# Patient Record
Sex: Male | Born: 1960
Health system: Southern US, Community
[De-identification: ages and names within clinical notes are randomized; demographics above are authoritative.]

## PROBLEM LIST (undated history)

## (undated) DIAGNOSIS — R430 Anosmia: Secondary | ICD-10-CM

## (undated) DIAGNOSIS — E669 Obesity, unspecified: Secondary | ICD-10-CM

## (undated) DIAGNOSIS — H4310 Vitreous hemorrhage, unspecified eye: Secondary | ICD-10-CM

## (undated) DIAGNOSIS — I10 Essential (primary) hypertension: Secondary | ICD-10-CM

## (undated) HISTORY — PX: NECK SURGERY: SHX720

## (undated) HISTORY — DX: Anosmia: R43.0

## (undated) HISTORY — PX: VASECTOMY: SHX75

## (undated) HISTORY — DX: Obesity, unspecified: E66.9

---
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
## 1999-05-24 ENCOUNTER — Other Ambulatory Visit: Admission: RE | Admit: 1999-05-24 | Discharge: 1999-05-24 | Payer: Self-pay | Admitting: Otolaryngology

## 1999-05-30 ENCOUNTER — Other Ambulatory Visit: Admission: RE | Admit: 1999-05-30 | Discharge: 1999-05-30 | Payer: Self-pay | Admitting: Otolaryngology

## 1999-06-27 ENCOUNTER — Ambulatory Visit (HOSPITAL_BASED_OUTPATIENT_CLINIC_OR_DEPARTMENT_OTHER): Admission: RE | Admit: 1999-06-27 | Discharge: 1999-06-27 | Payer: Self-pay | Admitting: Otolaryngology

## 2011-12-28 ENCOUNTER — Emergency Department
Admit: 2011-12-28 | Discharge: 2011-12-28 | Disposition: A | Discharge status: Home / Self Care | Payer: BC Managed Care – PPO

## 2011-12-28 ENCOUNTER — Encounter: Payer: Self-pay | Admitting: *Deleted

## 2011-12-28 ENCOUNTER — Emergency Department (INDEPENDENT_AMBULATORY_CARE_PROVIDER_SITE_OTHER)
Admission: EM | Admit: 2011-12-28 | Discharge: 2011-12-28 | Disposition: A | Discharge status: Home Health | Payer: BC Managed Care – PPO | Source: Home / Self Care | Attending: Family Medicine | Admitting: Family Medicine

## 2011-12-28 DIAGNOSIS — R05 Cough: Secondary | ICD-10-CM

## 2011-12-28 DIAGNOSIS — R059 Cough, unspecified: Secondary | ICD-10-CM

## 2011-12-28 DIAGNOSIS — R053 Chronic cough: Secondary | ICD-10-CM

## 2011-12-28 HISTORY — DX: Essential (primary) hypertension: I10

## 2011-12-28 MED ORDER — HYDROCOD POLST-CHLORPHEN POLST 10-8 MG/5ML PO LQCR: ORAL | Status: DC

## 2011-12-28 MED ORDER — PREDNISONE 10 MG PO TABS: ORAL_TABLET | ORAL | Status: DC

## 2011-12-28 MED ORDER — CLARITHROMYCIN 500 MG PO TABS: 500.0000 mg | ORAL_TABLET | Freq: Two times a day (BID) | ORAL | Status: AC

## 2011-12-28 NOTE — ED Provider Notes (Signed)
History     CSN: [id number]  Arrival date & time 12/28/11  1512   First MD Initiated Contact with Patient 12/28/11 1628      Chief Complaint  Patient presents with  . Cough     HPI Comments: Patient recalls developing nausea/vomiting and diarrhea that lasted 3 days, about 6 weeks ago.  This was followed by a cough without sore throat or nasal congestion.  He visited his PCP who started him on a Z-pack and codeine cough suppressant at bedtime.  He also added Tessalon during the day.  The patient's cough has persisted, is non-productive, and worse at night.  He has had no pleuritic pain but has tightness in the anterior chest.  He occasionally coughs until he gags.  No pleuritic pain or shortness of breath.  No lower leg edema.  He has not had a tetanus shot since 2006.  He notes that he has been taking Lisinopril for hypertension for about 9 years  Patient is a 51 y.o. male presenting with cough. The history is provided by the patient.  Cough This is a new problem. Episode onset: 6 weeks ago. The problem occurs constantly. The problem has been gradually worsening. The cough is non-productive. There has been no fever. Pertinent negatives include no chest pain, no chills, no sweats, no weight loss, no ear congestion, no ear pain, no headaches, no rhinorrhea, no sore throat, no myalgias, no shortness of breath, no wheezing and no eye redness. He has tried cough syrup for the symptoms. The treatment provided no relief. He is not a smoker.    Past Medical History  Diagnosis Date  . Hypertension     History reviewed. No pertinent past surgical history.  Family History  Problem Relation Age of Onset  . Cancer Mother     breast  . Cancer Father     prostate    History  Substance Use Topics  . Smoking status: Never Smoker   . Smokeless tobacco: Not on file  . Alcohol Use: No      Review of Systems  Constitutional: Negative for chills and weight loss.  HENT: Negative for ear pain, sore throat and rhinorrhea.   Eyes: Negative for  redness.  Respiratory: Positive for cough. Negative for shortness of breath and wheezing.   Cardiovascular: Negative for chest pain.  Musculoskeletal: Negative for myalgias.  Neurological: Negative for headaches.  All other systems reviewed and are negative.    Allergies  Review of patient's allergies indicates no known allergies.  Home Medications   Current Outpatient Rx  Name Route Sig Dispense Refill  . LISINOPRIL-HYDROCHLOROTHIAZIDE 10-12.5 MG PO TABS Oral Take 1 tablet by mouth daily.    Marland Kitchen HYDROCOD POLST-CPM POLST ER 10-8 MG/5ML PO LQCR  Take 5mL by mouth at bedtime as needed for cough 115 mL 0  . CLARITHROMYCIN 500 MG PO TABS Oral Take 1 tablet (500 mg total) by mouth 2 (two) times daily. Take for one week 14 tablet 0  . PREDNISONE 10 MG PO TABS  Take 2 tabs by mouth today, then 2 twice daily for three days, then one tab twice daily for 2 days, then 1 daily for two days.  Take PC 20 tablet 0    BP 117/83  Pulse 103  Temp(Src) 98.5 F (36.9 C) (Oral)  Resp 18  Ht 5' 8.5" (1.74 m)  Wt 249 lb 12 oz (113.286 kg)  BMI 37.42 kg/m2  SpO2 98%  Physical Exam Nursing notes and Vital Signs reviewed. Appearance:  Patient appears healthy, stated age, and in no acute distress Eyes:  Pupils are equal, round, and reactive to light and accomodation.  Extraocular movement is intact.  Conjunctivae are not inflamed  Ears:  Canals normal.  Tympanic membranes normal.  Nose:  Mildly congested turbinates.  No sinus tenderness.  Pharynx:  Normal Neck:  Supple.  No adenopathy Lungs:  Clear to auscultation.  Breath sounds are equal.  Heart:  Regular rate and rhythm without murmurs, rubs, or gallops.  Abdomen:  Nontender without masses or hepatosplenomegaly.  Bowel sounds are present.  No CVA or flank tenderness.  Extremities:  No edema.  No calf tenderness Skin:  No rash present.   ED Course  Procedures  none   Dg Chest 2 View  12/28/2011  *RADIOLOGY REPORT*  Clinical Data: 6 weeks of  cough.  CHEST - 2 VIEW  Comparison: None.  Findings: Film is made with shallow lung inflation.  Heart size is normal.  There are no focal consolidations or pleural effusions. No edema. Visualized osseous structures have a normal appearance.  IMPRESSION: No evidence for acute cardiopulmonary abnormality.  Original Report Authenticated By: Patterson Hammersmith, M.D.     1. Persistent cough; ? Pertussis; ? ACEI induced      MDM   Begin Biaxin for one week, Tussionex at bedtime, tapering course of prednisone Begin Mucinex (guaifenesin) with plenty of fluids. Followup with PCP in one week.       Donna Christen, MD 12/28/11 [phone]

## 2011-12-28 NOTE — ED Notes (Signed)
Pt c/o dry cough x 6wks.  He saw his PCP 11/13/11 and was given Zpak with no relief. He has also taken delsym, codeine cough syrup and tessalon perles with no relief. He states that he also cough up some blood x 2wks ago. He c/o cough is worse with talking.

## 2011-12-28 NOTE — Discharge Instructions (Signed)
Begin Mucinex (guaifenesin) with plenty of fluids.

## 2011-12-30 ENCOUNTER — Telehealth: Payer: Self-pay

## 2011-12-31 ENCOUNTER — Emergency Department: Admission: EM | Admit: 2011-12-31 | Discharge: 2011-12-31 | Discharge status: Absent without leave | Payer: Self-pay

## 2013-03-10 LAB — HM COLONOSCOPY: HM COLON: NORMAL

## 2015-12-28 ENCOUNTER — Telehealth: Payer: Self-pay

## 2015-12-28 NOTE — Telephone Encounter (Signed)
Reason for call: Pt says that he has a friend that is a current pt of yours. He says that he has heard really good things about you and would like to know if he can be a new pt?    Please advise.   CB: [phone]

## 2015-12-28 NOTE — Telephone Encounter (Signed)
FYI. Please inform Pt to have medical records sent before appt or to bring them w/ him to appt. Thank you.

## 2015-12-28 NOTE — Telephone Encounter (Signed)
Please arrange a office visit at his earliest convenience

## 2015-12-29 NOTE — Telephone Encounter (Signed)
Pt has been scheduled. Pt says that he can bring in medical records.

## 2016-01-04 ENCOUNTER — Other Ambulatory Visit: Payer: Self-pay

## 2016-03-07 DIAGNOSIS — B344 Papovavirus infection, unspecified: Secondary | ICD-10-CM | POA: Diagnosis not present

## 2016-04-16 ENCOUNTER — Telehealth: Payer: Self-pay

## 2016-04-16 NOTE — Telephone Encounter (Signed)
Pre visit call made requested return call.

## 2016-04-17 ENCOUNTER — Ambulatory Visit (INDEPENDENT_AMBULATORY_CARE_PROVIDER_SITE_OTHER): Payer: BLUE CROSS/BLUE SHIELD | Admitting: Internal Medicine

## 2016-04-17 ENCOUNTER — Encounter: Payer: Self-pay | Admitting: Internal Medicine

## 2016-04-17 VITALS — BP 126/80 | HR 71 | Temp 98.1°F | Ht 69.0 in | Wt 267.0 lb

## 2016-04-17 DIAGNOSIS — I10 Essential (primary) hypertension: Secondary | ICD-10-CM | POA: Diagnosis not present

## 2016-04-17 DIAGNOSIS — E669 Obesity, unspecified: Secondary | ICD-10-CM | POA: Diagnosis not present

## 2016-04-17 DIAGNOSIS — R739 Hyperglycemia, unspecified: Secondary | ICD-10-CM

## 2016-04-17 NOTE — Patient Instructions (Signed)
GO TO THE LAB : Get the blood work     GO TO THE FRONT DESK Schedule your next appointment for a  Physical exam in 2 months fasting  MYFITNESSPAL -- Great calorie counting app    Check the  blood pressure 2 or 3 times a month   Be sure your blood pressure is between 110/65 and  145/85. If it is consistently higher or lower, let me know

## 2016-04-17 NOTE — Progress Notes (Signed)
Pre visit review using our clinic review tool, if applicable. No additional management support is needed unless otherwise documented below in the visit note. 

## 2016-04-17 NOTE — Progress Notes (Signed)
Subjective:    Patient ID: Christian Ramos, male    DOB: [birth year], 55 y.o.   MRN: [id number]  DOS:  04/17/2016 Type of visit - description : New patient, to get established, previously seen at [facility] Interval history: Here to get established, several concerns Obesity: Has been obese most of his adult life, many years ago he weighed as much as 240 pounds, was able to decreased to 195 pounds with diet and exercise. Then for many years his weight went up and down. For the last 10 years steadily gaining weight. The last serious attempt to lose weight was 3 years ago with diet and exercise and he was not very  successful.  HTN: Good compliance of medications, ambulatory BPs excellent, 110, 120. Very seldom it goes ~ 150.  + Stress, work related, family life is great.  Also concerned about sleep apnea, has noted to snore, he used to be very energetic for for the last 2 years his energy has decreased.  Labs 01/15/2015: BMP normal except for a blood sugar of 118. A1c 5.3 Total cholesterol 235, TG 373, LDL 34, LDL 126 Hep C screening-2015 negative  Review of Systems   Past Medical History  Diagnosis Date  . Hypertension     Past Surgical History  Procedure Laterality Date  . Vasectomy  ~2000  . Neck surgery  ~ 2000    mass removed anteriorly, Dr Cloria Spring    Social History   Social History  . Marital Status: Married    Spouse Name: N/A  . Number of Children: 2  . Years of Education: N/A   Occupational History  . 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 Smoking status: Never Smoker   . Smokeless tobacco: Not on file  . Alcohol Use: 0.0 oz/week    0 Standard drinks or equivalent per week     Comment: Social  . Drug Use: No  . Sexual Activity: Not on file   Other Topics Concern  . Not on file   Social History Narrative   2 children, both in college     Family History  Problem Relation Age of Onset  . Breast cancer Mother    lumpectomy, XRT, chemo  . Prostate cancer Father     ~ 78 y/o  . Heart disease Father     MI   . Diabetes Mother   . Hypertension Mother       Medication List       This list is accurate as of: 04/17/16 11:59 PM.  Always use your most recent med list.               loratadine 10 MG tablet  Commonly known as:  CLARITIN  Take 10 mg by mouth daily.     losartan-hydrochlorothiazide 100-12.5 MG tablet  Commonly known as:  HYZAAR  Take 1 tablet by mouth daily.     multivitamin tablet  Take 1 tablet by mouth daily.           Objective:   Physical Exam BP 126/80 mmHg  Pulse 71  Temp(Src) 98.1 F (36.7 C) (Oral)  Ht  (1.753 m)  Wt 267 lb (121.11 kg)  BMI 39.41 kg/m2  SpO2 97%  General:   Well developed, well nourished . NAD.  Neck: No  thyromegaly . Well-healed surgical scar and tumor neck. HEENT:  Normocephalic . Face symmetric, atraumatic Lungs:  CTA B Normal respiratory effort, no intercostal  retractions, no accessory muscle use. Heart: RRR,  no murmur.  No pretibial edema bilaterally  Abdomen:  Not distended, soft, non-tender. No rebound or rigidity.   Skin: Exposed areas without rash. Not pale. Not jaundice Neurologic:  alert & oriented X3.  Speech normal, gait appropriate for age and unassisted Strength symmetric and appropriate for age.  Psych: Cognition and judgment appear intact.  Cooperative with normal attention span and concentration.  Behavior appropriate. No anxious or depressed appearing.    Assessment & Plan:   Assessment HTN  Obesity OSA?  Plan: HTN: Continue Hyzaar, seems well-controlled, check a CMP, CBC Obesity: Has been obese most of his adult life, current BMI 39.he also has evidence of sleep apnea on clinical grounds, epworht scale was 14 which is high. We'll get a TSH, A1c. Diet-- strongly encourage calorie counting. Gradual exercise program. Refer for a sleep study in the near future if unable to loose wt. Mild  hyperglycemia, see labs above, recheck a A1c + Stress, work related. Reassess on RTC. Get records from previous PCP RTC 2 months CPX  Today, I spent more than  32  min with the patient: >50% of the time counseling regards diet, exercise, risks of untreated sleep apnea.

## 2016-04-18 ENCOUNTER — Encounter: Payer: Self-pay | Admitting: Internal Medicine

## 2016-04-18 DIAGNOSIS — I10 Essential (primary) hypertension: Secondary | ICD-10-CM | POA: Insufficient documentation

## 2016-04-18 DIAGNOSIS — Z09 Encounter for follow-up examination after completed treatment for conditions other than malignant neoplasm: Secondary | ICD-10-CM | POA: Insufficient documentation

## 2016-04-18 DIAGNOSIS — E669 Obesity, unspecified: Secondary | ICD-10-CM | POA: Insufficient documentation

## 2016-04-18 LAB — COMPREHENSIVE METABOLIC PANEL
ALK PHOS: 52 U/L (ref 39–117)
ALT: 27 U/L (ref 0–53)
AST: 20 U/L (ref 0–37)
Albumin: 4.1 g/dL (ref 3.5–5.2)
BILIRUBIN TOTAL: 0.9 mg/dL (ref 0.2–1.2)
BUN: 13 mg/dL (ref 6–23)
CALCIUM: 9.2 mg/dL (ref 8.4–10.5)
CO2: 27 meq/L (ref 19–32)
CREATININE: 0.85 mg/dL (ref 0.40–1.50)
Chloride: 101 mEq/L (ref 96–112)
GFR: 99.67 mL/min (ref >60.00)
Glucose, Bld: 72 mg/dL (ref 70–99)
Potassium: 3.6 mEq/L (ref 3.5–5.1)
Sodium: 138 mEq/L (ref 135–145)
TOTAL PROTEIN: 7 g/dL (ref 6.0–8.3)

## 2016-04-18 LAB — CBC WITH DIFFERENTIAL/PLATELET
BASOS PCT: 1.1 % (ref 0.0–3.0)
Basophils Absolute: 0.1 10*3/uL (ref 0.0–0.1)
EOS ABS: 0.2 10*3/uL (ref 0.0–0.7)
Eosinophils Relative: 2.3 % (ref 0.0–5.0)
HEMATOCRIT: 44 % (ref 39.0–52.0)
Hemoglobin: 15.1 g/dL (ref 13.0–17.0)
LYMPHS PCT: 30.2 % (ref 12.0–46.0)
Lymphs Abs: 2.3 10*3/uL (ref 0.7–4.0)
MCHC: 34.3 g/dL (ref 30.0–36.0)
MCV: 90.6 fl (ref 78.0–100.0)
MONOS PCT: 8.5 % (ref 3.0–12.0)
Monocytes Absolute: 0.6 10*3/uL (ref 0.1–1.0)
NEUTROS ABS: 4.4 10*3/uL (ref 1.4–7.7)
Neutrophils Relative %: 57.9 % (ref 43.0–77.0)
PLATELETS: 279 10*3/uL (ref 150.0–400.0)
RBC: 4.86 Mil/uL (ref 4.22–5.81)
RDW: 12.8 % (ref 11.5–15.5)
WBC: 7.5 10*3/uL (ref 4.0–10.5)

## 2016-04-18 LAB — TSH: TSH: 0.91 u[IU]/mL (ref 0.35–4.50)

## 2016-04-18 LAB — HEMOGLOBIN A1C: Hgb A1c MFr Bld: 5.4 % (ref 4.6–6.5)

## 2016-04-18 NOTE — Assessment & Plan Note (Signed)
HTN: Continue Hyzaar, seems well-controlled, check a CMP, CBC Obesity: Has been obese most of his adult life, current BMI 39.he also has evidence of sleep apnea on clinical grounds, epworht scale was 14 which is high. We'll get a TSH, A1c. Diet-- strongly encourage calorie counting. Gradual exercise program. Refer for a sleep study in the near future if unable to loose wt. Mild hyperglycemia, see labs above, recheck a A1c + Stress, work related. Reassess on RTC. Get records from previous PCP RTC 2 months CPX

## 2016-04-26 ENCOUNTER — Encounter: Payer: Self-pay | Admitting: Internal Medicine

## 2016-04-26 MED ORDER — LOSARTAN POTASSIUM-HCTZ 100-12.5 MG PO TABS: 1.0000 | ORAL_TABLET | Freq: Every day | ORAL | Status: DC

## 2016-04-26 NOTE — Telephone Encounter (Signed)
Hyzaar sent to [facility].

## 2016-05-30 DIAGNOSIS — B354 Tinea corporis: Secondary | ICD-10-CM | POA: Diagnosis not present

## 2016-07-09 ENCOUNTER — Ambulatory Visit (INDEPENDENT_AMBULATORY_CARE_PROVIDER_SITE_OTHER): Payer: BLUE CROSS/BLUE SHIELD | Admitting: Internal Medicine

## 2016-07-09 ENCOUNTER — Encounter: Payer: Self-pay | Admitting: Internal Medicine

## 2016-07-09 VITALS — BP 124/80 | HR 79 | Temp 98.1°F | Resp 14 | Ht 69.0 in | Wt 265.2 lb

## 2016-07-09 DIAGNOSIS — R51 Headache: Secondary | ICD-10-CM

## 2016-07-09 DIAGNOSIS — Z0000 Encounter for general adult medical examination without abnormal findings: Secondary | ICD-10-CM | POA: Insufficient documentation

## 2016-07-09 DIAGNOSIS — R430 Anosmia: Secondary | ICD-10-CM

## 2016-07-09 DIAGNOSIS — R519 Headache, unspecified: Secondary | ICD-10-CM

## 2016-07-09 LAB — LIPID PANEL
CHOL/HDL RATIO: 5
Cholesterol: 216 mg/dL — ABNORMAL HIGH (ref 0–200)
HDL: 42 mg/dL (ref >39.00)
NONHDL: 174.39
TRIGLYCERIDES: 270 mg/dL — AB (ref 0.0–149.0)
VLDL: 54 mg/dL — AB (ref 0.0–40.0)

## 2016-07-09 LAB — PSA: PSA: 0.4 ng/mL (ref 0.10–4.00)

## 2016-07-09 LAB — LDL CHOLESTEROL, DIRECT: Direct LDL: 120 mg/dL

## 2016-07-09 NOTE — Assessment & Plan Note (Addendum)
Td 2014 Flu shot- benefits discussed, declined CCS: per [facility]  cscope 03-10-2013  (normal per pt) Prostate cancer screening: DRE normal today, + FH of prostate cancer, checking a PSA Labs reviewed, due for FLP and PSA EKG today for baseline: NSR, old anterior infarct?[facility]. We'll try again to get old records but EKG is not acute. Diet discussed

## 2016-07-09 NOTE — Progress Notes (Signed)
Subjective:    Patient ID: Christian Ramos, male    DOB: 1961/04/24, 55 y.o.   MRN: [id number]  DOS:  07/09/2016 Type of visit - description : CPX Interval history: Still concerned about his weight. Also 3-5 weeks history of a "HA": Pain starts at the right side of the neck and radiate upwards ,on and off, no associated with nausea, vomiting, light/noise intolerance. It decrease shortly after he takes a OTC. Denies any injury, pain is not triggered by head motion. Also concerned because for the last month, he has lost his sense of  smell. No recent URI.   Wt Readings from Last 3 Encounters:  07/09/16 265 lb 4 oz (120.3 kg)  04/17/16 267 lb (121.1 kg)  12/28/11 249 lb 12 oz (113.3 kg)      Review of Systems Constitutional: No fever. No chills. No unexplained wt changes. No unusual sweats  HEENT: No dental problems, no ear discharge, no facial swelling, no voice changes. No eye discharge, no eye  redness , no  intolerance to light   Respiratory: No wheezing , no  difficulty breathing. No cough , no mucus production  Cardiovascular: No CP, no leg swelling , no  Palpitations  GI: no nausea, no vomiting, no diarrhea , no  abdominal pain.  No blood in the stools. No dysphagia, no odynophagia    Endocrine: No polyphagia, no polyuria , no polydipsia  GU: No dysuria, gross hematuria, difficulty urinating. No urinary urgency, no frequency.  Musculoskeletal: No joint swellings or unusual aches or pains  Skin: No change in the color of the skin, palor , no  Rash  Allergic, immunologic: No environmental allergies , no  food allergies  Neurological: No dizziness no  syncope.   No diplopia, no slurred, no slurred speech, no motor deficits, no facial  Numbness See history of present illness Hematological: No enlarged lymph nodes, no easy bruising , no unusual bleedings  Psychiatry: No suicidal ideas, no hallucinations, no beavior problems, no confusion.  No unusual/severe anxiety,  no depression   Past Medical History:  Diagnosis Date  . Hypertension   . Obesity (BMI 30-39.9)     Past Surgical History:  Procedure Laterality Date  . NECK SURGERY  ~ 2000   mass removed anteriorly, Dr Cloria Spring  . VASECTOMY  ~2000    Social History   Social History  . Marital status: Married    Spouse name: N/A  . Number of children: 2  . Years of education: N/A   Occupational History  . 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 Smoking status: Never Smoker  . Smokeless tobacco: Never Used  . Alcohol use 0.0 oz/week     Comment: Social  . Drug use: No  . Sexual activity: Not on file   Other Topics Concern  . Not on file   Social History Narrative   2 children, both in college        Medication List       Accurate as of 07/09/16 11:59 PM. Always use your most recent med list.          loratadine 10 MG tablet Commonly known as:  CLARITIN Take 10 mg by mouth daily.   losartan-hydrochlorothiazide 100-12.5 MG tablet Commonly known as:  HYZAAR Take 1 tablet by mouth daily.   multivitamin tablet Take 1 tablet by mouth daily.          Objective:   Physical Exam BP  124/80 (BP Location: Right Arm, Patient Position: Sitting, Cuff Size: Normal)   Pulse 79   Temp 98.1 F (36.7 C) (Oral)   Resp 14   Ht 5\' 9"  (1.753 m)   Wt 265 lb 4 oz (120.3 kg)   SpO2 97%   BMI 39.17 kg/m   General:   Well developed, well nourished . NAD.  Neck: No  thyromegaly , normal carotid pulses. Full range of motion, no TTP at the cervical spine HEENT:  Normocephalic . Face symmetric, atraumatic. Nostrils normal Lungs:  CTA B Normal respiratory effort, no intercostal retractions, no accessory muscle use. Heart: RRR,  no murmur.  No pretibial edema bilaterally  Abdomen:  Not distended, soft, non-tender. No rebound or rigidity.   Skin: Exposed areas without rash. Not pale. Not jaundice Rectal:  External abnormalities: none. Normal sphincter  tone. No rectal masses or tenderness.  Stools found  Prostate: Prostate gland firm and smooth, no enlargement, nodularity, tenderness, mass, asymmetry or induration.  Neurologic:  alert & oriented X3.  Speech normal, gait appropriate for age and unassisted Strength symmetric and appropriate for age.  Psych: Cognition and judgment appear intact.  Cooperative with normal attention span and concentration.  Behavior appropriate. No anxious or depressed appearing.    Assessment & Plan:   Assessment  (transfer from [facility] 04/17/2016) HTN  Obesity OSA?  Plan: Obesity: Continue to be an issue, diet continue to be poor but he is doing very well with physical activity, ~4 times a week. I offered him a nutritionist referral, he will think about it but he is actually requesting a medication. See AVS. Headache: Neuro exam nonfocal, migraine? Occipital neuralgia? Patient is quite concerned about the concomitant headache and loss of smell. Refer to neurology RTC 6 months

## 2016-07-09 NOTE — Progress Notes (Signed)
Pre visit review using our clinic review tool, if applicable. No additional management support is needed unless otherwise documented below in the visit note. 

## 2016-07-09 NOTE — Patient Instructions (Signed)
GO TO THE LAB : Get the blood work     GO TO THE FRONT DESK Schedule your next appointment for a  routine check up in 6 months   We are referring you to a neurologist  Please do your own  research about weight loss medications: Saxenda, Belviq, Qsymia . Also discussed coverage  with your insurance . Consider a nutritionist referral  Get records from your previous doctors.

## 2016-07-10 NOTE — Assessment & Plan Note (Signed)
Obesity: Continue to be an issue, diet continue to be poor but he is doing very well with physical activity, ~4 times a week. I offered him a nutritionist referral, he will think about it but he is actually requesting a medication. See AVS. Headache: Neuro exam nonfocal, migraine? Occipital neuralgia? Patient is quite concerned about the concomitant headache and loss of smell. Refer to neurology RTC 6 months

## 2016-09-07 ENCOUNTER — Encounter: Payer: Self-pay | Admitting: Neurology

## 2016-09-07 ENCOUNTER — Ambulatory Visit (INDEPENDENT_AMBULATORY_CARE_PROVIDER_SITE_OTHER): Payer: BLUE CROSS/BLUE SHIELD | Admitting: Neurology

## 2016-09-07 VITALS — BP 146/84 | HR 74 | Ht 69.0 in | Wt 262.0 lb

## 2016-09-07 DIAGNOSIS — R51 Headache: Secondary | ICD-10-CM | POA: Diagnosis not present

## 2016-09-07 DIAGNOSIS — R519 Headache, unspecified: Secondary | ICD-10-CM

## 2016-09-07 DIAGNOSIS — R430 Anosmia: Secondary | ICD-10-CM | POA: Diagnosis not present

## 2016-09-07 DIAGNOSIS — I10 Essential (primary) hypertension: Secondary | ICD-10-CM | POA: Diagnosis not present

## 2016-09-07 DIAGNOSIS — G4486 Cervicogenic headache: Secondary | ICD-10-CM

## 2016-09-07 MED ORDER — TIZANIDINE HCL 2 MG PO TABS: 2.0000 mg | ORAL_TABLET | Freq: Four times a day (QID) | ORAL | 2 refills | Status: DC | PRN

## 2016-09-07 MED ORDER — GABAPENTIN 100 MG PO CAPS: 300.0000 mg | ORAL_CAPSULE | Freq: Two times a day (BID) | ORAL | 0 refills | Status: DC

## 2016-09-07 NOTE — Patient Instructions (Signed)
I think the lack of smell and headache are not related, but just to be sure, we ill check MRI of brain with and without contrast.  I think the headaches are likely coming from the back of the neck.  To try and reduce frequency of these headaches, I will prescribe you gabapentin 100mg .  Take 3 capsules twice daily.  Contact me in 4 weeks with update and we can adjust dose if needed.  If the medication is too strong (excessive dizziness or drowsiness), contact me and we can start at a lower dose.  When you get a headache, take ibuprofen or naproxen.  However, I will also prescribe you tizanidine 2mg  (a muscle relaxant).  May take up to every 6 hours as needed.  It may cause drowsiness, so do not drive while on it.  Follow up in 3 months.

## 2016-09-07 NOTE — Progress Notes (Signed)
NEUROLOGY CONSULTATION NOTE  Christian Ramos MRN: [id number] DOB: Jul 21, 1961  Referring provider: Dr. Drue NovelPaz Primary care provider: Dr. Drue NovelPaz  Reason for consult:  Headache, difficulty with smell  HISTORY OF PRESENT ILLNESS: Christian Ramos is a 55 year old right-handed man hypertension who presents for headache and anosmia.  History obtained by patient and PCP note.  Onset:  One year ago Location:  Starts at base of right side of head and radiates to right temporal-parietal region.  He denies pain radiating down the neck or into the arm.  When he moves his neck, he feels "crunching". Quality:  Pressure, non-throbbing. Intensity:  6-7/10 Aura:  no Prodrome:  no Associated symptoms:  Bilateral blurred vision, a little lightheaded.  No nausea, photophobia, phonophobia. Duration:  12 hours Frequency:  3 to 4 days per week Triggers/exacerbating factors:  no Relieving factors:  no Activity:  Able to still function.  Past NSAIDS:  no Past analgesics:  no Past abortive triptans:  no Past muscle relaxants:  no Past anti-emetic:  no Past antihypertensive medications:  Prinzide Past antidepressant medications:  no Past anticonvulsant medications:  no Past vitamins/Herbal/Supplements:  no Other past therapies:  no  Current NSAIDS:  Advil Current analgesics:  Tylenol Current triptans:  no Current anti-emetic:  no Current muscle relaxants:  no Current anti-anxiolytic:  no Current sleep aide:  no Current Antihypertensive medications:  Hyzaar Current Antidepressant medications:  no Current Anticonvulsant medications:  no Current Vitamins/Herbal/Supplements:  MVI Current Antihistamines/Decongestants:  Claritin Other therapy:  No  At the same time, he reports lack of sense of smell.  It is not completely gone.  Food doesn't taste well.  If he smells something strong up close (like spices), he can smell it.  He denies congestion.  Caffeine:  no Alcohol:  3 to 5 drinks a  week Smoker:  no Diet:  60 oz water Exercise:  yes Depression/stress:  no No personal history of headache. Denies head injury prior to onset of symptoms.  Labs from June include normal CBC and CMP.  PAST MEDICAL HISTORY: Past Medical History:  Diagnosis Date  . Hypertension   . Obesity (BMI 30-39.9)     PAST SURGICAL HISTORY: Past Surgical History:  Procedure Laterality Date  . NECK SURGERY  ~ 2000   mass removed anteriorly, Dr Cloria SpringWoliki  . VASECTOMY  ~2000    MEDICATIONS: Current Outpatient Prescriptions on File Prior to Visit  Medication Sig Dispense Refill  . loratadine (CLARITIN) 10 MG tablet Take 10 mg by mouth daily.    Marland Kitchen. losartan-hydrochlorothiazide (HYZAAR) 100-12.5 MG tablet Take 1 tablet by mouth daily. 90 tablet 2  . Multiple Vitamin (MULTIVITAMIN) tablet Take 1 tablet by mouth daily.     No current facility-administered medications on file prior to visit.     ALLERGIES: No Known Allergies  FAMILY HISTORY: Family History  Problem Relation Age of Onset  . Breast cancer Mother     lumpectomy, XRT, chemo  . Diabetes Mother   . Hypertension Mother   . Prostate cancer Father     ~ 54 y/o  . Heart disease Father     MI     SOCIAL HISTORY: Social History   Social History  . Marital status: Married    Spouse name: N/A  . Number of children: 2  . Years of education: N/A   Occupational History  . 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 Smoking status: Never Smoker  .  Smokeless tobacco: Never Used  . Alcohol use 0.0 oz/week     Comment: Social  . Drug use: No  . Sexual activity: Not on file   Other Topics Concern  . Not on file   Social History Narrative   2 children, both in college    REVIEW OF SYSTEMS: Constitutional: No fevers, chills, or sweats, no generalized fatigue, change in appetite Eyes: No visual changes, double vision, eye pain Ear, nose and throat: Lack of smell.  No hearing loss, ear pain, nasal  congestion, sore throat Cardiovascular: No chest pain, palpitations Respiratory:  No shortness of breath at rest or with exertion, wheezes GastrointestinaI: No nausea, vomiting, diarrhea, abdominal pain, fecal incontinence Genitourinary:  No dysuria, urinary retention or frequency Musculoskeletal:  No neck pain, back pain Integumentary: No rash, pruritus, skin lesions Neurological: as above Psychiatric: No depression, insomnia, anxiety Endocrine: No palpitations, fatigue, diaphoresis, mood swings, change in appetite, change in weight, increased thirst Hematologic/Lymphatic:  No purpura, petechiae. Allergic/Immunologic: no itchy/runny eyes, nasal congestion, recent allergic reactions, rashes  PHYSICAL EXAM: Vitals:   09/07/16 0757  BP: (!) 146/84  Pulse: 74   General: No acute distress.  Patient appears well-groomed.  Head:  Normocephalic/atraumatic Eyes:  fundi examined but not visualized Neck: supple, no paraspinal tenderness, full range of motion Back: No paraspinal tenderness Heart: regular rate and rhythm Lungs: Clear to auscultation bilaterally. Vascular: No carotid bruits. Neurological Exam: Mental status: alert and oriented to person, place, and time, recent and remote memory intact, fund of knowledge intact, attention and concentration intact, speech fluent and not dysarthric, language intact. Cranial nerves: CN I: not tested CN II: pupils equal, round and reactive to light, visual fields intact CN III, IV, VI:  full range of motion, no nystagmus, no ptosis CN V: facial sensation intact CN VII: upper and lower face symmetric CN VIII: hearing intact CN IX, X: gag intact, uvula midline CN XI: sternocleidomastoid and trapezius muscles intact CN XII: tongue midline Bulk & Tone: normal, no fasciculations. Motor:  5/5 throughout  Sensation: temperature and vibration sensation intact. Deep Tendon Reflexes:  2+ throughout, toes downgoing.  Finger to nose testing:  Without  dysmetria.  Heel to shin:  Without dysmetria.  Gait:  Normal station and stride.  Able to turn and tandem walk. Romberg negative.  IMPRESSION: 1.  It sounds like a cervicogenic headache (or possibly occipital neuralgia). 2.  Anosmia.  3.  HTN  PLAN: 1.  I think the anosmia is likely unrelated.  However, since this is a new onset headache in patient over 50, and at the same time notes new difficulty with smell, we will get MRI of brain with and without contrast. 2.  To help reduce frequency of headaches, will start gabapentin 300mg  twice daily. 3.  For acute attacks, use NSAIDs but will also prescribe tizanidine 2mg .  Advised not to drive when taking since it may cause drowsiness. 4.  BP borderline elevated.  Should be readdressed with Dr. Drue NovelPaz. 5.  Follow up in 3 months.  Thank you for allowing me to take part in the care of this patient.  Shon MilletAdam Rockie Schnoor, DO  CC:  Willow OraJose Paz, MD

## 2016-09-19 ENCOUNTER — Encounter: Payer: Self-pay | Admitting: Internal Medicine

## 2016-09-19 MED ORDER — LOSARTAN POTASSIUM-HCTZ 100-12.5 MG PO TABS: 1.0000 | ORAL_TABLET | Freq: Every day | ORAL | 0 refills | Status: DC

## 2016-09-26 ENCOUNTER — Ambulatory Visit
Admission: RE | Admit: 2016-09-26 | Discharge: 2016-09-26 | Disposition: A | Discharge status: Home / Self Care | Payer: BLUE CROSS/BLUE SHIELD | Source: Ambulatory Visit | Attending: Neurology | Admitting: Neurology

## 2016-09-26 DIAGNOSIS — R51 Headache: Principal | ICD-10-CM

## 2016-09-26 DIAGNOSIS — R430 Anosmia: Secondary | ICD-10-CM | POA: Diagnosis not present

## 2016-09-26 DIAGNOSIS — R519 Headache, unspecified: Secondary | ICD-10-CM

## 2016-09-26 MED ORDER — GADOBENATE DIMEGLUMINE 529 MG/ML IV SOLN
20.0000 mL | Freq: Once | INTRAVENOUS | Status: AC | PRN
  Administered 2016-09-26: 20 mL via INTRAVENOUS

## 2016-09-27 ENCOUNTER — Telehealth: Payer: Self-pay

## 2016-09-27 NOTE — Telephone Encounter (Signed)
-----   Message from Drema DallasAdam R Jaffe, DO sent at 09/27/2016  7:06 AM EST ----- Mri of brain is normal.  Continue current management

## 2016-09-27 NOTE — Telephone Encounter (Signed)
Released via mychart. LEft vm to make pt aware.

## 2016-10-07 ENCOUNTER — Other Ambulatory Visit: Payer: Self-pay | Admitting: Internal Medicine

## 2016-10-08 MED ORDER — LOSARTAN POTASSIUM-HCTZ 100-12.5 MG PO TABS: 1.0000 | ORAL_TABLET | Freq: Every day | ORAL | 2 refills | Status: DC

## 2016-10-23 ENCOUNTER — Ambulatory Visit (INDEPENDENT_AMBULATORY_CARE_PROVIDER_SITE_OTHER): Payer: BLUE CROSS/BLUE SHIELD | Admitting: Internal Medicine

## 2016-10-23 ENCOUNTER — Encounter: Payer: Self-pay | Admitting: Internal Medicine

## 2016-10-23 VITALS — BP 132/82 | HR 101 | Temp 98.1°F | Resp 14 | Ht 69.0 in | Wt 269.0 lb

## 2016-10-23 DIAGNOSIS — J209 Acute bronchitis, unspecified: Secondary | ICD-10-CM | POA: Diagnosis not present

## 2016-10-23 MED ORDER — ALBUTEROL SULFATE HFA 108 (90 BASE) MCG/ACT IN AERS: 2.0000 | INHALATION_SPRAY | Freq: Four times a day (QID) | RESPIRATORY_TRACT | 1 refills | Status: DC | PRN

## 2016-10-23 MED ORDER — AZITHROMYCIN 250 MG PO TABS: ORAL_TABLET | ORAL | 0 refills | Status: DC

## 2016-10-23 MED ORDER — PREDNISONE 10 MG PO TABS: ORAL_TABLET | ORAL | 0 refills | Status: DC

## 2016-10-23 NOTE — Progress Notes (Signed)
Pre visit review using our clinic review tool, if applicable. No additional management support is needed unless otherwise documented below in the visit note. 

## 2016-10-23 NOTE — Patient Instructions (Signed)
Rest, fluids , tylenol  For cough:  Take Mucinex DM twice a day as needed until better  For chest congestion, wheezing, persistent cough: Use albuterol as needed  For nasal congestion: Use OTC Nasocort or Flonase : 2 nasal sprays on each side of the nose in the morning until you feel better  Take prednisone as prescribed   Avoid decongestants such as  Pseudoephedrine or phenylephrine    Take the antibiotic as prescribed  (Zithromax)  Call if not gradually better over the next  Few  days  Call anytime if the symptoms are severe

## 2016-10-23 NOTE — Assessment & Plan Note (Signed)
Bronchitis, bronchospasm: Has no history of asthma but remembers few years ago was Rx albuterol for similar symptoms. Likely has reactive airway disease. Plan: Zithromax, prednisone, albuterol. See instructions. Call if no better.

## 2016-10-23 NOTE — Progress Notes (Signed)
Subjective:    Patient ID: Christian Ramos, male    DOB: 1961/07/25, 55 y.o.   MRN: [id number]  DOS:  10/23/2016 Type of visit - description : acute Interval history: Sx started ~ 2 weeks ago, on-off cough , Symptoms gradually got better but 3 days ago they definitely came back worse: Cough, chest congestion, wheezing. Taking Mucinex DM, Sudafed and Claritin without much help.  Review of Systems No fever chills + Sinus congestion and clear nasal discharge No nausea, vomiting. No headaches or myalgias No chest pain except anteriorly when he cough.  Past Medical History:  Diagnosis Date  . Hypertension   . Obesity (BMI 30-39.9)     Past Surgical History:  Procedure Laterality Date  . NECK SURGERY  ~ 2000   mass removed anteriorly, Dr Cloria SpringWoliki  . VASECTOMY  ~2000    Social History   Social History  . Marital status: Married    Spouse name: N/A  . Number of children: 2  . Years of education: N/A   Occupational History  . 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 Smoking status: Never Smoker  . Smokeless tobacco: Never Used  . Alcohol use 0.0 oz/week     Comment: Social  . Drug use: No  . Sexual activity: Not on file   Other Topics Concern  . Not on file   Social History Narrative   2 children, both in college      Allergies as of 10/23/2016   No Known Allergies     Medication List       Accurate as of 10/23/16  3:13 PM. Always use your most recent med list.          gabapentin 100 MG capsule Commonly known as:  NEURONTIN Take 3 capsules (300 mg total) by mouth 2 (two) times daily.   loratadine 10 MG tablet Commonly known as:  CLARITIN Take 10 mg by mouth daily.   losartan-hydrochlorothiazide 100-12.5 MG tablet Commonly known as:  HYZAAR Take 1 tablet by mouth daily.   multivitamin tablet Take 1 tablet by mouth daily.   tiZANidine 2 MG tablet Commonly known as:  ZANAFLEX Take 1 tablet (2 mg total) by mouth  every 6 (six) hours as needed for muscle spasms.          Objective:   Physical Exam BP 132/82 (BP Location: Left Arm, Patient Position: Sitting, Cuff Size: Normal)   Pulse (!) 101   Temp 98.1 F (36.7 C) (Oral)   Resp 14   Ht 5\' 9"  (1.753 m)   Wt 269 lb (122 kg)   SpO2 96%   BMI 39.72 kg/m  General:   Well developed, well nourished . NAD.  HEENT:  Normocephalic . Face symmetric, atraumatic. TMs slightly bulge but no red. Nose congested, sinuses no TTP. Throat symmetric Lungs:  Abundant  rhonchi and wheezes bilaterally, rhonchi decrease with cough. No distress. Normal respiratory effort, no intercostal retractions, no accessory muscle use. Heart: RRR,  no murmur.  No pretibial edema bilaterally  Skin: Not pale. Not jaundice Neurologic:  alert & oriented X3.  Speech normal, gait appropriate for age and unassisted Psych--  Cognition and judgment appear intact.  Cooperative with normal attention span and concentration.  Behavior appropriate. No anxious or depressed appearing.      Assessment & Plan:   Assessment  (transfer from [facility] 04/17/2016) HTN  Obesity OSA?  Plan: Bronchitis, bronchospasm: Has no history of asthma  but remembers few years ago was Rx albuterol for similar symptoms. Likely has reactive airway disease. Plan: Zithromax, prednisone, albuterol. See instructions. Call if no better.

## 2016-12-14 ENCOUNTER — Ambulatory Visit (INDEPENDENT_AMBULATORY_CARE_PROVIDER_SITE_OTHER): Payer: BLUE CROSS/BLUE SHIELD | Admitting: Neurology

## 2016-12-14 ENCOUNTER — Encounter: Payer: Self-pay | Admitting: Neurology

## 2016-12-14 VITALS — BP 130/88 | HR 77 | Ht 69.0 in | Wt 265.6 lb

## 2016-12-14 DIAGNOSIS — G4486 Cervicogenic headache: Secondary | ICD-10-CM

## 2016-12-14 DIAGNOSIS — R430 Anosmia: Secondary | ICD-10-CM | POA: Diagnosis not present

## 2016-12-14 DIAGNOSIS — R51 Headache: Secondary | ICD-10-CM | POA: Diagnosis not present

## 2016-12-14 NOTE — Progress Notes (Signed)
NEUROLOGY FOLLOW UP OFFICE NOTE  ELGIE MAZIARZ [id number]  HISTORY OF PRESENT ILLNESS: Christian Ramos is a 56 year old right-handed man hypertension who follows up for cervicogenic headache and anosmia.  UPDATE: To assess new onset headache with anosmia, MRI of brain with and without contrast was performed on 09/26/16 to rule out mass lesion or other abnormality affecting the olfactory bulbs.  It was personally reviewed and was normal.  He is not using the gabapentin because he is concerned that it is an antiseizure medication.  However, he takes the tizanidine at night, which helps.  He doesn't wake up with the headache as much.  They are still 6-7/10 intensity, up to several hours and occurs 3 days a week.  He also treats then acutely with ibuprofen or Excedrin.  Caffeine:  no Alcohol:  3 to 5 drinks a week Smoker:  no Diet:  60 oz water Exercise:  yes Depression/stress:  no No personal history of headache.  HISTORY: Onset:  One year ago Location:  Starts at base of right side of head and radiates to right temporal-parietal region.  He denies pain radiating down the neck or into the arm.  When he moves his neck, he feels "crunching". Quality:  Pressure, non-throbbing. Initial Intensity:  6-7/10 Aura:  no Prodrome:  no Associated symptoms:  Bilateral blurred vision, a little lightheaded.  No nausea, photophobia, phonophobia. Initial Duration:  12 hours Initial Frequency:  3 to 4 days per week Triggers/exacerbating factors:  no Relieving factors:  no Activity:  Able to still function.   Past NSAIDS:  no Past analgesics:  no Past abortive triptans:  no Past muscle relaxants:  no Past anti-emetic:  no Past antihypertensive medications:  Prinzide Past antidepressant medications:  no Past anticonvulsant medications:  no Past vitamins/Herbal/Supplements:  no Other past therapies:  no     At the same time, he reports lack of sense of smell.  It is not completely gone.   Food doesn't taste well.  If he smells something strong up close (like spices), he can smell it.  He denies congestion.   Denies head injury prior to onset of symptoms.  PAST MEDICAL HISTORY: Past Medical History:  Diagnosis Date  . Hypertension   . Obesity (BMI 30-39.9)     MEDICATIONS: Current Outpatient Prescriptions on File Prior to Visit  Medication Sig Dispense Refill  . loratadine (CLARITIN) 10 MG tablet Take 10 mg by mouth daily.    Marland Kitchen losartan-hydrochlorothiazide (HYZAAR) 100-12.5 MG tablet Take 1 tablet by mouth daily. 90 tablet 2   No current facility-administered medications on file prior to visit.     ALLERGIES: No Known Allergies  FAMILY HISTORY: Family History  Problem Relation Age of Onset  . Breast cancer Mother     lumpectomy, XRT, chemo  . Diabetes Mother   . Hypertension Mother   . Prostate cancer Father     ~ 96 y/o  . Heart disease Father     MI     SOCIAL HISTORY: Social History   Social History  . Marital status: Married    Spouse name: N/A  . Number of children: 2  . Years of education: N/A   Occupational History  . 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 Smoking status: Never Smoker  . Smokeless tobacco: Never Used  . Alcohol use 0.0 oz/week     Comment: Social  . Drug use: No  . Sexual activity: Not  on file   Other Topics Concern  . Not on file   Social History Narrative   2 children, both in college    REVIEW OF SYSTEMS: Constitutional: No fevers, chills, or sweats, no generalized fatigue, change in appetite Eyes: No visual changes, double vision, eye pain Ear, nose and throat: No hearing loss, ear pain, nasal congestion, sore throat Cardiovascular: No chest pain, palpitations Respiratory:  No shortness of breath at rest or with exertion, wheezes GastrointestinaI: No nausea, vomiting, diarrhea, abdominal pain, fecal incontinence Genitourinary:  No dysuria, urinary retention or  frequency Musculoskeletal:  No neck pain, back pain Integumentary: No rash, pruritus, skin lesions Neurological: as above Psychiatric: No depression, insomnia, anxiety Endocrine: No palpitations, fatigue, diaphoresis, mood swings, change in appetite, change in weight, increased thirst Hematologic/Lymphatic:  No purpura, petechiae. Allergic/Immunologic: no itchy/runny eyes, nasal congestion, recent allergic reactions, rashes  PHYSICAL EXAM: Vitals:   12/14/16 0732  BP: 130/88  Pulse: 77   General: No acute distress.  Patient appears well-groomed.  normal body habitus. Head:  Normocephalic/atraumatic Eyes:  Fundi examined but not visualized Neck: supple, right upper cervical paraspinal tenderness, full range of motion Heart:  Regular rate and rhythm Lungs:  Clear to auscultation bilaterally Back: No paraspinal tenderness Neurological Exam: alert and oriented to person, place, and time. Attention span and concentration intact, recent and remote memory intact, fund of knowledge intact.  Speech fluent and not dysarthric, language intact.  CN II-XII intact. Bulk and tone normal, muscle strength 5/5 throughout.  Sensation to light touch  intact.  Deep tendon reflexes 2+ throughout.  Finger to nose testing intact.  Gait normal  IMPRESSION: Cervicogenic headache  PLAN: 1.  Since he would rather avoid daily preventative medications, I think treatment of his neck with osteopathic manipulative therapy would be most beneficial.  I will refer him to Dr. Antoine PrimasZachary Smith. 2.  He will continue tizanidine as needed at bedtime and may use ibuprofen. 3.  He may follow up with me as needed.  Shon MilletAdam Eilene Voigt, DO  CC: Willow OraJose Paz, MD

## 2016-12-14 NOTE — Patient Instructions (Signed)
1.  Continue taking the tizanidine at night as needed 2.  We will refer you to Dr. Antoine PrimasZachary Smith, a Sports Medicine doctor who treats neck and spine problems.

## 2017-01-01 ENCOUNTER — Encounter: Payer: Self-pay | Admitting: Internal Medicine

## 2017-01-02 MED ORDER — LOSARTAN POTASSIUM-HCTZ 100-12.5 MG PO TABS: 1.0000 | ORAL_TABLET | Freq: Every day | ORAL | 0 refills | Status: DC

## 2017-01-02 MED ORDER — LOSARTAN POTASSIUM-HCTZ 100-12.5 MG PO TABS: 1.0000 | ORAL_TABLET | Freq: Every day | ORAL | 2 refills | Status: DC

## 2017-01-02 NOTE — Addendum Note (Signed)
Addended byConrad Astatula: Alahia Whicker D on: 01/02/2017 08:30 AM   Modules accepted: Orders

## 2017-01-08 ENCOUNTER — Ambulatory Visit: Payer: BLUE CROSS/BLUE SHIELD | Admitting: Internal Medicine

## 2017-01-18 ENCOUNTER — Ambulatory Visit (INDEPENDENT_AMBULATORY_CARE_PROVIDER_SITE_OTHER): Payer: BLUE CROSS/BLUE SHIELD | Admitting: Internal Medicine

## 2017-01-18 ENCOUNTER — Encounter: Payer: Self-pay | Admitting: Internal Medicine

## 2017-01-18 VITALS — BP 136/80 | HR 84 | Temp 98.2°F | Resp 14 | Ht 69.0 in | Wt 264.2 lb

## 2017-01-18 DIAGNOSIS — R51 Headache: Secondary | ICD-10-CM

## 2017-01-18 DIAGNOSIS — J45990 Exercise induced bronchospasm: Secondary | ICD-10-CM | POA: Diagnosis not present

## 2017-01-18 DIAGNOSIS — I10 Essential (primary) hypertension: Secondary | ICD-10-CM | POA: Diagnosis not present

## 2017-01-18 DIAGNOSIS — M542 Cervicalgia: Secondary | ICD-10-CM

## 2017-01-18 DIAGNOSIS — G4486 Cervicogenic headache: Secondary | ICD-10-CM | POA: Insufficient documentation

## 2017-01-18 DIAGNOSIS — R0683 Snoring: Secondary | ICD-10-CM | POA: Diagnosis not present

## 2017-01-18 LAB — BASIC METABOLIC PANEL
BUN: 14 mg/dL (ref 6–23)
CO2: 26 mEq/L (ref 19–32)
CREATININE: 1.04 mg/dL (ref 0.40–1.50)
Calcium: 8.8 mg/dL (ref 8.4–10.5)
Chloride: 107 mEq/L (ref 96–112)
GFR: 78.75 mL/min (ref >60.00)
Glucose, Bld: 117 mg/dL — ABNORMAL HIGH (ref 70–99)
Potassium: 4.1 mEq/L (ref 3.5–5.1)
Sodium: 141 mEq/L (ref 135–145)

## 2017-01-18 MED ORDER — ALBUTEROL SULFATE HFA 108 (90 BASE) MCG/ACT IN AERS: 2.0000 | INHALATION_SPRAY | Freq: Four times a day (QID) | RESPIRATORY_TRACT | 1 refills | Status: DC | PRN

## 2017-01-18 MED ORDER — TIZANIDINE HCL 2 MG PO TABS: 2.0000 mg | ORAL_TABLET | Freq: Four times a day (QID) | ORAL | 2 refills | Status: DC | PRN

## 2017-01-18 NOTE — Progress Notes (Signed)
Pre visit review using our clinic review tool, if applicable. No additional management support is needed unless otherwise documented below in the visit note. 

## 2017-01-18 NOTE — Assessment & Plan Note (Signed)
HTN: Well-controlled, continue Hyzaar, check a BMP Headache, anosmia: Since the last visit saw neurology, MRI was negative, headache felt to be cervicogenic. Pt declined gabapentin, currently satisfied with zanaflex-naproxen prn. Was recommended   PT  Plan: Refill muscle relaxants, PT referral (eventually declined d/t cost) OSA?: Feeling better after he started to eat healthier and exercise. Energy is okay, not feeling sleeping, still snoring (less?) Exercise  induced asthma: Noted cough wheezing at the end of running, likely exercise-induced asthma, refill albuterol, to use preemptively if needed RTC 9-18, CPX

## 2017-01-18 NOTE — Patient Instructions (Signed)
GO TO THE LAB : Get the blood work     GO TO THE FRONT DESK Schedule your next appointment for a  physical exam by 06-2017

## 2017-01-18 NOTE — Progress Notes (Signed)
Subjective:    Patient ID: Christian Ramos, male    DOB: [birth year], 56 y.o.   MRN: [id number]  DOS:  01/18/2017 Type of visit - description : Routine checkup Interval history: HTN: Good med compliance, no apparent side effects Headache, anosmia: Saw neurology, chart reviewed, currently taking prn msk relaxants and Naproxen and is satisfied with the treatment. Obesity: Exercising 4 times a week, trying to do better with diet, has lost few pounds. Feels better. Also, not these that whenever he goes taking a long walk or jogging, he finish with cough and wheezing. He wonders about exercise-induced asthma. I agree. Wt Readings from Last 3 Encounters:  01/18/17 264 lb 4 oz (119.9 kg)  12/14/16 265 lb 9 oz (120.5 kg)  10/23/16 269 lb (122 kg)     Review of Systems Since he changed his lifestyle is more energetic, denies feeling sleepy. He is still snoring but apparently less than before.   Past Medical History:  Diagnosis Date  . Hypertension   . Obesity (BMI 30-39.9)     Past Surgical History:  Procedure Laterality Date  . NECK SURGERY  ~ 2000   mass removed anteriorly, Dr Cloria Spring  . VASECTOMY  ~2000    Social History   Social History  . Marital status: Married    Spouse name: N/A  . Number of children: 2  . Years of education: N/A   Occupational History  . 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 Smoking status: Never Smoker  . Smokeless tobacco: Never Used  . Alcohol use 0.0 oz/week     Comment: Social  . Drug use: No  . Sexual activity: Not on file   Other Topics Concern  . Not on file   Social History Narrative   2 children, both in college      Allergies as of 01/18/2017   No Known Allergies     Medication List       Accurate as of 01/18/17  5:13 PM. Always use your most recent med list.          albuterol 108 (90 Base) MCG/ACT inhaler Commonly known as:  VENTOLIN HFA Inhale 2 puffs into the lungs every 6 (six)  hours as needed for wheezing or shortness of breath.   loratadine 10 MG tablet Commonly known as:  CLARITIN Take 10 mg by mouth daily.   losartan-hydrochlorothiazide 100-12.5 MG tablet Commonly known as:  HYZAAR Take 1 tablet by mouth daily.   tiZANidine 2 MG tablet Commonly known as:  ZANAFLEX Take 1 tablet (2 mg total) by mouth every 6 (six) hours as needed for muscle spasms.          Objective:   Physical Exam BP 136/80 (BP Location: Left Arm, Patient Position: Sitting, Cuff Size: Normal)   Pulse 84   Temp 98.2 F (36.8 C) (Oral)   Resp 14   Ht 5\' 9"  (1.753 m)   Wt 264 lb 4 oz (119.9 kg)   SpO2 97%   BMI 39.02 kg/m  General:   Well developed, well nourished . NAD.  HEENT:  Normocephalic . Face symmetric, atraumatic Lungs:  CTA B Normal respiratory effort, no intercostal retractions, no accessory muscle use. Heart: RRR,  no murmur.  No pretibial edema bilaterally  Skin: Not pale. Not jaundice Neurologic:  alert & oriented X3.  Speech normal, gait appropriate for age and unassisted Psych--  Cognition and judgment appear intact.  Cooperative with normal  attention span and concentration.  Behavior appropriate. No anxious or depressed appearing.      Assessment & Plan:   Assessment  (transfer from [facility] 04/17/2016) HTN  Obesity Exercise  induced asthma OSA? HA-anosmia: saw neuro, last OV 11-2016; MRI (-), likely cervicogenic HA, pt declined Gabapentin, RX muscle relaxants, PT  Plan: HTN: Well-controlled, continue Hyzaar, check a BMP Headache, anosmia: Since the last visit saw neurology, MRI was negative, headache felt to be cervicogenic. Pt declined gabapentin, currently satisfied with zanaflex-naproxen prn. Was recommended   PT  Plan: Refill muscle relaxants, PT referral (eventually declined d/t cost) OSA?: Feeling better after he started to eat healthier and exercise. Energy is okay, not feeling sleeping, still snoring (less?) Exercise  induced  asthma: Noted cough wheezing at the end of running, likely exercise-induced asthma, refill albuterol, to use preemptively if needed RTC 9-18, CPX

## 2017-03-06 DIAGNOSIS — B354 Tinea corporis: Secondary | ICD-10-CM | POA: Diagnosis not present

## 2017-05-08 DIAGNOSIS — B354 Tinea corporis: Secondary | ICD-10-CM | POA: Diagnosis not present

## 2017-05-08 DIAGNOSIS — Z85828 Personal history of other malignant neoplasm of skin: Secondary | ICD-10-CM | POA: Diagnosis not present

## 2017-05-08 DIAGNOSIS — L570 Actinic keratosis: Secondary | ICD-10-CM | POA: Diagnosis not present

## 2017-05-08 DIAGNOSIS — Z08 Encounter for follow-up examination after completed treatment for malignant neoplasm: Secondary | ICD-10-CM | POA: Diagnosis not present

## 2017-07-10 ENCOUNTER — Ambulatory Visit (INDEPENDENT_AMBULATORY_CARE_PROVIDER_SITE_OTHER): Payer: BLUE CROSS/BLUE SHIELD | Admitting: Internal Medicine

## 2017-07-10 ENCOUNTER — Encounter: Payer: Self-pay | Admitting: Internal Medicine

## 2017-07-10 VITALS — BP 122/84 | HR 58 | Temp 98.8°F | Resp 14 | Ht 69.0 in | Wt 267.4 lb

## 2017-07-10 DIAGNOSIS — Z0000 Encounter for general adult medical examination without abnormal findings: Secondary | ICD-10-CM | POA: Diagnosis not present

## 2017-07-10 LAB — COMPREHENSIVE METABOLIC PANEL
ALT: 28 U/L (ref 0–53)
AST: 19 U/L (ref 0–37)
Albumin: 4 g/dL (ref 3.5–5.2)
Alkaline Phosphatase: 53 U/L (ref 39–117)
BILIRUBIN TOTAL: 0.7 mg/dL (ref 0.2–1.2)
BUN: 18 mg/dL (ref 6–23)
CALCIUM: 9.4 mg/dL (ref 8.4–10.5)
CO2: 28 meq/L (ref 19–32)
Chloride: 102 mEq/L (ref 96–112)
Creatinine, Ser: 0.95 mg/dL (ref 0.40–1.50)
GFR: 87.27 mL/min (ref >60.00)
Glucose, Bld: 101 mg/dL — ABNORMAL HIGH (ref 70–99)
Potassium: 4.1 mEq/L (ref 3.5–5.1)
Sodium: 138 mEq/L (ref 135–145)
Total Protein: 6.8 g/dL (ref 6.0–8.3)

## 2017-07-10 LAB — LIPID PANEL
Cholesterol: 186 mg/dL (ref 0–200)
HDL: 51.6 mg/dL (ref >39.00)
NONHDL: 134.57
TRIGLYCERIDES: 201 mg/dL — AB (ref 0.0–149.0)
Total CHOL/HDL Ratio: 4
VLDL: 40.2 mg/dL — ABNORMAL HIGH (ref 0.0–40.0)

## 2017-07-10 LAB — HEMOGLOBIN A1C: Hgb A1c MFr Bld: 5.7 % (ref 4.6–6.5)

## 2017-07-10 LAB — LDL CHOLESTEROL, DIRECT: Direct LDL: 90 mg/dL

## 2017-07-10 MED ORDER — LOSARTAN POTASSIUM-HCTZ 100-12.5 MG PO TABS: 1.0000 | ORAL_TABLET | Freq: Every day | ORAL | 3 refills | Status: DC

## 2017-07-10 MED ORDER — ALBUTEROL SULFATE HFA 108 (90 BASE) MCG/ACT IN AERS: 2.0000 | INHALATION_SPRAY | Freq: Four times a day (QID) | RESPIRATORY_TRACT | 12 refills | Status: DC | PRN

## 2017-07-10 NOTE — Progress Notes (Signed)
Pre visit review using our clinic review tool, if applicable. No additional management support is needed unless otherwise documented below in the visit note. 

## 2017-07-10 NOTE — Assessment & Plan Note (Addendum)
-  Td 2014; Flu shot recommended -CCS: per [facility]  cscope 03-10-2013  (normal per pt) -Prostate cancer screening:   + FH of prostate cancer,normal DRE PSA 2017 -Labs: CMP, FLP, A1c, HIV, hep C. -Diet discussed. Calorie counting? Extensive discussion. He remains active

## 2017-07-10 NOTE — Progress Notes (Signed)
Subjective:    Patient ID: Christian Ramos, male    DOB: August 30, 1961, 56 y.o.   MRN: [id number]  DOS:  07/10/2017 Type of visit - description : cpx Interval history: In general feeling well. Somewhat frustrated about not able to lose weight.   Review of Systems Developed diarrhea today at 1 AM in the morning, several episodes, watery stools with no blood. No nausea, vomiting, fever. A person at his office had similar symptoms. He feels better now. No travels except went to [place] last week. Continue with headaches as before, continue with anosmia.   Other than above, a 14 point review of systems is negative     Past Medical History:  Diagnosis Date  . Hypertension   . Obesity (BMI 30-39.9)     Past Surgical History:  Procedure Laterality Date  . NECK SURGERY  ~ 2000   mass removed anteriorly, Dr Cloria SpringWoliki  . VASECTOMY  ~2000    Social History   Social History  . Marital status: Married    Spouse name: N/A  . Number of children: 2  . Years of education: N/A   Occupational History  . 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 Smoking status: Never Smoker  . Smokeless tobacco: Never Used  . Alcohol use 0.0 oz/week     Comment: Social  . Drug use: No  . Sexual activity: Not on file   Other Topics Concern  . Not on file   Social History Narrative   2 daughters, one married lives in [place], the other is a Holiday representativesenior in college     Family History  Problem Relation Age of Onset  . Breast cancer Mother        lumpectomy, XRT, chemo  . Diabetes Mother   . Hypertension Mother   . Prostate cancer Father        ~ 55 y/o  . Heart disease Father        MI   . Colon cancer Neg Hx     Allergies as of 07/10/2017   No Known Allergies     Medication List       Accurate as of 07/10/17 11:59 PM. Always use your most recent med list.          albuterol 108 (90 Base) MCG/ACT inhaler Commonly known as:  VENTOLIN HFA Inhale 2 puffs into the  lungs every 6 (six) hours as needed for wheezing or shortness of breath.   loratadine 10 MG tablet Commonly known as:  CLARITIN Take 10 mg by mouth daily.   losartan-hydrochlorothiazide 100-12.5 MG tablet Commonly known as:  HYZAAR Take 1 tablet by mouth daily.   tiZANidine 2 MG tablet Commonly known as:  ZANAFLEX Take 1 tablet (2 mg total) by mouth every 6 (six) hours as needed for muscle spasms.            Discharge Care Instructions        Start     Ordered   07/10/17 1037  LDL cholesterol, direct     07/10/17 1037   07/10/17 0000  Comprehensive metabolic panel     07/10/17 1032   07/10/17 0000  Lipid panel     07/10/17 1032   07/10/17 0000  Hemoglobin A1c     07/10/17 1032   07/10/17 0000  HIV antibody     07/10/17 1032   07/10/17 0000  Hepatitis C antibody     07/10/17 1032  07/10/17 0000  albuterol (VENTOLIN HFA) 108 (90 Base) MCG/ACT inhaler  Every 6 hours PRN     07/10/17 1037   07/10/17 0000  losartan-hydrochlorothiazide (HYZAAR) 100-12.5 MG tablet  Daily     07/10/17 1037         Objective:   Physical Exam BP 122/84 (BP Location: Left Arm, Patient Position: Sitting, Cuff Size: Normal)   Pulse (!) 58   Temp 98.8 F (37.1 C) (Oral)   Resp 14   Ht  (1.753 m)   Wt 267 lb 6 oz (121.3 kg)   SpO2 98%   BMI 39.48 kg/m   General:   Well developed,  overweight appearing . NAD.  Neck: No  thyromegaly  HEENT:  Normocephalic . Face symmetric, atraumatic Lungs:  CTA B Normal respiratory effort, no intercostal retractions, no accessory muscle use. Heart: RRR,  no murmur.  No pretibial edema bilaterally  Abdomen:  Not distended, soft, non-tender. No rebound or rigidity.   Skin: Exposed areas without rash. Not pale. Not jaundice Neurologic:  alert & oriented X3.  Speech normal, gait appropriate for age and unassisted Strength symmetric and appropriate for age.  Psych: Cognition and judgment appear intact.  Cooperative with normal attention  span and concentration.  Behavior appropriate. No anxious or depressed appearing.    Assessment & Plan:   Assessment  (transfer from [facility] 04/17/2016) HTN  Obesity Exercise  induced asthma OSA? HA-anosmia: saw neuro, last OV 11-2016; MRI (-), likely cervicogenic HA, pt declined Gabapentin, RX muscle relaxants, PT  Plan: HTN: Will refill Hyzaar, BP is control. Headache, anosmia: Sxs ~ the same but HA respond to Excedrin and Zanaflex. Rec  to call me or neurology if the sxs get worse or different. Exercise-induced asthma: Very good response to albuterol preexercise. Refill sent. Diarrhea: Started at 1 AM today, feeling better now. Likely a self-limited illness. Recommend to call if not improving RTC one year

## 2017-07-10 NOTE — Patient Instructions (Signed)
GO TO THE LAB : Get the blood work     GO TO THE FRONT DESK Schedule your next appointment for a  physical exam in one yearc   Check the  blood pressure 2 or 3 times a month  Be sure your blood pressure is between 110/65 and  145/85. If it is consistently higher or lower, let me know  Consider calorie counting with MYFITNESSPAL

## 2017-07-11 LAB — HEPATITIS C ANTIBODY
HEP C AB: NONREACTIVE
SIGNAL TO CUT-OFF: 0.01 (ref <1.00)

## 2017-07-11 LAB — HIV ANTIBODY (ROUTINE TESTING W REFLEX): HIV 1&2 Ab, 4th Generation: NONREACTIVE

## 2017-07-11 NOTE — Assessment & Plan Note (Signed)
HTN: Will refill Hyzaar, BP is control. Headache, anosmia: Sxs ~ the same but HA respond to Excedrin and Zanaflex. Rec  to call me or neurology if the sxs get worse or different. Exercise-induced asthma: Very good response to albuterol preexercise. Refill sent. Diarrhea: Started at 1 AM today, feeling better now. Likely a self-limited illness. Recommend to call if not improving RTC one year

## 2017-08-07 ENCOUNTER — Ambulatory Visit (INDEPENDENT_AMBULATORY_CARE_PROVIDER_SITE_OTHER): Payer: BLUE CROSS/BLUE SHIELD | Admitting: Internal Medicine

## 2017-08-07 ENCOUNTER — Encounter: Payer: Self-pay | Admitting: Internal Medicine

## 2017-08-07 VITALS — BP 126/78 | HR 76 | Temp 98.2°F | Resp 14 | Ht 69.0 in | Wt 266.2 lb

## 2017-08-07 DIAGNOSIS — J40 Bronchitis, not specified as acute or chronic: Secondary | ICD-10-CM | POA: Diagnosis not present

## 2017-08-07 DIAGNOSIS — J45990 Exercise induced bronchospasm: Secondary | ICD-10-CM

## 2017-08-07 DIAGNOSIS — R05 Cough: Secondary | ICD-10-CM | POA: Diagnosis not present

## 2017-08-07 DIAGNOSIS — R059 Cough, unspecified: Secondary | ICD-10-CM

## 2017-08-07 MED ORDER — AMOXICILLIN 875 MG PO TABS: 875.0000 mg | ORAL_TABLET | Freq: Two times a day (BID) | ORAL | 0 refills | Status: DC

## 2017-08-07 MED ORDER — BENZONATATE 100 MG PO CAPS: 100.0000 mg | ORAL_CAPSULE | Freq: Three times a day (TID) | ORAL | 0 refills | Status: DC | PRN

## 2017-08-07 MED ORDER — METHYLPREDNISOLONE SODIUM SUCC 125 MG IJ SOLR
125.0000 mg | Freq: Once | INTRAMUSCULAR | Status: AC
  Administered 2017-08-07: 125 mg via INTRAMUSCULAR

## 2017-08-07 NOTE — Progress Notes (Signed)
Pre visit review using our clinic review tool, if applicable. No additional management support is needed unless otherwise documented below in the visit note. 

## 2017-08-07 NOTE — Patient Instructions (Addendum)
Rest, fluids , tylenol  For cough:   Take Mucinex DM twice a day as needed until better You can also take 1 or 2 Tessalon Perles up to 3 times a day as needed  If nasal congestion: Use OTC Nasocort or Flonase : 2 nasal sprays on each side of the nose in the morning until you feel better  Avoid decongestants such as  Pseudoephedrine or phenylephrine   Take the antibiotic as prescribed  (Amoxicillin)  Call if not gradually better over the next  10 days  Call anytime if the symptoms are severe  You just got  a shot of steroids, may make your sleep difficult, okay to take Tylenol PM if needed

## 2017-08-07 NOTE — Progress Notes (Signed)
Subjective:    Patient ID: Christian Ramos, male    DOB: 04/19/1961, 56 y.o.   MRN: [id number]  DOS:  08/07/2017 Type of visit - description : acute Interval history: Symptoms started 3 days ago with sore throat, cough, malaise. Cough is getting worse, unable to sleep well due to symptoms. Has  hear some wheezing, has an albuterol inhaler which helps.  Review of Systems  denies fever chills No chest pain or difficulty breathing No nausea vomiting or generalized aches and pains. Very mild sputum production mostly in the mornings.  Past Medical History:  Diagnosis Date  . Hypertension   . Obesity (BMI 30-39.9)     Past Surgical History:  Procedure Laterality Date  . NECK SURGERY  ~ 2000   mass removed anteriorly, Dr Cloria Spring  . VASECTOMY  ~2000    Social History   Social History  . Marital status: Married    Spouse name: N/A  . Number of children: 2  . Years of education: N/A   Occupational History  . 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 Smoking status: Never Smoker  . Smokeless tobacco: Never Used  . Alcohol use 0.0 oz/week     Comment: Social  . Drug use: No  . Sexual activity: Not on file   Other Topics Concern  . Not on file   Social History Narrative   2 daughters, one married lives in [place], the other is a senior in college      Allergies as of 08/07/2017   No Known Allergies     Medication List       Accurate as of 08/07/17 11:59 PM. Always use your most recent med list.          albuterol 108 (90 Base) MCG/ACT inhaler Commonly known as:  VENTOLIN HFA Inhale 2 puffs into the lungs every 6 (six) hours as needed for wheezing or shortness of breath.   amoxicillin 875 MG tablet Commonly known as:  AMOXIL Take 1 tablet (875 mg total) by mouth 2 (two) times daily.   benzonatate 100 MG capsule Commonly known as:  TESSALON Take 1-2 capsules (100-200 mg total) by mouth 3 (three) times daily as needed for  cough.   loratadine 10 MG tablet Commonly known as:  CLARITIN Take 10 mg by mouth daily.   losartan-hydrochlorothiazide 100-12.5 MG tablet Commonly known as:  HYZAAR Take 1 tablet by mouth daily.   tiZANidine 2 MG tablet Commonly known as:  ZANAFLEX Take 1 tablet (2 mg total) by mouth every 6 (six) hours as needed for muscle spasms.          Objective:   Physical Exam BP 126/78 (BP Location: Left Arm, Patient Position: Sitting, Cuff Size: Normal)   Pulse 76   Temp 98.2 F (36.8 C) (Oral)   Resp 14   Ht  (1.753 m)   Wt 266 lb 4 oz (120.8 kg)   SpO2 96%   BMI 39.32 kg/m  General:   Well developed, well nourished . NAD.  HEENT:  Normocephalic . Face symmetric, atraumatic. TMs slightly bulge, throat is slightly red. Nose not congested. Lungs:  Mild increase expiratory time with cough. Few rhonchi. Normal respiratory effort, no intercostal retractions, no accessory muscle use. Heart: RRR,  no murmur.  No pretibial edema bilaterally  Skin: Not pale. Not jaundice Neurologic:  alert & oriented X3.  Speech normal, gait appropriate for age and unassisted Psych--  Cognition  and judgment appear intact.  Cooperative with normal attention span and concentration.  Behavior appropriate. No anxious or depressed appearing.      Assessment & Plan:    Assessment  (transfer from [facility] 04/17/2016) HTN  Obesity Exercise  induced asthma OSA? HA-anosmia: saw neuro, last OV 11-2016; MRI (-), likely cervicogenic HA, pt declined Gabapentin, RX muscle relaxants, PT  Plan: Bronchitis: Has bronchitis with a background of exercise-induced asthma, with some bronchospasm by history. Strongly request something IM as he is taking a vacation starting tomorrow. Plan: Solu-Medrol 125 mg IM, amoxicillin, albuterol as needed, Mucinex DM, Tessalon Perles.To see a provider if not improving

## 2017-08-08 NOTE — Assessment & Plan Note (Signed)
Bronchitis: Has bronchitis with a background of exercise-induced asthma, with some bronchospasm by history. Strongly request something IM as he is taking a vacation starting tomorrow. Plan: Solu-Medrol 125 mg IM, amoxicillin, albuterol as needed, Mucinex DM, Tessalon Perles.To see a provider if not improving

## 2017-09-26 ENCOUNTER — Ambulatory Visit (INDEPENDENT_AMBULATORY_CARE_PROVIDER_SITE_OTHER): Payer: BLUE CROSS/BLUE SHIELD | Admitting: Internal Medicine

## 2017-09-26 ENCOUNTER — Encounter: Payer: Self-pay | Admitting: Internal Medicine

## 2017-09-26 VITALS — BP 132/78 | HR 81 | Temp 98.1°F | Resp 14 | Ht 69.0 in | Wt 267.1 lb

## 2017-09-26 DIAGNOSIS — J45990 Exercise induced bronchospasm: Secondary | ICD-10-CM | POA: Diagnosis not present

## 2017-09-26 MED ORDER — ALBUTEROL SULFATE HFA 108 (90 BASE) MCG/ACT IN AERS: 2.0000 | INHALATION_SPRAY | Freq: Four times a day (QID) | RESPIRATORY_TRACT | 6 refills | Status: DC | PRN

## 2017-09-26 MED ORDER — BUDESONIDE-FORMOTEROL FUMARATE 160-4.5 MCG/ACT IN AERO: 2.0000 | INHALATION_SPRAY | Freq: Two times a day (BID) | RESPIRATORY_TRACT | 3 refills | Status: DC

## 2017-09-26 NOTE — Progress Notes (Signed)
Pre visit review using our clinic review tool, if applicable. No additional management support is needed unless otherwise documented below in the visit note. 

## 2017-09-26 NOTE — Patient Instructions (Signed)
Start Symbicort 2 puffs daily I's a day  Mucinex DM as needed for cough and mucus production  Continue using albuterol, preexercise and also as a "rescue inhaler".  Flonase 2 sprays on each side of the nose daily  Call if not better  Call in 2 or  3 months, if you are better, will switch Symbicort to Qvar (Qvar has only one medication on it and is a stepdown).

## 2017-09-26 NOTE — Progress Notes (Signed)
Subjective:    Patient ID: Christian Ramos, male    DOB: [birth year], 56 y.o.   MRN: [id number]  DOS:  09/26/2017 Type of visit - description : acute Interval history: Was seen with asthma exacerbation approximately 7 weeks ago, had a steroids, antibiotics, got better but has persisting cough. Cough is associated with wheezing, chest tightness and some DOE.  Review of Systems  Denies chest pain In the mornings he has mucus accumulated in the throat that he coughs up.  Green in color. No chest pain No GERD symptoms Mild sinus congestion.  Past Medical History:  Diagnosis Date  . Hypertension   . Obesity (BMI 30-39.9)     Past Surgical History:  Procedure Laterality Date  . NECK SURGERY  ~ 2000   mass removed anteriorly, Dr Cloria SpringWoliki  . VASECTOMY  ~2000    Social History   Socioeconomic History  . Marital status: Married    Spouse name: Not on file  . Number of children: 2  . Years of education: Not on file  . Highest education level: Not on file  Social Needs  . Financial resource strain: Not on file  . Food insecurity - worry: Not on file  . Food insecurity - inability: Not on file  . Transportation needs - medical: Not on file  . Transportation needs - non-medical: Not on file  Occupational History  . 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 Smoking status: Never Smoker  . Smokeless tobacco: Never Used  Substance and Sexual Activity  . Alcohol use: Yes    Alcohol/week: 0.0 oz    Comment: Social  . Drug use: No  . Sexual activity: Not on file  Other Topics Concern  . Not on file  Social History Narrative   2 daughters, one married lives in [place], the other is a senior in college      Allergies as of 09/26/2017   No Known Allergies     Medication List        Accurate as of 09/26/17 11:59 PM. Always use your most recent med list.          albuterol 108 (90 Base) MCG/ACT inhaler Commonly known as:  VENTOLIN HFA Inhale 2  puffs into the lungs every 6 (six) hours as needed for wheezing or shortness of breath.   budesonide-formoterol 160-4.5 MCG/ACT inhaler Commonly known as:  SYMBICORT Inhale 2 puffs into the lungs 2 (two) times daily.   loratadine 10 MG tablet Commonly known as:  CLARITIN Take 10 mg by mouth daily.   losartan-hydrochlorothiazide 100-12.5 MG tablet Commonly known as:  HYZAAR Take 1 tablet by mouth daily.   tiZANidine 2 MG tablet Commonly known as:  ZANAFLEX Take 1 tablet (2 mg total) by mouth every 6 (six) hours as needed for muscle spasms.          Objective:   Physical Exam BP 132/78 (BP Location: Left Arm, Patient Position: Sitting, Cuff Size: Normal)   Pulse 81   Temp 98.1 F (36.7 C) (Oral)   Resp 14   Ht 5\' 9"  (1.753 m)   Wt 267 lb 2 oz (121.2 kg)   SpO2 96%   BMI 39.45 kg/m   General:   Well developed, well nourished . NAD.  HEENT:  Normocephalic . Face symmetric, atraumatic Lungs:  CTA B Normal respiratory effort, no intercostal retractions, no accessory muscle use. Heart: RRR,  no murmur.  No pretibial edema bilaterally  Skin: Not  pale. Not jaundice Neurologic:  alert & oriented X3.  Speech normal, gait appropriate for age and unassisted Psych--  Cognition and judgment appear intact.  Cooperative with normal attention span and concentration.  Behavior appropriate. No anxious or depressed appearing.      Assessment & Plan:    Assessment  (transfer from [facility] 04/17/2016) HTN  Obesity Exercise  induced asthma OSA? HA-anosmia: saw neuro, last OV 11-2016; MRI (-), likely cervicogenic HA, pt declined Gabapentin, RX muscle relaxants, PT  Plan: Asthma: Previously well controlled with as needed albuterol, now worse after bronchitis 7 weeks ago. Plan: Symbicort times 2-3 months (cupon provided) , if better then stepdown to Qvar.  Continue rescue albuterol. Has some DOE, if that is not improving he will let me know.  See AVS.

## 2017-09-27 NOTE — Assessment & Plan Note (Signed)
Asthma: Previously well controlled with as needed albuterol, now worse after bronchitis 7 weeks ago. Plan: Symbicort times 2-3 months (cupon provided) , if better then stepdown to Qvar.  Continue rescue albuterol. Has some DOE, if that is not improving he will let me know.  See AVS.

## 2017-10-30 DIAGNOSIS — L720 Epidermal cyst: Secondary | ICD-10-CM | POA: Diagnosis not present

## 2017-10-30 DIAGNOSIS — B354 Tinea corporis: Secondary | ICD-10-CM | POA: Diagnosis not present

## 2017-11-11 DIAGNOSIS — L570 Actinic keratosis: Secondary | ICD-10-CM | POA: Diagnosis not present

## 2017-11-11 DIAGNOSIS — L918 Other hypertrophic disorders of the skin: Secondary | ICD-10-CM | POA: Diagnosis not present

## 2017-11-11 DIAGNOSIS — L720 Epidermal cyst: Secondary | ICD-10-CM | POA: Diagnosis not present

## 2017-11-11 DIAGNOSIS — L821 Other seborrheic keratosis: Secondary | ICD-10-CM | POA: Diagnosis not present

## 2017-12-01 ENCOUNTER — Other Ambulatory Visit: Payer: Self-pay | Admitting: Internal Medicine

## 2018-02-19 ENCOUNTER — Encounter: Payer: Self-pay | Admitting: Internal Medicine

## 2018-02-19 DIAGNOSIS — M25552 Pain in left hip: Secondary | ICD-10-CM

## 2018-02-19 MED ORDER — BUDESONIDE-FORMOTEROL FUMARATE 160-4.5 MCG/ACT IN AERO: 2.0000 | INHALATION_SPRAY | Freq: Two times a day (BID) | RESPIRATORY_TRACT | 5 refills | Status: DC

## 2018-02-19 MED ORDER — ALBUTEROL SULFATE HFA 108 (90 BASE) MCG/ACT IN AERS: 2.0000 | INHALATION_SPRAY | Freq: Four times a day (QID) | RESPIRATORY_TRACT | 6 refills | Status: DC | PRN

## 2018-02-21 DIAGNOSIS — M5417 Radiculopathy, lumbosacral region: Secondary | ICD-10-CM | POA: Diagnosis not present

## 2018-02-21 DIAGNOSIS — M25552 Pain in left hip: Secondary | ICD-10-CM | POA: Diagnosis not present

## 2018-02-21 DIAGNOSIS — M62830 Muscle spasm of back: Secondary | ICD-10-CM | POA: Diagnosis not present

## 2018-02-21 DIAGNOSIS — M9903 Segmental and somatic dysfunction of lumbar region: Secondary | ICD-10-CM | POA: Diagnosis not present

## 2018-02-25 DIAGNOSIS — M25552 Pain in left hip: Secondary | ICD-10-CM | POA: Diagnosis not present

## 2018-02-25 DIAGNOSIS — M62830 Muscle spasm of back: Secondary | ICD-10-CM | POA: Diagnosis not present

## 2018-02-25 DIAGNOSIS — M9903 Segmental and somatic dysfunction of lumbar region: Secondary | ICD-10-CM | POA: Diagnosis not present

## 2018-02-25 DIAGNOSIS — M5417 Radiculopathy, lumbosacral region: Secondary | ICD-10-CM | POA: Diagnosis not present

## 2018-02-26 ENCOUNTER — Ambulatory Visit: Payer: BLUE CROSS/BLUE SHIELD | Admitting: Family Medicine

## 2018-02-26 DIAGNOSIS — M62830 Muscle spasm of back: Secondary | ICD-10-CM | POA: Diagnosis not present

## 2018-02-26 DIAGNOSIS — M5417 Radiculopathy, lumbosacral region: Secondary | ICD-10-CM | POA: Diagnosis not present

## 2018-02-26 DIAGNOSIS — M9903 Segmental and somatic dysfunction of lumbar region: Secondary | ICD-10-CM | POA: Diagnosis not present

## 2018-02-26 DIAGNOSIS — M25552 Pain in left hip: Secondary | ICD-10-CM | POA: Diagnosis not present

## 2018-03-06 DIAGNOSIS — M62830 Muscle spasm of back: Secondary | ICD-10-CM | POA: Diagnosis not present

## 2018-03-06 DIAGNOSIS — M5417 Radiculopathy, lumbosacral region: Secondary | ICD-10-CM | POA: Diagnosis not present

## 2018-03-06 DIAGNOSIS — M9903 Segmental and somatic dysfunction of lumbar region: Secondary | ICD-10-CM | POA: Diagnosis not present

## 2018-03-06 DIAGNOSIS — M25552 Pain in left hip: Secondary | ICD-10-CM | POA: Diagnosis not present

## 2018-05-28 DIAGNOSIS — Z08 Encounter for follow-up examination after completed treatment for malignant neoplasm: Secondary | ICD-10-CM | POA: Diagnosis not present

## 2018-05-28 DIAGNOSIS — Z85828 Personal history of other malignant neoplasm of skin: Secondary | ICD-10-CM | POA: Diagnosis not present

## 2018-05-28 DIAGNOSIS — L281 Prurigo nodularis: Secondary | ICD-10-CM | POA: Diagnosis not present

## 2018-05-28 DIAGNOSIS — L570 Actinic keratosis: Secondary | ICD-10-CM | POA: Diagnosis not present

## 2018-05-28 DIAGNOSIS — B354 Tinea corporis: Secondary | ICD-10-CM | POA: Diagnosis not present

## 2018-06-23 ENCOUNTER — Other Ambulatory Visit: Payer: Self-pay

## 2018-06-23 MED ORDER — LOSARTAN POTASSIUM-HCTZ 100-12.5 MG PO TABS: 1.0000 | ORAL_TABLET | Freq: Every day | ORAL | 2 refills | Status: DC

## 2018-07-14 ENCOUNTER — Encounter: Payer: Self-pay | Admitting: Internal Medicine

## 2018-07-14 ENCOUNTER — Ambulatory Visit (INDEPENDENT_AMBULATORY_CARE_PROVIDER_SITE_OTHER): Payer: BLUE CROSS/BLUE SHIELD | Admitting: Internal Medicine

## 2018-07-14 VITALS — BP 136/78 | HR 64 | Temp 97.8°F | Resp 16 | Ht 69.0 in | Wt 267.0 lb

## 2018-07-14 DIAGNOSIS — R430 Anosmia: Secondary | ICD-10-CM | POA: Diagnosis not present

## 2018-07-14 DIAGNOSIS — Z0000 Encounter for general adult medical examination without abnormal findings: Secondary | ICD-10-CM

## 2018-07-14 LAB — CBC WITH DIFFERENTIAL/PLATELET
BASOS PCT: 0.7 % (ref 0.0–3.0)
Basophils Absolute: 0 10*3/uL (ref 0.0–0.1)
EOS ABS: 0.1 10*3/uL (ref 0.0–0.7)
EOS PCT: 2.2 % (ref 0.0–5.0)
HCT: 43.7 % (ref 39.0–52.0)
HEMOGLOBIN: 15.5 g/dL (ref 13.0–17.0)
LYMPHS ABS: 2 10*3/uL (ref 0.7–4.0)
LYMPHS PCT: 32 % (ref 12.0–46.0)
MCHC: 35.4 g/dL (ref 30.0–36.0)
MCV: 89.6 fl (ref 78.0–100.0)
MONO ABS: 0.6 10*3/uL (ref 0.1–1.0)
Monocytes Relative: 9.6 % (ref 3.0–12.0)
Neutro Abs: 3.5 10*3/uL (ref 1.4–7.7)
Neutrophils Relative %: 55.5 % (ref 43.0–77.0)
PLATELETS: 310 10*3/uL (ref 150.0–400.0)
RBC: 4.88 Mil/uL (ref 4.22–5.81)
RDW: 12.6 % (ref 11.5–15.5)
WBC: 6.3 10*3/uL (ref 4.0–10.5)

## 2018-07-14 LAB — COMPREHENSIVE METABOLIC PANEL
ALBUMIN: 4.1 g/dL (ref 3.5–5.2)
ALK PHOS: 56 U/L (ref 39–117)
ALT: 20 U/L (ref 0–53)
AST: 15 U/L (ref 0–37)
BUN: 10 mg/dL (ref 6–23)
CO2: 32 mEq/L (ref 19–32)
CREATININE: 0.9 mg/dL (ref 0.40–1.50)
Calcium: 9.2 mg/dL (ref 8.4–10.5)
Chloride: 101 mEq/L (ref 96–112)
GFR: 92.55 mL/min (ref >60.00)
GLUCOSE: 104 mg/dL — AB (ref 70–99)
Potassium: 4 mEq/L (ref 3.5–5.1)
SODIUM: 138 meq/L (ref 135–145)
Total Bilirubin: 0.8 mg/dL (ref 0.2–1.2)
Total Protein: 6.6 g/dL (ref 6.0–8.3)

## 2018-07-14 LAB — LIPID PANEL
CHOLESTEROL: 182 mg/dL (ref 0–200)
HDL: 41.4 mg/dL (ref >39.00)
LDL Cholesterol: 106 mg/dL — ABNORMAL HIGH (ref 0–99)
NONHDL: 140.25
Total CHOL/HDL Ratio: 4
Triglycerides: 169 mg/dL — ABNORMAL HIGH (ref 0.0–149.0)
VLDL: 33.8 mg/dL (ref 0.0–40.0)

## 2018-07-14 LAB — PSA: PSA: 0.33 ng/mL (ref 0.10–4.00)

## 2018-07-14 LAB — HEMOGLOBIN A1C: HEMOGLOBIN A1C: 5.8 % (ref 4.6–6.5)

## 2018-07-14 NOTE — Assessment & Plan Note (Addendum)
-  Td 2014;  rec flu shot this year -CCS: per [facility]  cscope 03-10-2013  (normal per pt) -Prostate cancer screening:   + FH of prostate cancer,normal DRE today, check a PSA   -Labs:  Cmp, flp, cbc, a1c, tsh, psa -Diet discussed. Extensively discussed, info ref wt [facility] provided

## 2018-07-14 NOTE — Progress Notes (Signed)
Pre visit review using our clinic review tool, if applicable. No additional management support is needed unless otherwise documented below in the visit note. 

## 2018-07-14 NOTE — Progress Notes (Signed)
Subjective:    Patient ID: Christian Ramos, male    DOB: 10/20/1961, 57 y.o.   MRN: [id number]  DOS:  07/14/2018 Type of visit - description : cpx Interval history: His main concern is his BMI. Continue with anosmia, further eval?. Denies any LUTS.  Review of Systems  Other than above, a 14 point review of systems is negative      Past Medical History:  Diagnosis Date  . Hypertension   . Obesity (BMI 30-39.9)     Past Surgical History:  Procedure Laterality Date  . NECK SURGERY  ~ 2000   mass removed anteriorly, Dr Cloria SpringWoliki  . VASECTOMY  ~2000    Social History   Socioeconomic History  . Marital status: Married    Spouse name: Not on file  . Number of children: 2  . Years of education: Not on file  . Highest education level: Not on file  Occupational History  . 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 Financial resource strain: Not on file  . Food insecurity:    Worry: Not on file    Inability: Not on file  . Transportation needs:    Medical: Not on file    Non-medical: Not on file  Tobacco Use  . Smoking status: Never Smoker  . Smokeless tobacco: Never Used  Substance and Sexual Activity  . Alcohol use: Yes    Alcohol/week: 0.0 standard drinks    Comment: Social  . Drug use: No  . Sexual activity: Not on file  Lifestyle  . Physical activity:    Days per week: Not on file    Minutes per session: Not on file  . Stress: Not on file  Relationships  . Social connections:    Talks on phone: Not on file    Gets together: Not on file    Attends religious service: Not on file    Active member of club or organization: Not on file    Attends meetings of clubs or organizations: Not on file    Relationship status: Not on file  . Intimate partner violence:    Fear of current or ex partner: Not on file    Emotionally abused: Not on file    Physically abused: Not on file    Forced sexual activity: Not on file  Other Topics Concern  .  Not on file  Social History Narrative   2 daughters, one married lives in [place], the other is a Holiday representativesenior in college     Family History  Problem Relation Age of Onset  . Breast cancer Mother        lumpectomy, XRT, chemo  . Diabetes Mother   . Hypertension Mother   . Prostate cancer Father        ~ 56 y/o  . Heart disease Father        MI   . Colon cancer Neg Hx      Allergies as of 07/14/2018   No Known Allergies     Medication List        Accurate as of 07/14/18 11:59 PM. Always use your most recent med list.          albuterol 108 (90 Base) MCG/ACT inhaler Commonly known as:  PROVENTIL HFA;VENTOLIN HFA Inhale 2 puffs into the lungs every 6 (six) hours as needed for wheezing or shortness of breath.   budesonide-formoterol 160-4.5 MCG/ACT inhaler Commonly known as:  SYMBICORT Inhale 2 puffs into the  lungs 2 (two) times daily.   loratadine 10 MG tablet Commonly known as:  CLARITIN Take 10 mg by mouth daily.   losartan-hydrochlorothiazide 100-12.5 MG tablet Commonly known as:  HYZAAR Take 1 tablet by mouth daily.          Objective:   Physical Exam BP 136/78 (BP Location: Left Arm, Patient Position: Sitting, Cuff Size: Normal)   Pulse 64   Temp 97.8 F (36.6 C) (Oral)   Resp 16   Ht 5\' 9"  (1.753 m)   Wt 267 lb (121.1 kg)   SpO2 97%   BMI 39.43 kg/m  General: Well developed, NAD, see BMI.  Neck: No  thyromegaly  HEENT:  Normocephalic . Face symmetric, atraumatic Lungs:  CTA B Normal respiratory effort, no intercostal retractions, no accessory muscle use. Heart: RRR,  no murmur.  No pretibial edema bilaterally  Abdomen:  Not distended, soft, non-tender. No rebound or rigidity.   Skin: Exposed areas without rash. Not pale. Not jaundice Rectal: External abnormalities: none. Normal sphincter tone. No rectal masses or tenderness.  No  stools Prostate: Prostate gland firm and smooth, no enlargement, nodularity, tenderness, mass, asymmetry or  induration Neurologic:  alert & oriented X3.  Speech normal, gait appropriate for age and unassisted Strength symmetric and appropriate for age.  Psych: Cognition and judgment appear intact.  Cooperative with normal attention span and concentration.  Behavior appropriate. No anxious or depressed appearing.      Assessment & Plan:   Assessment  (transfer from [facility] 04/17/2016) HTN  Obesity Exercise  induced asthma OSA? HA-anosmia: saw neuro, last OV 11-2016; MRI (-), likely cervicogenic HA, pt declined Gabapentin, RX muscle relaxants, PT  Plan: HTN: On Hyzaar, BPs at home very good.  No change, labs. Obesity: Extensive conversation about diet, exercise.  Recommend to see  one of the weight management clinics in town Anosmia: Ongoing issue, patient really likes further eval.  Refer to ENT at [facility]. Also reviewed his meds, none are listed to cause anosmia  RTC 1 year

## 2018-07-14 NOTE — Patient Instructions (Signed)
GO TO THE LAB : Get the blood work     GO TO THE FRONT DESK Schedule your next appointment for a  Physical in 1 year  

## 2018-07-15 NOTE — Assessment & Plan Note (Signed)
HTN: On Hyzaar, BPs at home very good.  No change, labs. Obesity: Extensive conversation about diet, exercise.  Recommend to see  one of the weight management clinics in town Anosmia: Ongoing issue, patient really likes further eval.  [facility]. Also reviewed his meds, none are listed to cause anosmia  RTC 1 year

## 2018-07-22 ENCOUNTER — Encounter: Payer: Self-pay | Admitting: Internal Medicine

## 2018-08-21 DIAGNOSIS — R430 Anosmia: Secondary | ICD-10-CM | POA: Insufficient documentation

## 2018-08-21 DIAGNOSIS — R0683 Snoring: Secondary | ICD-10-CM | POA: Diagnosis not present

## 2018-08-21 DIAGNOSIS — R0981 Nasal congestion: Secondary | ICD-10-CM | POA: Diagnosis not present

## 2018-09-12 ENCOUNTER — Telehealth: Payer: Self-pay | Admitting: *Deleted

## 2018-09-12 NOTE — Telephone Encounter (Signed)
Received Physician Results Form/Biometric Screening from [facility]; forwarded to provider/SLS 11/15

## 2018-09-15 NOTE — Telephone Encounter (Signed)
Form completed and faxed to [facility] at [phone]. Form sent for scanning.

## 2018-09-28 DIAGNOSIS — H4310 Vitreous hemorrhage, unspecified eye: Secondary | ICD-10-CM

## 2018-09-28 HISTORY — DX: Vitreous hemorrhage, unspecified eye: H43.10

## 2018-10-13 DIAGNOSIS — H33311 Horseshoe tear of retina without detachment, right eye: Secondary | ICD-10-CM | POA: Diagnosis not present

## 2018-10-13 DIAGNOSIS — H33331 Multiple defects of retina without detachment, right eye: Secondary | ICD-10-CM | POA: Diagnosis not present

## 2018-10-13 DIAGNOSIS — H43811 Vitreous degeneration, right eye: Secondary | ICD-10-CM | POA: Diagnosis not present

## 2018-10-13 DIAGNOSIS — H35413 Lattice degeneration of retina, bilateral: Secondary | ICD-10-CM | POA: Diagnosis not present

## 2018-10-13 DIAGNOSIS — H43812 Vitreous degeneration, left eye: Secondary | ICD-10-CM | POA: Diagnosis not present

## 2018-10-13 DIAGNOSIS — H33322 Round hole, left eye: Secondary | ICD-10-CM | POA: Diagnosis not present

## 2018-10-13 DIAGNOSIS — H35412 Lattice degeneration of retina, left eye: Secondary | ICD-10-CM | POA: Diagnosis not present

## 2018-10-13 DIAGNOSIS — H35411 Lattice degeneration of retina, right eye: Secondary | ICD-10-CM | POA: Diagnosis not present

## 2018-10-13 DIAGNOSIS — H4311 Vitreous hemorrhage, right eye: Secondary | ICD-10-CM | POA: Diagnosis not present

## 2018-10-24 DIAGNOSIS — H4311 Vitreous hemorrhage, right eye: Secondary | ICD-10-CM | POA: Diagnosis not present

## 2018-10-25 DIAGNOSIS — H43811 Vitreous degeneration, right eye: Secondary | ICD-10-CM | POA: Diagnosis not present

## 2018-10-25 DIAGNOSIS — H33311 Horseshoe tear of retina without detachment, right eye: Secondary | ICD-10-CM | POA: Diagnosis not present

## 2018-10-25 DIAGNOSIS — H4311 Vitreous hemorrhage, right eye: Secondary | ICD-10-CM | POA: Diagnosis not present

## 2018-10-27 DIAGNOSIS — H33311 Horseshoe tear of retina without detachment, right eye: Secondary | ICD-10-CM | POA: Diagnosis not present

## 2018-10-27 DIAGNOSIS — H43811 Vitreous degeneration, right eye: Secondary | ICD-10-CM | POA: Diagnosis not present

## 2018-10-27 DIAGNOSIS — H4311 Vitreous hemorrhage, right eye: Secondary | ICD-10-CM | POA: Diagnosis not present

## 2018-11-03 ENCOUNTER — Other Ambulatory Visit: Payer: Self-pay | Admitting: Internal Medicine

## 2018-11-26 DIAGNOSIS — Z85828 Personal history of other malignant neoplasm of skin: Secondary | ICD-10-CM | POA: Diagnosis not present

## 2018-11-26 DIAGNOSIS — L570 Actinic keratosis: Secondary | ICD-10-CM | POA: Diagnosis not present

## 2018-11-26 DIAGNOSIS — Z08 Encounter for follow-up examination after completed treatment for malignant neoplasm: Secondary | ICD-10-CM | POA: Diagnosis not present

## 2018-11-26 DIAGNOSIS — L821 Other seborrheic keratosis: Secondary | ICD-10-CM | POA: Diagnosis not present

## 2018-12-30 ENCOUNTER — Encounter: Payer: Self-pay | Admitting: Internal Medicine

## 2018-12-30 MED ORDER — LOSARTAN POTASSIUM-HCTZ 100-12.5 MG PO TABS: 1.0000 | ORAL_TABLET | Freq: Every day | ORAL | 2 refills | Status: DC

## 2019-07-16 ENCOUNTER — Ambulatory Visit (INDEPENDENT_AMBULATORY_CARE_PROVIDER_SITE_OTHER): Payer: BC Managed Care – PPO | Admitting: Internal Medicine

## 2019-07-16 ENCOUNTER — Other Ambulatory Visit: Payer: Self-pay | Admitting: Internal Medicine

## 2019-07-16 ENCOUNTER — Encounter: Payer: Self-pay | Admitting: Internal Medicine

## 2019-07-16 ENCOUNTER — Other Ambulatory Visit: Payer: Self-pay

## 2019-07-16 VITALS — BP 155/110 | HR 70 | Temp 97.8°F | Resp 16 | Ht 69.0 in | Wt 247.2 lb

## 2019-07-16 DIAGNOSIS — Z0000 Encounter for general adult medical examination without abnormal findings: Secondary | ICD-10-CM | POA: Diagnosis not present

## 2019-07-16 DIAGNOSIS — R739 Hyperglycemia, unspecified: Secondary | ICD-10-CM | POA: Diagnosis not present

## 2019-07-16 DIAGNOSIS — J45990 Exercise induced bronchospasm: Secondary | ICD-10-CM | POA: Diagnosis not present

## 2019-07-16 DIAGNOSIS — E669 Obesity, unspecified: Secondary | ICD-10-CM

## 2019-07-16 LAB — CBC WITH DIFFERENTIAL/PLATELET
Basophils Absolute: 0.1 10*3/uL (ref 0.0–0.1)
Basophils Relative: 1 % (ref 0.0–3.0)
Eosinophils Absolute: 0.2 10*3/uL (ref 0.0–0.7)
Eosinophils Relative: 2.9 % (ref 0.0–5.0)
HCT: 46.4 % (ref 39.0–52.0)
Hemoglobin: 16.1 g/dL (ref 13.0–17.0)
Lymphocytes Relative: 31.9 % (ref 12.0–46.0)
Lymphs Abs: 1.9 10*3/uL (ref 0.7–4.0)
MCHC: 34.6 g/dL (ref 30.0–36.0)
MCV: 91.9 fl (ref 78.0–100.0)
Monocytes Absolute: 0.5 10*3/uL (ref 0.1–1.0)
Monocytes Relative: 9.1 % (ref 3.0–12.0)
Neutro Abs: 3.3 10*3/uL (ref 1.4–7.7)
Neutrophils Relative %: 55.1 % (ref 43.0–77.0)
Platelets: 307 10*3/uL (ref 150.0–400.0)
RBC: 5.05 Mil/uL (ref 4.22–5.81)
RDW: 12.9 % (ref 11.5–15.5)
WBC: 6 10*3/uL (ref 4.0–10.5)

## 2019-07-16 LAB — COMPREHENSIVE METABOLIC PANEL
ALT: 18 U/L (ref 0–53)
AST: 16 U/L (ref 0–37)
Albumin: 4.2 g/dL (ref 3.5–5.2)
Alkaline Phosphatase: 59 U/L (ref 39–117)
BUN: 19 mg/dL (ref 6–23)
CO2: 28 mEq/L (ref 19–32)
Calcium: 9.5 mg/dL (ref 8.4–10.5)
Chloride: 100 mEq/L (ref 96–112)
Creatinine, Ser: 0.96 mg/dL (ref 0.40–1.50)
GFR: 80.54 mL/min (ref >60.00)
Glucose, Bld: 107 mg/dL — ABNORMAL HIGH (ref 70–99)
Potassium: 4 mEq/L (ref 3.5–5.1)
Sodium: 137 mEq/L (ref 135–145)
Total Bilirubin: 0.9 mg/dL (ref 0.2–1.2)
Total Protein: 6.9 g/dL (ref 6.0–8.3)

## 2019-07-16 LAB — LIPID PANEL
Cholesterol: 213 mg/dL — ABNORMAL HIGH (ref 0–200)
HDL: 46 mg/dL (ref >39.00)
LDL Cholesterol: 130 mg/dL — ABNORMAL HIGH (ref 0–99)
NonHDL: 167.47
Total CHOL/HDL Ratio: 5
Triglycerides: 188 mg/dL — ABNORMAL HIGH (ref 0.0–149.0)
VLDL: 37.6 mg/dL (ref 0.0–40.0)

## 2019-07-16 LAB — PSA: PSA: 0.46 ng/mL (ref 0.10–4.00)

## 2019-07-16 LAB — TSH: TSH: 1 u[IU]/mL (ref 0.35–4.50)

## 2019-07-16 LAB — HEMOGLOBIN A1C: Hgb A1c MFr Bld: 5.4 % (ref 4.6–6.5)

## 2019-07-16 MED ORDER — ALBUTEROL SULFATE HFA 108 (90 BASE) MCG/ACT IN AERS: 2.0000 | INHALATION_SPRAY | Freq: Four times a day (QID) | RESPIRATORY_TRACT | 6 refills | Status: DC | PRN

## 2019-07-16 MED ORDER — LOSARTAN POTASSIUM-HCTZ 100-12.5 MG PO TABS: 1.0000 | ORAL_TABLET | Freq: Every day | ORAL | 3 refills | Status: DC

## 2019-07-16 NOTE — Assessment & Plan Note (Signed)
-  Td 2014;  flu shot recommended  -[facility]: per [facility]  cscope 03-10-2013: normal per pt, @ [facility]? Will try to get records  -Prostate cancer screening:   + FH of prostate cancer,normal DRE -PSA  wnl 2019.  Check PSA -Labs: CMP, FLP, CBC, A1c, TSH, PSA    -Diet discussed.  Doing great.  Losing weight.  Exercise daily.

## 2019-07-16 NOTE — Progress Notes (Signed)
Pre visit review using our clinic review tool, if applicable. No additional management support is needed unless otherwise documented below in the visit note. 

## 2019-07-16 NOTE — Patient Instructions (Signed)
GO TO THE LAB : Get the blood work     GO TO THE FRONT DESK Schedule your next appointment for a check up in 6 months       Check the  blood pressure 2 or 3 times a   week   BP GOAL is between 110/65 and  135/85. If it is consistently higher or lower, let me know

## 2019-07-16 NOTE — Progress Notes (Signed)
Subjective:    Patient ID: Christian Ramos, male    DOB: Nov 06, 1960, 58 y.o.   MRN: [id number]  DOS:  07/16/2019 Type of visit - description: CPX Feeling great Has lost weight with healthier diet and exercise over the  last year. Asthma: Has sporadic symptoms, sometimes cough, shortness of breath and wheezing after running, symptoms quickly respond to albuterol.  Wt Readings from Last 3 Encounters:  07/16/19 247 lb 4 oz (112.2 kg)  07/14/18 267 lb (121.1 kg)  09/26/17 267 lb 2 oz (121.2 kg)     Review of Systems  Other than above, a 14 point review of systems is negative    Past Medical History:  Diagnosis Date  . Anosmia   . Hypertension   . Obesity (BMI 30-39.9)   . Vitreous hemorrhage (HCC) 09/2018   R    Past Surgical History:  Procedure Laterality Date  . NECK SURGERY  ~ 2000   mass removed anteriorly, Dr Cloria SpringWoliki  . VASECTOMY  ~2000    Social History   Socioeconomic History  . Marital status: Married    Spouse name: Not on file  . Number of children: 2  . Years of education: Not on file  . Highest education level: Not on file  Occupational History  . 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 Financial resource strain: Not on file  . Food insecurity    Worry: Not on file    Inability: Not on file  . Transportation needs    Medical: Not on file    Non-medical: Not on file  Tobacco Use  . Smoking status: Never Smoker  . Smokeless tobacco: Never Used  Substance and Sexual Activity  . Alcohol use: Yes    Alcohol/week: 0.0 standard drinks    Comment: Social  . Drug use: No  . Sexual activity: Not on file  Lifestyle  . Physical activity    Days per week: Not on file    Minutes per session: Not on file  . Stress: Not on file  Relationships  . Social Musicianconnections    Talks on phone: Not on file    Gets together: Not on file    Attends religious service: Not on file    Active member of club or organization: Not on file   Attends meetings of clubs or organizations: Not on file    Relationship status: Not on file  . Intimate partner violence    Fear of current or ex partner: Not on file    Emotionally abused: Not on file    Physically abused: Not on file    Forced sexual activity: Not on file  Other Topics Concern  . Not on file  Social History Narrative   2 daughters, one married lives in [place], the other is a Holiday representativesenior in college     Family History  Problem Relation Age of Onset  . Breast cancer Mother        lumpectomy, XRT, chemo  . Diabetes Mother   . Hypertension Mother   . Prostate cancer Father [id number]       ~ 57 y/o  . Heart disease Father        MI   . Colon cancer Neg Hx      Allergies as of 07/16/2019   No Known Allergies     Medication List       Accurate as of July 16, 2019 11:59 PM. If you have any questions,  ask your nurse or doctor.        STOP taking these medications   budesonide-formoterol 160-4.5 MCG/ACT inhaler Commonly known as: SYMBICORT Stopped by: Kathlene November, MD     TAKE these medications   albuterol 108 (90 Base) MCG/ACT inhaler Commonly known as: Ventolin HFA Inhale 2 puffs into the lungs every 6 (six) hours as needed for wheezing or shortness of breath.   loratadine 10 MG tablet Commonly known as: CLARITIN Take 10 mg by mouth daily.   losartan-hydrochlorothiazide 100-12.5 MG tablet Commonly known as: HYZAAR Take 1 tablet by mouth daily.           Objective:   Physical Exam BP (!) 155/110 (BP Location: Left Arm)   Pulse 70   Temp 97.8 F (36.6 C) (Temporal)   Resp 16   Ht 5\' 9"  (1.753 m)   Wt 247 lb 4 oz (112.2 kg)   SpO2 98%   BMI 36.51 kg/m  General: Well developed, NAD, BMI noted Neck: No  thyromegaly  HEENT:  Normocephalic . Face symmetric, atraumatic Lungs:  CTA B Normal respiratory effort, no intercostal retractions, no accessory muscle use. Heart: RRR,  no murmur.  Trace pretibial edema bilaterally  Abdomen:  Not  distended, soft, non-tender. No rebound or rigidity.   Skin: Exposed areas without rash. Not pale. Not jaundice Neurologic:  alert & oriented X3.  Speech normal, gait appropriate for age and unassisted Strength symmetric and appropriate for age.  Psych: Cognition and judgment appear intact.  Cooperative with normal attention span and concentration.  Behavior appropriate. No anxious or depressed appearing.     Assessment     Assessment  (transfer from [facility] 04/17/2016) HTN  Obesity Exercise  induced asthma OSA? HA-anosmia: saw neuro, last OV 11-2016; MRI (-), likely cervicogenic HA, pt declined Gabapentin, RX muscle relaxants, PT R Vitreous hemorrhage 09/2018  PLAN HTN: On Hyzaar 100-12 0.5 daily.  Ambulatory BPs well within normal at rest.  BP today is elevated, he finished running 20 minutes before arriving to the office.  I rechecked his BP and it was 155/110.  We agreed to continue same meds, monitor BPs at home and come back in 6 months Obesity: Doing great, lost 20 pounds since the last visit Right vitreous hemorrhage 09/2018: Recuperating well, vision is essentially back to baseline. RTC 6 months

## 2019-07-17 NOTE — Assessment & Plan Note (Signed)
HTN: On Hyzaar 100-12 0.5 daily.  Ambulatory BPs well within normal at rest.  BP today is elevated, he finished running 20 minutes before arriving to the office.  I rechecked his BP and it was 155/110.  We agreed to continue same meds, monitor BPs at home and come back in 6 months Obesity: Doing great, lost 20 pounds since the last visit Right vitreous hemorrhage 09/2018: Recuperating well, vision is essentially back to baseline. RTC 6 months

## 2019-08-03 ENCOUNTER — Telehealth: Payer: Self-pay | Admitting: Internal Medicine

## 2019-08-03 DIAGNOSIS — Z1211 Encounter for screening for malignant neoplasm of colon: Secondary | ICD-10-CM

## 2019-08-03 NOTE — Telephone Encounter (Signed)
Mychart message sent to Pt. GI referral placed.  

## 2019-08-03 NOTE — Telephone Encounter (Signed)
Please tell the patient we got the colonoscopy report from 03/10/2013. He had 4 tubular adenomas  He is due for a colonoscopy, refer back to the same GI practice.

## 2019-09-07 DIAGNOSIS — L570 Actinic keratosis: Secondary | ICD-10-CM | POA: Diagnosis not present

## 2019-09-07 DIAGNOSIS — Z85828 Personal history of other malignant neoplasm of skin: Secondary | ICD-10-CM | POA: Diagnosis not present

## 2019-09-07 DIAGNOSIS — L821 Other seborrheic keratosis: Secondary | ICD-10-CM | POA: Diagnosis not present

## 2019-09-07 DIAGNOSIS — B354 Tinea corporis: Secondary | ICD-10-CM | POA: Diagnosis not present

## 2019-09-28 ENCOUNTER — Telehealth: Payer: Self-pay | Admitting: Internal Medicine

## 2019-09-28 MED ORDER — LOSARTAN POTASSIUM-HCTZ 100-12.5 MG PO TABS: 1.0000 | ORAL_TABLET | Freq: Every day | ORAL | 3 refills | Status: DC

## 2019-09-28 NOTE — Telephone Encounter (Signed)
Patient requesting losartan-hydrochlorothiazide (HYZAAR) 100-12.5 MG tablet, informed patient please allow 48 to 72 hour turn around time   [place]

## 2019-09-28 NOTE — Telephone Encounter (Signed)
Refills were sent on 07/16/2019 however will send again.

## 2019-10-06 ENCOUNTER — Other Ambulatory Visit: Payer: Self-pay | Admitting: Internal Medicine

## 2019-11-01 ENCOUNTER — Encounter: Payer: Self-pay | Admitting: Internal Medicine

## 2019-11-03 ENCOUNTER — Other Ambulatory Visit: Payer: Self-pay | Admitting: Internal Medicine

## 2019-11-03 MED ORDER — AMLODIPINE BESYLATE 5 MG PO TABS: 5.0000 mg | ORAL_TABLET | Freq: Every day | ORAL | 1 refills | Status: DC

## 2019-11-04 NOTE — Telephone Encounter (Signed)
Left message on voice mail for pt to call office back to schedule a follow up appointment with Dr. Drue Novel for 11/17/19.

## 2019-11-04 NOTE — Telephone Encounter (Signed)
-----   Message from Wanda Plump, MD sent at 11/03/2019 10:08 AM EST ----- Regarding: Needs a in person visit in approximately 2 weeks

## 2019-11-11 ENCOUNTER — Inpatient Hospital Stay
Admission: RE | Admit: 2019-11-11 | Discharge: 2019-11-11 | Disposition: A | Discharge status: Home / Self Care | Payer: BC Managed Care – PPO | Source: Ambulatory Visit

## 2019-11-11 ENCOUNTER — Ambulatory Visit (INDEPENDENT_AMBULATORY_CARE_PROVIDER_SITE_OTHER): Payer: BC Managed Care – PPO | Admitting: Internal Medicine

## 2019-11-11 ENCOUNTER — Encounter: Payer: Self-pay | Admitting: Internal Medicine

## 2019-11-11 ENCOUNTER — Other Ambulatory Visit: Payer: Self-pay

## 2019-11-11 DIAGNOSIS — R0982 Postnasal drip: Secondary | ICD-10-CM | POA: Diagnosis not present

## 2019-11-11 DIAGNOSIS — R0981 Nasal congestion: Secondary | ICD-10-CM | POA: Diagnosis not present

## 2019-11-11 DIAGNOSIS — Z20828 Contact with and (suspected) exposure to other viral communicable diseases: Secondary | ICD-10-CM | POA: Diagnosis not present

## 2019-11-11 DIAGNOSIS — J069 Acute upper respiratory infection, unspecified: Secondary | ICD-10-CM

## 2019-11-11 DIAGNOSIS — I10 Essential (primary) hypertension: Secondary | ICD-10-CM

## 2019-11-11 MED ORDER — ALBUTEROL SULFATE HFA 108 (90 BASE) MCG/ACT IN AERS: 2.0000 | INHALATION_SPRAY | Freq: Four times a day (QID) | RESPIRATORY_TRACT | 3 refills | Status: DC | PRN

## 2019-11-11 MED ORDER — AZELASTINE HCL 0.1 % NA SOLN: 2.0000 | Freq: Two times a day (BID) | NASAL | 1 refills | Status: DC

## 2019-11-11 NOTE — Telephone Encounter (Signed)
See additional mychart message from today. Virtual visit has been scheduled for today at 2:20pm.

## 2019-11-11 NOTE — Progress Notes (Signed)
Subjective:    Patient ID: Christian Ramos, male    DOB: [birth year], 59 y.o.   MRN: [id number]  DOS:  11/11/2019 Type of visit - description: Virtual Visit via Video Note  I connected with the above patient  by a video enabled telemedicine application and verified that I am speaking with the correct person using two identifiers.   THIS ENCOUNTER IS A VIRTUAL VISIT DUE TO COVID-19 - PATIENT WAS NOT SEEN IN THE OFFICE. PATIENT HAS CONSENTED TO VIRTUAL VISIT / TELEMEDICINE VISIT   Location of patient: home  Location of provider: office  I discussed the limitations of evaluation and management by telemedicine and the availability of in person appointments. The patient expressed understanding and agreed to proceed.  Acute Symptoms started 2 days ago: Sudden onset of sneezing, runny nose, cough with clear sputum, eyes watery, postnasal dripping. He started to use Claritin and Sudafed. Today he feels about the same.  Review of Systems Denies feeling poorly or malaise. No fever chills No myalgias or lack of energy No nausea, vomiting, diarrhea  Past Medical History:  Diagnosis Date  . Anosmia   . Hypertension   . Obesity (BMI 30-39.9)   . Vitreous hemorrhage (HCC) 09/2018   R    Past Surgical History:  Procedure Laterality Date  . NECK SURGERY  ~ 2000   mass removed anteriorly, Dr Cloria Spring  . VASECTOMY  ~2000    Social History   Socioeconomic History  . Marital status: Married    Spouse name: Not on file  . Number of children: 2  . Years of education: Not on file  . Highest education level: Not on file  Occupational History  . 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 Smoking status: Never Smoker  . Smokeless tobacco: Never Used  Substance and Sexual Activity  . Alcohol use: Yes    Alcohol/week: 0.0 standard drinks    Comment: Social  . Drug use: No  . Sexual activity: Not on file  Other Topics Concern  . Not on file  Social History  Narrative   2 daughters, one married lives in [place], the other is a Holiday representative in college   Social Determinants of Health   Financial Resource Strain:   . Difficulty of Paying Living Expenses: Not on file  Food Insecurity:   . Worried About Programme researcher, broadcasting/film/video in the Last Year: Not on file  . Ran Out of Food in the Last Year: Not on file  Transportation Needs:   . Lack of Transportation (Medical): Not on file  . Lack of Transportation (Non-Medical): Not on file  Physical Activity:   . Days of Exercise per Week: Not on file  . Minutes of Exercise per Session: Not on file  Stress:   . Feeling of Stress : Not on file  Social Connections:   . Frequency of Communication with Friends and Family: Not on file  . Frequency of Social Gatherings with Friends and Family: Not on file  . Attends Religious Services: Not on file  . Active Member of Clubs or Organizations: Not on file  . Attends Banker Meetings: Not on file  . Marital Status: Not on file  Intimate Partner Violence:   . Fear of Current or Ex-Partner: Not on file  . Emotionally Abused: Not on file  . Physically Abused: Not on file  . Sexually Abused: Not on file      Allergies as of 11/11/2019  No Known Allergies     Medication List       Accurate as of November 11, 2019  2:41 PM. If you have any questions, ask your nurse or doctor.        albuterol 108 (90 Base) MCG/ACT inhaler Commonly known as: Ventolin HFA Inhale 2 puffs into the lungs every 6 (six) hours as needed for wheezing or shortness of breath.   amLODipine 5 MG tablet Commonly known as: NORVASC Take 1 tablet (5 mg total) by mouth daily.   loratadine 10 MG tablet Commonly known as: CLARITIN Take 10 mg by mouth daily.   losartan-hydrochlorothiazide 100-12.5 MG tablet Commonly known as: HYZAAR Take 1 tablet by mouth daily.           Objective:   Physical Exam There were no vitals taken for this visit. This is a virtual video visit,  alert oriented x3, in no apparent distress.  He does sound somewhat congestion at the nose.  No cough noted.    Assessment     Assessment  (transfer from [facility] 04/17/2016) HTN  Obesity Exercise  induced asthma OSA? HA-anosmia: saw neuro, last OV 11-2016; MRI (-), likely cervicogenic HA, pt declined Gabapentin, RX muscle relaxants, PT R Vitreous hemorrhage 09/2018  PLAN URI: Sudden onset of upper respiratory symptoms, DDx includes URI, Covid, allergies. Plan: Information about Covid testing at [facility] health provided.  he could also do it at a local pharmacy. Rest, fluids, Tylenol, Claritin.  Avoid decongestants due to history of HTN, prescription for Astelin sent. Call if not improving and also let me know  Covid testing results, until then I recommend quarantine. HTN: Recently call with elevated BP, amlodipine was added however his blood pressure is now back to normal taking only Hyzaar.  Systolic BPs: 803, 212.  Plan: Stay on Hyzaar only for now, remove amlodipine from his medication list, continue monitoring BPs. Exercise-induced asthma: Refill albuterol    I discussed the assessment and treatment plan with the patient. The patient was provided an opportunity to ask questions and all were answered. The patient agreed with the plan and demonstrated an understanding of the instructions.   The patient was advised to call back or seek an in-person evaluation if the symptoms worsen or if the condition fails to improve as anticipated.

## 2019-11-11 NOTE — Telephone Encounter (Signed)
Pt completed Virtual visit today.

## 2019-11-14 ENCOUNTER — Encounter: Payer: Self-pay | Admitting: Internal Medicine

## 2019-11-14 NOTE — Assessment & Plan Note (Signed)
URI: Sudden onset of upper respiratory symptoms, DDx includes URI, Covid, allergies. Plan: Information about Covid testing at [facility] health provided.  he could also do it at a local pharmacy. Rest, fluids, Tylenol, Claritin.  Avoid decongestants due to history of HTN, prescription for Astelin sent. Call if not improving and also let me know  Covid testing results, until then I recommend quarantine. HTN: Recently call with elevated BP, amlodipine was added however his blood pressure is now back to normal taking only Hyzaar.  Systolic BPs: 120, 119.  Plan: Stay on Hyzaar only for now, remove amlodipine from his medication list, continue monitoring BPs. Exercise-induced asthma: Refill albuterol

## 2019-11-16 ENCOUNTER — Other Ambulatory Visit: Payer: Self-pay

## 2019-11-17 ENCOUNTER — Encounter: Payer: Self-pay | Admitting: Internal Medicine

## 2019-11-17 ENCOUNTER — Ambulatory Visit (INDEPENDENT_AMBULATORY_CARE_PROVIDER_SITE_OTHER): Payer: BC Managed Care – PPO | Admitting: Internal Medicine

## 2019-11-17 ENCOUNTER — Other Ambulatory Visit: Payer: Self-pay

## 2019-11-17 VITALS — BP 138/99 | HR 74 | Temp 97.1°F | Resp 16 | Ht 69.0 in | Wt 253.1 lb

## 2019-11-17 DIAGNOSIS — I10 Essential (primary) hypertension: Secondary | ICD-10-CM

## 2019-11-17 DIAGNOSIS — J40 Bronchitis, not specified as acute or chronic: Secondary | ICD-10-CM | POA: Diagnosis not present

## 2019-11-17 MED ORDER — HYDROCHLOROTHIAZIDE 12.5 MG PO TABS: 12.5000 mg | ORAL_TABLET | Freq: Every day | ORAL | 2 refills | Status: DC

## 2019-11-17 MED ORDER — AZITHROMYCIN 250 MG PO TABS: ORAL_TABLET | ORAL | 0 refills | Status: DC

## 2019-11-17 NOTE — Patient Instructions (Addendum)
Continue Hyzaar 100-12.5 mg 1 tablet a day  Add hydrochlorothiazide 12.5 mg 1 tablet daily along with your Hyzaar   Continue monitoring your blood pressure: Blood pressure goal between 120/65 and 135/85  Start the antibiotic called Zithromax  Continue cough suppressants and nose spray  Call me if not completely back to normal in 2 to 3 weeks  Schedule a lab appointment 2 weeks from today  Schedule a checkup in 6 months  If your blood pressure is better and your labs are okay will then prescribe Hyzaar 100-25 mg 1 tablet daily.

## 2019-11-17 NOTE — Progress Notes (Signed)
Pre visit review using our clinic review tool, if applicable. No additional management support is needed unless otherwise documented below in the visit note. 

## 2019-11-17 NOTE — Progress Notes (Signed)
   Subjective:    Patient ID: Christian Ramos, male    DOB: 06-Dec-1960, 59 y.o.   MRN: [id number]  DOS:  11/17/2019 Type of visit - description: Follow-up See last visit, had a URI, testing for Covid was negative. Currently continue with postnasal drip, no fever chills, mild cough with sputum production.  As for his blood pressure, he decided not to start amlodipine.  BPs improving. He denies chest pain, palpitations, nausea, vomiting, diarrhea. No headaches    Review of Systems See above   Past Medical History:  Diagnosis Date  . Anosmia   . Hypertension   . Obesity (BMI 30-39.9)   . Vitreous hemorrhage (HCC) 09/2018   R    Past Surgical History:  Procedure Laterality Date  . NECK SURGERY  ~ 2000   mass removed anteriorly, Dr Cloria Spring  . VASECTOMY  ~2000        Objective:   Physical Exam BP (!) 138/99 (BP Location: Left Arm, Patient Position: Sitting, Cuff Size: Normal)   Pulse 74   Temp (!) 97.1 F (36.2 C) (Temporal)   Resp 16   Ht 5\' 9"  (1.753 m)   Wt 253 lb 2 oz (114.8 kg)   SpO2 100%   BMI 37.38 kg/m  General:   Well developed, NAD, BMI noted. HEENT:  Normocephalic . Face symmetric, atraumatic Lungs:  Few rhonchi B , slt increased expiratory time Normal respiratory effort, no intercostal retractions, no accessory muscle use. Heart: RRR,  no murmur.  No pretibial edema bilaterally  Skin: Not pale. Not jaundice Neurologic:  alert & oriented X3.  Speech normal, gait appropriate for age and unassisted Psych--  Cognition and judgment appear intact.  Cooperative with normal attention span and concentration.  Behavior appropriate. No anxious or depressed appearing.      Assessment    Assessment  (transfer from [facility] 04/17/2016) HTN  Obesity Exercise  induced asthma OSA? HA-anosmia: saw neuro, last OV 11-2016; MRI (-), likely cervicogenic HA, pt declined Gabapentin, RX muscle relaxants, PT R Vitreous hemorrhage 09/2018  PLAN URI: See last  visit, he got a test for Covid (rapid and PCR) both negative, a flu test was negative as well as reported by patient.  Still has some nasal congestion which is better with Astelin. On exam he has some rhonchi, I presume bronchitis. Plan: Continue cough suppressant, Astelin.  Start a Z-Pak, call if not better. HTN: He call on 11/01/2019 with elevated BP and headaches.  Amlodipine was recommended, he decided not to take rather work on his lifestyle, is eating better and more active.  Headaches resolved. BPs at home still elevated: 158/105, today however at home was 142/85. I recheck it myself, left arm: 125/98. Plan: Continue losartan 100 mg and increase HCTZ to 25 mg, see AVS.  BMP in 2 weeks, continue with healthy lifestyle. Continue monitoring BPs. RTC 2 weeks BMP RTC routine visit 6 months     This visit occurred during the SARS-CoV-2 public health emergency.  Safety protocols were in place, including screening questions prior to the visit, additional usage of staff PPE, and extensive cleaning of exam room while observing appropriate contact time as indicated for disinfecting solutions.

## 2019-11-18 NOTE — Assessment & Plan Note (Signed)
URI: See last visit, he got a test for Covid (rapid and PCR) both negative, a flu test was negative as well as reported by patient.  Still has some nasal congestion which is better with Astelin. On exam he has some rhonchi, I presume bronchitis. Plan: Continue cough suppressant, Astelin.  Start a Z-Pak, call if not better. HTN: He call on 11/01/2019 with elevated BP and headaches.  Amlodipine was recommended, he decided not to take rather work on his lifestyle, is eating better and more active.  Headaches resolved. BPs at home still elevated: 158/105, today however at home was 142/85. I recheck it myself, left arm: 125/98. Plan: Continue losartan 100 mg and increase HCTZ to 25 mg, see AVS.  BMP in 2 weeks, continue with healthy lifestyle. Continue monitoring BPs. RTC 2 weeks BMP RTC routine visit 6 months

## 2019-12-04 ENCOUNTER — Other Ambulatory Visit: Payer: Self-pay

## 2019-12-04 ENCOUNTER — Other Ambulatory Visit (INDEPENDENT_AMBULATORY_CARE_PROVIDER_SITE_OTHER): Payer: BC Managed Care – PPO

## 2019-12-04 DIAGNOSIS — I10 Essential (primary) hypertension: Secondary | ICD-10-CM | POA: Diagnosis not present

## 2019-12-04 LAB — BASIC METABOLIC PANEL
BUN: 21 mg/dL (ref 6–23)
CO2: 28 mEq/L (ref 19–32)
Calcium: 8.9 mg/dL (ref 8.4–10.5)
Chloride: 102 mEq/L (ref 96–112)
Creatinine, Ser: 0.98 mg/dL (ref 0.40–1.50)
GFR: 78.54 mL/min (ref >60.00)
Glucose, Bld: 110 mg/dL — ABNORMAL HIGH (ref 70–99)
Potassium: 4 mEq/L (ref 3.5–5.1)
Sodium: 138 mEq/L (ref 135–145)

## 2019-12-06 ENCOUNTER — Encounter: Payer: Self-pay | Admitting: Internal Medicine

## 2019-12-07 ENCOUNTER — Other Ambulatory Visit: Payer: Self-pay | Admitting: Internal Medicine

## 2019-12-07 MED ORDER — BENZONATATE 200 MG PO CAPS: 200.0000 mg | ORAL_CAPSULE | Freq: Three times a day (TID) | ORAL | 0 refills | Status: DC | PRN

## 2019-12-21 ENCOUNTER — Encounter: Payer: Self-pay | Admitting: Internal Medicine

## 2019-12-22 ENCOUNTER — Other Ambulatory Visit: Payer: Self-pay | Admitting: Internal Medicine

## 2019-12-22 MED ORDER — BENZONATATE 200 MG PO CAPS: 200.0000 mg | ORAL_CAPSULE | Freq: Three times a day (TID) | ORAL | 0 refills | Status: DC | PRN

## 2019-12-22 MED ORDER — GUAIFENESIN-CODEINE 100-10 MG/5ML PO SOLN: 5.0000 mL | Freq: Every evening | ORAL | 0 refills | Status: DC | PRN

## 2020-01-04 ENCOUNTER — Encounter: Payer: Self-pay | Admitting: Internal Medicine

## 2020-01-07 ENCOUNTER — Ambulatory Visit: Payer: BC Managed Care – PPO | Admitting: Internal Medicine

## 2020-01-14 ENCOUNTER — Other Ambulatory Visit: Payer: Self-pay

## 2020-01-15 ENCOUNTER — Other Ambulatory Visit: Payer: Self-pay

## 2020-01-15 ENCOUNTER — Encounter: Payer: Self-pay | Admitting: Internal Medicine

## 2020-01-15 ENCOUNTER — Ambulatory Visit (INDEPENDENT_AMBULATORY_CARE_PROVIDER_SITE_OTHER): Payer: BC Managed Care – PPO | Admitting: Internal Medicine

## 2020-01-15 VITALS — BP 152/99 | HR 77 | Temp 96.7°F | Resp 18 | Ht 69.0 in | Wt 253.5 lb

## 2020-01-15 DIAGNOSIS — I10 Essential (primary) hypertension: Secondary | ICD-10-CM | POA: Diagnosis not present

## 2020-01-15 DIAGNOSIS — J45990 Exercise induced bronchospasm: Secondary | ICD-10-CM

## 2020-01-15 DIAGNOSIS — E669 Obesity, unspecified: Secondary | ICD-10-CM | POA: Diagnosis not present

## 2020-01-15 MED ORDER — ALBUTEROL SULFATE HFA 108 (90 BASE) MCG/ACT IN AERS: 2.0000 | INHALATION_SPRAY | Freq: Four times a day (QID) | RESPIRATORY_TRACT | 5 refills | Status: DC | PRN

## 2020-01-15 MED ORDER — LOSARTAN POTASSIUM-HCTZ 100-25 MG PO TABS: 1.0000 | ORAL_TABLET | Freq: Every day | ORAL | 5 refills | Status: DC

## 2020-01-15 NOTE — Patient Instructions (Addendum)
Stop losartan HCT 100-12.5 mg Stop hydrochlorothiazide 12.5 mg Start losartan HCT 100-25 mg 1 tablet daily  Continue with albuterol as needed If you notice you are using it more frequently let me know  Continue checking your blood pressures BP GOAL is between 110/65 and  135/85. If it is consistently higher or lower, let me know  You are doing great with your diet   Come back for blood work only in 3 weeks  Come back for physical exam in 6 months

## 2020-01-15 NOTE — Progress Notes (Signed)
Pre visit review using our clinic review tool, if applicable. No additional management support is needed unless otherwise documented below in the visit note. 

## 2020-01-15 NOTE — Progress Notes (Signed)
Subjective:    Patient ID: Christian Ramos, male    DOB: May 01, 1961, 59 y.o.   MRN: [id number]  DOS:  01/15/2020 Type of visit - description: Routine checkup Today we talk about her blood pressure, bronchospasm, obesity. Blood rate BPs are very good. Wheezing has increased lately since a recent URI.  Had also cough but that is improved  Wt Readings from Last 3 Encounters:  01/15/20 253 lb 8 oz (115 kg)  11/17/19 253 lb 2 oz (114.8 kg)  07/16/19 247 lb 4 oz (112.2 kg)     Review of Systems No fever chills Minimal postnasal dripping No chest pain or difficulty breathing    Past Medical History:  Diagnosis Date  . Anosmia   . Hypertension   . Obesity (BMI 30-39.9)   . Vitreous hemorrhage ([place]) 09/2018   R    Past Surgical History:  Procedure Laterality Date  . NECK SURGERY  ~ 2000   mass removed anteriorly, Dr Warren Danes  . VASECTOMY  ~2000    Social History   Socioeconomic History  . Marital status: Married    Spouse name: Not on file  . Number of children: 2  . Years of education: Not on file  . Highest education level: Not on file  Occupational History  . 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 Smoking status: Never Smoker  . Smokeless tobacco: Never Used  Substance and Sexual Activity  . Alcohol use: Yes    Alcohol/week: 0.0 standard drinks    Comment: Social  . Drug use: No  . Sexual activity: Not on file  Other Topics Concern  . Not on file  Social History Narrative   2 daughters, one married lives in [place], the other is a Equities trader in college   Social Determinants of Health   Financial Resource Strain:   . Difficulty of Paying Living Expenses:   Food Insecurity:   . Worried About Charity fundraiser in the Last Year:   . Arboriculturist in the Last Year:   Transportation Needs:   . Film/video editor (Medical):   Marland Kitchen Lack of Transportation (Non-Medical):   Physical Activity:   . Days of Exercise per Week:   . Minutes  of Exercise per Session:   Stress:   . Feeling of Stress :   Social Connections:   . Frequency of Communication with Friends and Family:   . Frequency of Social Gatherings with Friends and Family:   . Attends Religious Services:   . Active Member of Clubs or Organizations:   . Attends Archivist Meetings:   Marland Kitchen Marital Status:   Intimate Partner Violence:   . Fear of Current or Ex-Partner:   . Emotionally Abused:   Marland Kitchen Physically Abused:   . Sexually Abused:       Allergies as of 01/15/2020   No Known Allergies     Medication List       Accurate as of January 15, 2020  8:27 AM. If you have any questions, ask your nurse or doctor.        albuterol 108 (90 Base) MCG/ACT inhaler Commonly known as: Ventolin HFA Inhale 2 puffs into the lungs every 6 (six) hours as needed for wheezing or shortness of breath.   azelastine 0.1 % nasal spray Commonly known as: ASTELIN Place 2 sprays into both nostrils 2 (two) times daily.   benzonatate 200 MG capsule Commonly known as: TESSALON Take 1  capsule (200 mg total) by mouth 3 (three) times daily as needed for cough.   guaiFENesin-codeine 100-10 MG/5ML syrup Take 5 mLs by mouth at bedtime as needed for cough.   hydrochlorothiazide 12.5 MG tablet Commonly known as: HYDRODIURIL Take 1 tablet (12.5 mg total) by mouth daily.   loratadine 10 MG tablet Commonly known as: CLARITIN Take 10 mg by mouth daily.   losartan-hydrochlorothiazide 100-12.5 MG tablet Commonly known as: HYZAAR Take 1 tablet by mouth daily.          Objective:   Physical Exam BP (!) 152/99 (BP Location: Left Arm, Patient Position: Sitting, Cuff Size: Normal)   Pulse 77   Temp (!) 96.7 F (35.9 C) (Temporal)   Resp 18   Ht 5\' 9"  (1.753 m)   Wt 253 lb 8 oz (115 kg)   SpO2 100%   BMI 37.44 kg/m  General:   Well developed, NAD, BMI noted. HEENT:  Normocephalic . Face symmetric, atraumatic Lungs:  CTA B Normal respiratory effort, no intercostal  retractions, no accessory muscle use. Heart: RRR,  no murmur.  Lower extremities: no pretibial edema bilaterally  Skin: Not pale. Not jaundice Neurologic:  alert & oriented X3.  Speech normal, gait appropriate for age and unassisted Psych--  Cognition and judgment appear intact.  Cooperative with normal attention span and concentration.  Behavior appropriate. No anxious or depressed appearing.      Assessment     Assessment  (transfer from [facility]/17/2016) HTN  Obesity Exercise  induced asthma OSA? HA-anosmia: saw neuro, last OV 11-2016; MRI (-), likely cervicogenic HA, pt declined Gabapentin, RX muscle relaxants, PT R Vitreous hemorrhage 09/2018  PLAN HTN: Currently on losartan HCT 100-12.5 mg 1  tablet daily and sporadically takes hydrochlorothiazide 12.5 mg.  Ambulatory BPs range from 115/76 to 130s.  Well controlled. Plan: Switch to losartan HCT 100-25 mg every day, check BMP in 3 weeks.  Continue monitoring BPs. Exercise-induced asthma: Since he had a URI few weeks ago, he has noticed some wheezing even when he is not exercising and is using albuterol slightly more frequently.  Discussed probably add Qvar but that they we agreed on: Continue preexercise albuterol, use albuterol as needed for wheezing, if he is not back to baseline let me know.  Obesity: Since the last visit he gained a significant amount of weight, in the last 2 to 3 weeks he is back in track and he started to lose weight, praisedt. RTC labs 3 weeks CPX 6 months    This visit occurred during the SARS-CoV-2 public health emergency.  Safety protocols were in place, including screening questions prior to the visit, additional usage of staff PPE, and extensive cleaning of exam room while observing appropriate contact time as indicated for disinfecting solutions.

## 2020-01-16 NOTE — Assessment & Plan Note (Signed)
HTN: Currently on losartan HCT 100-12.5 mg 1  tablet daily and sporadically takes hydrochlorothiazide 12.5 mg.  Ambulatory BPs range from 115/76 to 130s.  Well controlled. Plan: Switch to losartan HCT 100-25 mg every day, check BMP in 3 weeks.  Continue monitoring BPs. Exercise-induced asthma: Since he had a URI few weeks ago, he has noticed some wheezing even when he is not exercising and is using albuterol slightly more frequently.  Discussed probably add Qvar but that they we agreed on: Continue preexercise albuterol, use albuterol as needed for wheezing, if he is not back to baseline let me know.  Obesity: Since the last visit he gained a significant amount of weight, in the last 2 to 3 weeks he is back in track and he started to lose weight, praisedt. RTC labs 3 weeks CPX 6 months

## 2020-02-02 ENCOUNTER — Other Ambulatory Visit: Payer: Self-pay

## 2020-02-03 ENCOUNTER — Other Ambulatory Visit: Payer: Self-pay

## 2020-02-03 ENCOUNTER — Other Ambulatory Visit (INDEPENDENT_AMBULATORY_CARE_PROVIDER_SITE_OTHER): Payer: BC Managed Care – PPO

## 2020-02-03 DIAGNOSIS — I10 Essential (primary) hypertension: Secondary | ICD-10-CM

## 2020-02-03 LAB — BASIC METABOLIC PANEL
BUN: 15 mg/dL (ref 6–23)
CO2: 28 mEq/L (ref 19–32)
Calcium: 9 mg/dL (ref 8.4–10.5)
Chloride: 102 mEq/L (ref 96–112)
Creatinine, Ser: 1.08 mg/dL (ref 0.40–1.50)
GFR: 70.17 mL/min (ref >60.00)
Glucose, Bld: 112 mg/dL — ABNORMAL HIGH (ref 70–99)
Potassium: 3.8 mEq/L (ref 3.5–5.1)
Sodium: 139 mEq/L (ref 135–145)

## 2020-03-07 DIAGNOSIS — B354 Tinea corporis: Secondary | ICD-10-CM | POA: Diagnosis not present

## 2020-03-07 DIAGNOSIS — Z85828 Personal history of other malignant neoplasm of skin: Secondary | ICD-10-CM | POA: Diagnosis not present

## 2020-03-07 DIAGNOSIS — L821 Other seborrheic keratosis: Secondary | ICD-10-CM | POA: Diagnosis not present

## 2020-03-07 DIAGNOSIS — L570 Actinic keratosis: Secondary | ICD-10-CM | POA: Diagnosis not present

## 2020-03-07 DIAGNOSIS — L578 Other skin changes due to chronic exposure to nonionizing radiation: Secondary | ICD-10-CM | POA: Diagnosis not present

## 2020-03-14 ENCOUNTER — Other Ambulatory Visit: Payer: Self-pay

## 2020-03-14 MED ORDER — LOSARTAN POTASSIUM-HCTZ 100-25 MG PO TABS: 1.0000 | ORAL_TABLET | Freq: Every day | ORAL | 1 refills | Status: DC

## 2020-05-05 ENCOUNTER — Encounter: Payer: Self-pay | Admitting: Internal Medicine

## 2020-05-10 ENCOUNTER — Encounter: Payer: Self-pay | Admitting: Internal Medicine

## 2020-05-17 ENCOUNTER — Ambulatory Visit: Payer: BC Managed Care – PPO | Admitting: Internal Medicine

## 2020-06-20 ENCOUNTER — Encounter: Payer: Self-pay | Admitting: Internal Medicine

## 2020-06-20 ENCOUNTER — Ambulatory Visit (HOSPITAL_BASED_OUTPATIENT_CLINIC_OR_DEPARTMENT_OTHER)
Admission: RE | Admit: 2020-06-20 | Discharge: 2020-06-20 | Disposition: A | Discharge status: Home / Self Care | Payer: BC Managed Care – PPO | Source: Ambulatory Visit | Attending: Internal Medicine | Admitting: Internal Medicine

## 2020-06-20 ENCOUNTER — Ambulatory Visit (INDEPENDENT_AMBULATORY_CARE_PROVIDER_SITE_OTHER): Payer: BC Managed Care – PPO | Admitting: Internal Medicine

## 2020-06-20 ENCOUNTER — Other Ambulatory Visit: Payer: Self-pay

## 2020-06-20 VITALS — BP 133/87 | HR 67 | Temp 98.1°F | Resp 16 | Ht 69.0 in | Wt 233.2 lb

## 2020-06-20 DIAGNOSIS — I10 Essential (primary) hypertension: Secondary | ICD-10-CM | POA: Diagnosis not present

## 2020-06-20 DIAGNOSIS — G4452 New daily persistent headache (NDPH): Secondary | ICD-10-CM

## 2020-06-20 DIAGNOSIS — R5383 Other fatigue: Secondary | ICD-10-CM

## 2020-06-20 DIAGNOSIS — R079 Chest pain, unspecified: Secondary | ICD-10-CM | POA: Diagnosis not present

## 2020-06-20 DIAGNOSIS — R519 Headache, unspecified: Secondary | ICD-10-CM | POA: Diagnosis not present

## 2020-06-20 NOTE — Patient Instructions (Addendum)
Okay to take Tylenol ibuprofen in the afternoon to help the headache.    GO TO THE LAB : Get the blood work     GO TO THE FRONT DESK, PLEASE SCHEDULE YOUR APPOINTMENTS Come back for a physical in 4 weeks   STOP BY THE FIRST FLOOR: Arrange for your CT of the head

## 2020-06-20 NOTE — Progress Notes (Signed)
Subjective:    Patient ID: Christian Ramos, male    DOB: Aug 21, 1961, 59 y.o.   MRN: [id number]  DOS:  06/20/2020 Type of visit - description: Acute About a year ago he decided to start exercising, he either run or walk ~ 3 miles almost daily. He has lost on his own scale 34 pounds and was feeling great.  4 weeks ago he started to feel fatigue, very sleepy at the end of the day, going to bed early. Interestingly wife mentioned that he is actually snoring less than before.  He also developed a headache.  Every day around 5 to 6 PM he starts with pain at the posterior neck , right side and it radiates upwards. He said that 1 of those episodes last week was the "worst headache of his life". No associated nausea, vomiting, visual disturbances.  He does feel a little foggy. Headache does not change with neck motion.  Also to 3 times a week he has some tightness at the chest, bilateral, at rest only, never exertional.  Wt Readings from Last 3 Encounters:  06/20/20 233 lb 4 oz (105.8 kg)  01/15/20 253 lb 8 oz (115 kg)  11/17/19 253 lb 2 oz (114.8 kg)     Review of Systems Denies fever chills No difficulty breathing No myalgias or arthralgias No nausea, vomiting or blood in the stools. Emotionally doing well. No recent airplane trips.  Past Medical History:  Diagnosis Date  . Anosmia   . Hypertension   . Obesity (BMI 30-39.9)   . Vitreous hemorrhage (HCC) 09/2018   R    Past Surgical History:  Procedure Laterality Date  . NECK SURGERY  ~ 2000   mass removed anteriorly, Dr Cloria Spring  . VASECTOMY  ~2000    Allergies as of 06/20/2020   No Known Allergies     Medication List       Accurate as of June 20, 2020  8:35 PM. If you have any questions, ask your nurse or doctor.        albuterol 108 (90 Base) MCG/ACT inhaler Commonly known as: Ventolin HFA Inhale 2 puffs into the lungs every 6 (six) hours as needed for wheezing or shortness of breath.   azelastine 0.1 %  nasal spray Commonly known as: ASTELIN Place 2 sprays into both nostrils 2 (two) times daily.   loratadine 10 MG tablet Commonly known as: CLARITIN Take 10 mg by mouth daily.   losartan-hydrochlorothiazide 100-25 MG tablet Commonly known as: HYZAAR Take 1 tablet by mouth daily.          Objective:   Physical Exam BP 133/87 (BP Location: Left Arm, Patient Position: Sitting, Cuff Size: Normal)   Pulse 67   Temp 98.1 F (36.7 C) (Oral)   Resp 16   Ht 5\' 9"  (1.753 m)   Wt 233 lb 4 oz (105.8 kg)   SpO2 98%   BMI 34.45 kg/m  General:   Well developed, NAD, BMI noted.  HEENT:  Normocephalic . Face symmetric, atraumatic Neck: No TTP cervical spine, range of motion normal Lungs:  CTA B Normal respiratory effort, no intercostal retractions, no accessory muscle use. Heart: RRR,  no murmur.  Abdomen:  Not distended, soft, non-tender. No rebound or rigidity.   Skin: Not pale. Not jaundice Lower extremities: no pretibial edema bilaterally  Neurologic:  alert & oriented X3.  Speech normal, gait appropriate for age and unassisted EOMI.  Motor symmetric, face symmetric. Psych--  Cognition and judgment appear  intact.  Cooperative with normal attention span and concentration.  Behavior appropriate. No anxious or depressed appearing.     Assessment       Assessment  (transfer from [facility] 04/17/2016) HTN  Obesity Exercise  induced asthma OSA? HA-anosmia: saw neuro, last OV 11-2016; MRI (-), likely cervicogenic HA, pt declined Gabapentin, RX muscle relaxants, PT R Vitreous hemorrhage 09/2018  PLAN Fatigue: Patient started to exercise and eat healthier a year ago, has lost ~ 36 pounds, he has developed fatigue for 4 weeks.  Wife noted that he is actually snoring less.  Epworth scale today 6 (-). Etiology unclear, no DOE. Check CMP, B12, folic acid. Over controlled HTN?  Recommend to start checking BPs whenever he feels fatigue Headache: As described above, daily for 4  to 5 weeks, one of the episode was the worst of his life, pain is starts at the posterior aspect of the neck. Cervicogenic headache?  Cluster? Plan: CT head, CBC, sed rate. Chest tightness: Unlikely to be cardiac clinical grounds, EKG today NSR, unchanged from previous HTN: Seems controlled, check FLP.  RTC 4 weeks  This visit occurred during the SARS-CoV-2 public health emergency.  Safety protocols were in place, including screening questions prior to the visit, additional usage of staff PPE, and extensive cleaning of exam room while observing appropriate contact time as indicated for disinfecting solutions.

## 2020-06-20 NOTE — Progress Notes (Signed)
Pre visit review using our clinic review tool, if applicable. No additional management support is needed unless otherwise documented below in the visit note. 

## 2020-06-20 NOTE — Assessment & Plan Note (Signed)
Fatigue: Patient started to exercise and eat healthier a year ago, has lost ~ 36 pounds, he has developed fatigue for 4 weeks.  Wife noted that he is actually snoring less.  Epworth scale today 6 (-). Etiology unclear, no DOE. Check CMP, B12, folic acid. Over controlled HTN?  Recommend to start checking BPs whenever he feels fatigue Headache: As described above, daily for 4 to 5 weeks, one of the episode was the worst of his life, pain is starts at the posterior aspect of the neck. Cervicogenic headache?  Cluster? Plan: CT head, CBC, sed rate. Chest tightness: Unlikely to be cardiac clinical grounds, EKG today NSR, unchanged from previous HTN: Seems controlled, check FLP.  RTC 4 weeks

## 2020-06-21 LAB — COMPREHENSIVE METABOLIC PANEL
AG Ratio: 1.5 (calc) (ref 1.0–2.5)
ALT: 21 U/L (ref 9–46)
AST: 19 U/L (ref 10–35)
Albumin: 4.1 g/dL (ref 3.6–5.1)
Alkaline phosphatase (APISO): 54 U/L (ref 35–144)
BUN: 14 mg/dL (ref 7–25)
CO2: 28 mmol/L (ref 20–32)
Calcium: 9.5 mg/dL (ref 8.6–10.3)
Chloride: 102 mmol/L (ref 98–110)
Creat: 0.98 mg/dL (ref 0.70–1.33)
Globulin: 2.8 g/dL (calc) (ref 1.9–3.7)
Glucose, Bld: 89 mg/dL (ref 65–99)
Potassium: 4.2 mmol/L (ref 3.5–5.3)
Sodium: 141 mmol/L (ref 135–146)
Total Bilirubin: 1.1 mg/dL (ref 0.2–1.2)
Total Protein: 6.9 g/dL (ref 6.1–8.1)

## 2020-06-21 LAB — CBC WITH DIFFERENTIAL/PLATELET
Absolute Monocytes: 624 cells/uL (ref 200–950)
Basophils Absolute: 62 cells/uL (ref 0–200)
Basophils Relative: 0.8 %
Eosinophils Absolute: 70 cells/uL (ref 15–500)
Eosinophils Relative: 0.9 %
HCT: 44.8 % (ref 38.5–50.0)
Hemoglobin: 15.6 g/dL (ref 13.2–17.1)
Lymphs Abs: 2168 cells/uL (ref 850–3900)
MCH: 31.5 pg (ref 27.0–33.0)
MCHC: 34.8 g/dL (ref 32.0–36.0)
MCV: 90.3 fL (ref 80.0–100.0)
MPV: 10 fL (ref 7.5–12.5)
Monocytes Relative: 8 %
Neutro Abs: 4875 cells/uL (ref 1500–7800)
Neutrophils Relative %: 62.5 %
Platelets: 349 10*3/uL (ref 140–400)
RBC: 4.96 10*6/uL (ref 4.20–5.80)
RDW: 12 % (ref 11.0–15.0)
Total Lymphocyte: 27.8 %
WBC: 7.8 10*3/uL (ref 3.8–10.8)

## 2020-06-21 LAB — LIPID PANEL
Cholesterol: 164 mg/dL (ref <200)
HDL: 47 mg/dL (ref >=40)
LDL Cholesterol (Calc): 86 mg/dL (calc)
Non-HDL Cholesterol (Calc): 117 mg/dL (calc) (ref <130)
Total CHOL/HDL Ratio: 3.5 (calc) (ref <5.0)
Triglycerides: 221 mg/dL — ABNORMAL HIGH (ref <150)

## 2020-06-21 LAB — SEDIMENTATION RATE: Sed Rate: 6 mm/h (ref 0–20)

## 2020-06-21 LAB — VITAMIN D 25 HYDROXY (VIT D DEFICIENCY, FRACTURES): Vit D, 25-Hydroxy: 31 ng/mL (ref 30–100)

## 2020-06-21 LAB — B12 AND FOLATE PANEL
Folate: 20 ng/mL
Vitamin B-12: 796 pg/mL (ref 200–1100)

## 2020-06-28 ENCOUNTER — Encounter: Payer: Self-pay | Admitting: Internal Medicine

## 2020-06-28 MED ORDER — ALBUTEROL SULFATE HFA 108 (90 BASE) MCG/ACT IN AERS: 2.0000 | INHALATION_SPRAY | Freq: Four times a day (QID) | RESPIRATORY_TRACT | 5 refills | Status: DC | PRN

## 2020-07-19 DIAGNOSIS — Z1211 Encounter for screening for malignant neoplasm of colon: Secondary | ICD-10-CM | POA: Diagnosis not present

## 2020-07-19 DIAGNOSIS — D123 Benign neoplasm of transverse colon: Secondary | ICD-10-CM | POA: Diagnosis not present

## 2020-07-19 DIAGNOSIS — D124 Benign neoplasm of descending colon: Secondary | ICD-10-CM | POA: Diagnosis not present

## 2020-07-19 DIAGNOSIS — D122 Benign neoplasm of ascending colon: Secondary | ICD-10-CM | POA: Diagnosis not present

## 2020-07-19 DIAGNOSIS — K648 Other hemorrhoids: Secondary | ICD-10-CM | POA: Diagnosis not present

## 2020-07-19 DIAGNOSIS — I10 Essential (primary) hypertension: Secondary | ICD-10-CM | POA: Diagnosis not present

## 2020-07-19 DIAGNOSIS — Z86010 Personal history of colonic polyps: Secondary | ICD-10-CM | POA: Diagnosis not present

## 2020-07-19 DIAGNOSIS — Z8719 Personal history of other diseases of the digestive system: Secondary | ICD-10-CM | POA: Diagnosis not present

## 2020-07-19 LAB — HM COLONOSCOPY

## 2020-07-21 ENCOUNTER — Encounter: Payer: BC Managed Care – PPO | Admitting: Internal Medicine

## 2020-07-25 ENCOUNTER — Encounter: Payer: Self-pay | Admitting: Internal Medicine

## 2020-07-26 ENCOUNTER — Other Ambulatory Visit: Payer: Self-pay

## 2020-07-26 MED ORDER — LOSARTAN POTASSIUM-HCTZ 100-25 MG PO TABS: 1.0000 | ORAL_TABLET | Freq: Every day | ORAL | 0 refills | Status: DC

## 2020-07-26 MED ORDER — LOSARTAN POTASSIUM-HCTZ 100-25 MG PO TABS
1.0000 | ORAL_TABLET | Freq: Every day | ORAL | 1 refills | Status: DC
Start: 2020-07-26 — End: 2020-07-29

## 2020-07-29 MED ORDER — LOSARTAN POTASSIUM-HCTZ 100-25 MG PO TABS: 1.0000 | ORAL_TABLET | Freq: Every day | ORAL | 0 refills | Status: DC

## 2020-07-29 NOTE — Addendum Note (Signed)
Addended byConrad [place] D on: 07/29/2020 07:33 AM   Modules accepted: Orders

## 2020-07-31 ENCOUNTER — Encounter: Payer: Self-pay | Admitting: Internal Medicine

## 2020-09-27 ENCOUNTER — Ambulatory Visit (INDEPENDENT_AMBULATORY_CARE_PROVIDER_SITE_OTHER): Payer: BC Managed Care – PPO | Admitting: Internal Medicine

## 2020-09-27 ENCOUNTER — Encounter: Payer: Self-pay | Admitting: Internal Medicine

## 2020-09-27 ENCOUNTER — Other Ambulatory Visit: Payer: Self-pay

## 2020-09-27 VITALS — BP 136/95 | HR 63 | Temp 97.5°F | Resp 18 | Ht 69.0 in | Wt 237.2 lb

## 2020-09-27 DIAGNOSIS — Z0000 Encounter for general adult medical examination without abnormal findings: Secondary | ICD-10-CM | POA: Diagnosis not present

## 2020-09-27 NOTE — Progress Notes (Signed)
Pre visit review using our clinic review tool, if applicable. No additional management support is needed unless otherwise documented below in the visit note. 

## 2020-09-27 NOTE — Progress Notes (Signed)
Subjective:    Patient ID: Christian Ramos, male    DOB: Mar 23, 1961, 59 y.o.   MRN: [id number]  DOS:  09/27/2020 Type of visit - description: CPX  Here for complete physical exam.  States he is feeling well. Specifically he continue running, denies chest pain, DOE, snoring or fatigue.  Wt Readings from Last 3 Encounters:  09/27/20 237 lb 4 oz (107.6 kg)  06/20/20 233 lb 4 oz (105.8 kg)  01/15/20 253 lb 8 oz (115 kg)     Review of Systems   A 14 point review of systems is negative    Past Medical History:  Diagnosis Date  . Anosmia   . Hypertension   . Obesity (BMI 30-39.9)   . Vitreous hemorrhage (HCC) 09/2018   R    Past Surgical History:  Procedure Laterality Date  . NECK SURGERY  ~ 2000   mass removed anteriorly, Dr Cloria Spring  . VASECTOMY  ~2000    Allergies as of 09/27/2020   No Known Allergies     Medication List       Accurate as of September 27, 2020 11:59 PM. If you have any questions, ask your nurse or doctor.        albuterol 108 (90 Base) MCG/ACT inhaler Commonly known as: Ventolin HFA Inhale 2 puffs into the lungs every 6 (six) hours as needed for wheezing or shortness of breath.   azelastine 0.1 % nasal spray Commonly known as: ASTELIN Place 2 sprays into both nostrils 2 (two) times daily.   loratadine 10 MG tablet Commonly known as: CLARITIN Take 10 mg by mouth daily.   losartan-hydrochlorothiazide 100-25 MG tablet Commonly known as: HYZAAR Take 1 tablet by mouth daily.          Objective:   Physical Exam BP (!) 136/95 (BP Location: Right Arm, Patient Position: Sitting, Cuff Size: Normal)   Pulse 63   Temp (!) 97.5 F (36.4 C) (Oral)   Resp 18   Ht 5\' 9"  (1.753 m)   Wt 237 lb 4 oz (107.6 kg)   SpO2 98%   BMI 35.04 kg/m  General: Well developed, NAD, BMI noted Neck: No  thyromegaly  HEENT:  Normocephalic . Face symmetric, atraumatic Lungs:  CTA B Normal respiratory effort, no intercostal retractions, no accessory  muscle use. Heart: RRR,  no murmur.  Abdomen:  Not distended, soft, non-tender. No rebound or rigidity.   Lower extremities: no pretibial edema bilaterally  Skin: Exposed areas without rash. Not pale. Not jaundice DRE: Normal sphincter tone, no stools, prostate normal Neurologic:  alert & oriented X3.  Speech normal, gait appropriate for age and unassisted Strength symmetric and appropriate for age.  Psych: Cognition and judgment appear intact.  Cooperative with normal attention span and concentration.  Behavior appropriate. No anxious or depressed appearing.     Assessment     Assessment  (transfer from [facility] 04/17/2016) HTN  Obesity Exercise  induced asthma OSA? HA-anosmia: saw neuro, last OV 11-2016; MRI (-), likely cervicogenic HA, pt declined Gabapentin, RX muscle relaxants, PT R Vitreous hemorrhage 09/2018  PLAN Here for CPX 10/2018 compliance with Hyzaar, ambulatory BP ranged from 109/82-129/83.  No change Obesity: Continue to do well with diet and exercise See last visit, fatigue, headache, chest tightness: All resolved RTC 1 year.    This visit occurred during the SARS-CoV-2 public health emergency.  Safety protocols were in place, including screening questions prior to the visit, additional usage of staff PPE, and extensive cleaning  of exam room while observing appropriate contact time as indicated for disinfecting solutions.

## 2020-09-27 NOTE — Patient Instructions (Signed)
Recommend to proceed with a Covid booster  Continue checking your blood pressures regularly BP GOAL is between 110/65 and  135/85. If it is consistently higher or lower, let me know   GO TO THE LAB : Get the blood work     GO TO THE FRONT DESK, PLEASE SCHEDULE YOUR APPOINTMENTS Come back for a physical exam in 1 year

## 2020-09-28 ENCOUNTER — Encounter: Payer: Self-pay | Admitting: Internal Medicine

## 2020-09-28 LAB — COMPREHENSIVE METABOLIC PANEL
ALT: 23 U/L (ref 0–53)
AST: 21 U/L (ref 0–37)
Albumin: 4.6 g/dL (ref 3.5–5.2)
Alkaline Phosphatase: 58 U/L (ref 39–117)
BUN: 19 mg/dL (ref 6–23)
CO2: 31 mEq/L (ref 19–32)
Calcium: 10 mg/dL (ref 8.4–10.5)
Chloride: 94 mEq/L — ABNORMAL LOW (ref 96–112)
Creatinine, Ser: 1.08 mg/dL (ref 0.40–1.50)
GFR: 75.44 mL/min (ref >60.00)
Glucose, Bld: 88 mg/dL (ref 70–99)
Potassium: 4.2 mEq/L (ref 3.5–5.1)
Sodium: 133 mEq/L — ABNORMAL LOW (ref 135–145)
Total Bilirubin: 1.3 mg/dL — ABNORMAL HIGH (ref 0.2–1.2)
Total Protein: 7.7 g/dL (ref 6.0–8.3)

## 2020-09-28 LAB — LIPID PANEL
Cholesterol: 245 mg/dL — ABNORMAL HIGH (ref 0–200)
HDL: 55.4 mg/dL (ref >39.00)
NonHDL: 189.51
Total CHOL/HDL Ratio: 4
Triglycerides: 247 mg/dL — ABNORMAL HIGH (ref 0.0–149.0)
VLDL: 49.4 mg/dL — ABNORMAL HIGH (ref 0.0–40.0)

## 2020-09-28 LAB — PSA: PSA: 0.39 ng/mL (ref 0.10–4.00)

## 2020-09-28 LAB — LDL CHOLESTEROL, DIRECT: Direct LDL: 131 mg/dL

## 2020-09-28 LAB — HEMOGLOBIN A1C: Hgb A1c MFr Bld: 5.4 % (ref 4.6–6.5)

## 2020-09-28 NOTE — Assessment & Plan Note (Signed)
Here for CPX EAV:WUJW compliance with Hyzaar, ambulatory BP ranged from 109/82-129/83.  No change Obesity: Continue to do well with diet and exercise See last visit, fatigue, headache, chest tightness: All resolved RTC 1 year.

## 2020-09-28 NOTE — Assessment & Plan Note (Signed)
-  Td 2014 -COVID vaccines x2, rec to get a booster  -Flu shot - declines  consistently   -CCS: per [facility]  cscope 03-10-2013: normal per pt, @ [facility] colonoscopy 07/19/2020 ([facility]), per patient: +polyps, 5 years -Prostate cancer screening:   + FH of prostate cancer, DRE today normal, no symptoms, check a PSA -Labs: CMP, A1c, PSA, FLP -Diet discussed.   Exercises regularly, continue to do well with diet.

## 2020-09-30 ENCOUNTER — Encounter: Payer: Self-pay | Admitting: Internal Medicine

## 2020-09-30 ENCOUNTER — Telehealth: Payer: Self-pay | Admitting: Internal Medicine

## 2020-09-30 NOTE — Telephone Encounter (Signed)
Biometric form faxed in.

## 2020-09-30 NOTE — Telephone Encounter (Signed)
Patient states he left a form for insurance company on 09/27/20 to be completed. Patient is calling checking the status of it.

## 2020-10-20 ENCOUNTER — Encounter: Payer: Self-pay | Admitting: Internal Medicine

## 2020-11-02 ENCOUNTER — Encounter: Payer: Self-pay | Admitting: Internal Medicine

## 2021-01-01 ENCOUNTER — Encounter: Payer: Self-pay | Admitting: Internal Medicine

## 2021-01-02 MED ORDER — ALBUTEROL SULFATE HFA 108 (90 BASE) MCG/ACT IN AERS: 2.0000 | INHALATION_SPRAY | Freq: Four times a day (QID) | RESPIRATORY_TRACT | 5 refills | Status: DC | PRN

## 2021-02-14 ENCOUNTER — Other Ambulatory Visit: Payer: Self-pay | Admitting: Internal Medicine

## 2021-03-01 DIAGNOSIS — L570 Actinic keratosis: Secondary | ICD-10-CM | POA: Diagnosis not present

## 2021-03-01 DIAGNOSIS — L578 Other skin changes due to chronic exposure to nonionizing radiation: Secondary | ICD-10-CM | POA: Diagnosis not present

## 2021-03-01 DIAGNOSIS — Z85828 Personal history of other malignant neoplasm of skin: Secondary | ICD-10-CM | POA: Diagnosis not present

## 2021-03-01 DIAGNOSIS — B354 Tinea corporis: Secondary | ICD-10-CM | POA: Diagnosis not present

## 2021-05-25 ENCOUNTER — Telehealth: Payer: Self-pay | Admitting: Internal Medicine

## 2021-05-25 NOTE — Telephone Encounter (Signed)
Pt had document at front office to pick up (Screening Form 1-page) Pt did not pick up. Document mailed to pt address.

## 2021-08-08 ENCOUNTER — Telehealth: Payer: Self-pay | Admitting: Internal Medicine

## 2021-08-08 DIAGNOSIS — I10 Essential (primary) hypertension: Secondary | ICD-10-CM

## 2021-08-08 DIAGNOSIS — Z0000 Encounter for general adult medical examination without abnormal findings: Secondary | ICD-10-CM

## 2021-08-08 NOTE — Telephone Encounter (Signed)
Please advise 

## 2021-08-08 NOTE — Telephone Encounter (Signed)
Patient has a CPE on 12.2.22. Patient would like to know if it is possible to have labs done sooner prior to appointment. Documentation will be given to Dr. Drue Novel the day of appointment to be completed that day for his insurance. Please advise.

## 2021-08-09 NOTE — Telephone Encounter (Signed)
LMOM informing Pt that orders have been placed, asked that he call back to schedule lab appt several days prior to cpx.

## 2021-08-09 NOTE — Telephone Encounter (Signed)
CMP, FLP, CBC, A1c, PSA 

## 2021-08-23 NOTE — Telephone Encounter (Signed)
Error

## 2021-09-06 ENCOUNTER — Encounter: Payer: BC Managed Care – PPO | Admitting: Internal Medicine

## 2021-09-06 DIAGNOSIS — Z85828 Personal history of other malignant neoplasm of skin: Secondary | ICD-10-CM | POA: Diagnosis not present

## 2021-09-06 DIAGNOSIS — L578 Other skin changes due to chronic exposure to nonionizing radiation: Secondary | ICD-10-CM | POA: Diagnosis not present

## 2021-09-06 DIAGNOSIS — D225 Melanocytic nevi of trunk: Secondary | ICD-10-CM | POA: Diagnosis not present

## 2021-09-06 DIAGNOSIS — B354 Tinea corporis: Secondary | ICD-10-CM | POA: Diagnosis not present

## 2021-09-06 DIAGNOSIS — L570 Actinic keratosis: Secondary | ICD-10-CM | POA: Diagnosis not present

## 2021-09-11 ENCOUNTER — Telehealth: Payer: BC Managed Care – PPO | Admitting: Physician Assistant

## 2021-09-11 DIAGNOSIS — J0140 Acute pansinusitis, unspecified: Secondary | ICD-10-CM

## 2021-09-11 DIAGNOSIS — H66002 Acute suppurative otitis media without spontaneous rupture of ear drum, left ear: Secondary | ICD-10-CM | POA: Diagnosis not present

## 2021-09-11 MED ORDER — BENZONATATE 100 MG PO CAPS: 100.0000 mg | ORAL_CAPSULE | Freq: Three times a day (TID) | ORAL | 0 refills | Status: DC | PRN

## 2021-09-11 MED ORDER — AMOXICILLIN-POT CLAVULANATE 875-125 MG PO TABS: 1.0000 | ORAL_TABLET | Freq: Two times a day (BID) | ORAL | 0 refills | Status: DC

## 2021-09-11 NOTE — Progress Notes (Signed)
Virtual Visit Consent   Christian Ramos, you are scheduled for a virtual visit with a [facility] provider today.     Just as with appointments in the office, your consent must be obtained to participate.  Your consent will be active for this visit and any virtual visit you may have with one of our providers in the next 365 days.     If you have a MyChart account, a copy of this consent can be sent to you electronically.  All virtual visits are billed to your insurance company just like a traditional visit in the office.    As this is a virtual visit, video technology does not allow for your provider to perform a traditional examination.  This may limit your provider's ability to fully assess your condition.  If your provider identifies any concerns that need to be evaluated in person or the need to arrange testing (such as labs, EKG, etc.), we will make arrangements to do so.     Although advances in technology are sophisticated, we cannot ensure that it will always work on either your end or our end.  If the connection with a video visit is poor, the visit may have to be switched to a telephone visit.  With either a video or telephone visit, we are not always able to ensure that we have a secure connection.     I need to obtain your verbal consent now.   Are you willing to proceed with your visit today?    LOCHLANN MASTRANGELO has provided verbal consent on 09/11/2021 for a virtual visit (video or telephone).   [name], PA-C   Date: 09/11/2021 7:47 AM   Virtual Visit via Video Note   I, [name], connected with  Christian Ramos  ([id number], [birth year]) on 09/11/21 at  7:45 AM EST by a video-enabled telemedicine application and verified that I am speaking with the correct person using two identifiers.  Location: Patient: Virtual Visit Location Patient: Home Provider: Virtual Visit Location Provider: Home Office   I discussed the limitations of evaluation and  management by telemedicine and the availability of in person appointments. The patient expressed understanding and agreed to proceed.    History of Present Illness: Christian Ramos is a 60 y.o. who identifies as a male who was assigned male at birth, and is being seen today for URI symptoms.  HPI: URI  This is a new problem. The current episode started in the past 7 days (Friday, 09/08/21). The problem has been gradually worsening. There has been no fever. Associated symptoms include congestion, coughing, ear pain, a plugged ear sensation, rhinorrhea, sinus pain and a sore throat. Associated symptoms comments: Post nasal drainage. Treatments tried: sudafed, mucinex, tylenol, aleve, advil. The treatment provided no relief.     Problems:  Patient Active Problem List   Diagnosis Date Noted   Anosmia 08/21/2018   Exercise-induced asthma 01/18/2017   Cervicogenic headache 01/18/2017   Snoring, OSA? 01/18/2017   Annual physical exam 07/09/2016   PCP NOTES >>>>>>>>>>>>>>>>> 04/18/2016   Hypertension    Obesity (BMI 30-39.9)     Allergies: No Known Allergies Medications:  Current Outpatient Medications:    amoxicillin-clavulanate (AUGMENTIN) 875-125 MG tablet, Take 1 tablet by mouth 2 (two) times daily., Disp: 14 tablet, Rfl: 0   benzonatate (TESSALON) 100 MG capsule, Take 1 capsule (100 mg total) by mouth 3 (three) times daily as needed., Disp: 30 capsule, Rfl: 0   albuterol (  VENTOLIN HFA) 108 (90 Base) MCG/ACT inhaler, Inhale 2 puffs into the lungs every 6 (six) hours as needed for wheezing or shortness of breath., Disp: 18 g, Rfl: 5   azelastine (ASTELIN) 0.1 % nasal spray, Place 2 sprays into both nostrils 2 (two) times daily., Disp: 30 mL, Rfl: 1   loratadine (CLARITIN) 10 MG tablet, Take 10 mg by mouth daily., Disp: , Rfl:    losartan-hydrochlorothiazide (HYZAAR) 100-25 MG tablet, Take 1 tablet by mouth daily., Disp: 90 tablet, Rfl: 2  Observations/Objective: Patient is well-developed,  well-nourished in no acute distress.  Resting comfortably at home.  Head is normocephalic, atraumatic.  No labored breathing.  Speech is clear and coherent with logical content.  Patient is alert and oriented at baseline.    Assessment and Plan: 1. Non-recurrent acute suppurative otitis media of left ear without spontaneous rupture of tympanic membrane - amoxicillin-clavulanate (AUGMENTIN) 875-125 MG tablet; Take 1 tablet by mouth 2 (two) times daily.  Dispense: 14 tablet; Refill: 0  2. Acute non-recurrent pansinusitis - amoxicillin-clavulanate (AUGMENTIN) 875-125 MG tablet; Take 1 tablet by mouth 2 (two) times daily.  Dispense: 14 tablet; Refill: 0 - benzonatate (TESSALON) 100 MG capsule; Take 1 capsule (100 mg total) by mouth 3 (three) times daily as needed.  Dispense: 30 capsule; Refill: 0  - Worsening symptoms that have not responded to OTC medications.  - Will give augmentin as below.  - Tessalon perles for cough - Continue allergy medications.  - Stay well hydrated and get plenty of rest.  - Call or seek in person evaluation if no symptom improvement or if symptoms worsen.   Follow Up Instructions: I discussed the assessment and treatment plan with the patient. The patient was provided an opportunity to ask questions and all were answered. The patient agreed with the plan and demonstrated an understanding of the instructions.  A copy of instructions were sent to the patient via MyChart unless otherwise noted below.    The patient was advised to call back or seek an in-person evaluation if the symptoms worsen or if the condition fails to improve as anticipated.  Time:  I spent 10 minutes with the patient via telehealth technology discussing the above problems/concerns.    [name], PA-C

## 2021-09-11 NOTE — Patient Instructions (Signed)
Christian Ramos, thank you for joining [name], PA-C for today's virtual visit.  While this provider is not your primary care provider (PCP), if your PCP is located in our provider database this encounter information will be shared with them immediately following your visit.  Consent: (Patient) Christian Ramos provided verbal consent for this virtual visit at the beginning of the encounter.  Current Medications:  Current Outpatient Medications:    amoxicillin-clavulanate (AUGMENTIN) 875-125 MG tablet, Take 1 tablet by mouth 2 (two) times daily., Disp: 14 tablet, Rfl: 0   benzonatate (TESSALON) 100 MG capsule, Take 1 capsule (100 mg total) by mouth 3 (three) times daily as needed., Disp: 30 capsule, Rfl: 0   albuterol (VENTOLIN HFA) 108 (90 Base) MCG/ACT inhaler, Inhale 2 puffs into the lungs every 6 (six) hours as needed for wheezing or shortness of breath., Disp: 18 g, Rfl: 5   azelastine (ASTELIN) 0.1 % nasal spray, Place 2 sprays into both nostrils 2 (two) times daily., Disp: 30 mL, Rfl: 1   loratadine (CLARITIN) 10 MG tablet, Take 10 mg by mouth daily., Disp: , Rfl:    losartan-hydrochlorothiazide (HYZAAR) 100-25 MG tablet, Take 1 tablet by mouth daily., Disp: 90 tablet, Rfl: 2   Medications ordered in this encounter:  Meds ordered this encounter  Medications   amoxicillin-clavulanate (AUGMENTIN) 875-125 MG tablet    Sig: Take 1 tablet by mouth 2 (two) times daily.    Dispense:  14 tablet    Refill:  0    Order Specific Question:   Supervising Provider    Answer:   MILLER, BRIAN [[id number]]   benzonatate (TESSALON) 100 MG capsule    Sig: Take 1 capsule (100 mg total) by mouth 3 (three) times daily as needed.    Dispense:  30 capsule    Refill:  0    Order Specific Question:   Supervising Provider    Answer:   Hyacinth Meeker, BRIAN [[id number]]     *If you need refills on other medications prior to your next appointment, please contact your pharmacy*  Follow-Up: Call back or seek  an in-person evaluation if the symptoms worsen or if the condition fails to improve as anticipated.  Other Instructions Sinusitis, Adult Sinusitis is inflammation of your sinuses. Sinuses are hollow spaces in the bones around your face. Your sinuses are located: Around your eyes. In the middle of your forehead. Behind your nose. In your cheekbones. Mucus normally drains out of your sinuses. When your nasal tissues become inflamed or swollen, mucus can become trapped or blocked. This allows bacteria, viruses, and fungi to grow, which leads to infection. Most infections of the sinuses are caused by a virus. Sinusitis can develop quickly. It can last for up to 4 weeks (acute) or for more than 12 weeks (chronic). Sinusitis often develops after a cold. What are the causes? This condition is caused by anything that creates swelling in the sinuses or stops mucus from draining. This includes: Allergies. Asthma. Infection from bacteria or viruses. Deformities or blockages in your nose or sinuses. Abnormal growths in the nose (nasal polyps). Pollutants, such as chemicals or irritants in the air. Infection from fungi (rare). What increases the risk? You are more likely to develop this condition if you: Have a weak body defense system (immune system). Do a lot of swimming or diving. Overuse nasal sprays. Smoke. What are the signs or symptoms? The main symptoms of this condition are pain and a feeling of pressure around the affected  sinuses. Other symptoms include: Stuffy nose or congestion. Thick drainage from your nose. Swelling and warmth over the affected sinuses. Headache. Upper toothache. A cough that may get worse at night. Extra mucus that collects in the throat or the back of the nose (postnasal drip). Decreased sense of smell and taste. Fatigue. A fever. Sore throat. Bad breath. How is this diagnosed? This condition is diagnosed based on: Your symptoms. Your medical  history. A physical exam. Tests to find out if your condition is acute or chronic. This may include: Checking your nose for nasal polyps. Viewing your sinuses using a device that has a light (endoscope). Testing for allergies or bacteria. Imaging tests, such as an MRI or CT scan. In rare cases, a bone biopsy may be done to rule out more serious types of fungal sinus disease. How is this treated? Treatment for sinusitis depends on the cause and whether your condition is chronic or acute. If caused by a virus, your symptoms should go away on their own within 10 days. You may be given medicines to relieve symptoms. They include: Medicines that shrink swollen nasal passages (topical intranasal decongestants). Medicines that treat allergies (antihistamines). A spray that eases inflammation of the nostrils (topical intranasal corticosteroids). Rinses that help get rid of thick mucus in your nose (nasal saline washes). If caused by bacteria, your health care provider may recommend waiting to see if your symptoms improve. Most bacterial infections will get better without antibiotic medicine. You may be given antibiotics if you have: A severe infection. A weak immune system. If caused by narrow nasal passages or nasal polyps, you may need to have surgery. Follow these instructions at home: Medicines Take, use, or apply over-the-counter and prescription medicines only as told by your health care provider. These may include nasal sprays. If you were prescribed an antibiotic medicine, take it as told by your health care provider. Do not stop taking the antibiotic even if you start to feel better. Hydrate and humidify  Drink enough fluid to keep your urine pale yellow. Staying hydrated will help to thin your mucus. Use a cool mist humidifier to keep the humidity level in your home above 50%. Inhale steam for 10-15 minutes, 3-4 times a day, or as told by your health care provider. You can do this in the  bathroom while a hot shower is running. Limit your exposure to cool or dry air. Rest Rest as much as possible. Sleep with your head raised (elevated). Make sure you get enough sleep each night. General instructions  Apply a warm, moist washcloth to your face 3-4 times a day or as told by your health care provider. This will help with discomfort. Wash your hands often with soap and water to reduce your exposure to germs. If soap and water are not available, use hand sanitizer. Do not smoke. Avoid being around people who are smoking (secondhand smoke). Keep all follow-up visits as told by your health care provider. This is important. Contact a health care provider if: You have a fever. Your symptoms get worse. Your symptoms do not improve within 10 days. Get help right away if: You have a severe headache. You have persistent vomiting. You have severe pain or swelling around your face or eyes. You have vision problems. You develop confusion. Your neck is stiff. You have trouble breathing. Summary Sinusitis is soreness and inflammation of your sinuses. Sinuses are hollow spaces in the bones around your face. This condition is caused by nasal tissues that  become inflamed or swollen. The swelling traps or blocks the flow of mucus. This allows bacteria, viruses, and fungi to grow, which leads to infection. If you were prescribed an antibiotic medicine, take it as told by your health care provider. Do not stop taking the antibiotic even if you start to feel better. Keep all follow-up visits as told by your health care provider. This is important. This information is not intended to replace advice given to you by your health care provider. Make sure you discuss any questions you have with your health care provider. Document Revised: 03/17/2018 Document Reviewed: 03/17/2018 Elsevier Patient Education  2022 [employer].    If you have been instructed to have an in-person evaluation today at  a local Urgent Care facility, please use the link below. It will take you to a list of all of our available [facility], including address, phone number and hours of operation. Please do not delay care.  [facility]  If you or a family member do not have a primary care provider, use the link below to schedule a visit and establish care. When you choose a [facility] primary care physician or advanced practice provider, you gain a long-term partner in health. Find a Primary Care Provider  Learn more about [facility]'s in-office and virtual care options: [facility] - Get Care Now

## 2021-09-26 ENCOUNTER — Other Ambulatory Visit (INDEPENDENT_AMBULATORY_CARE_PROVIDER_SITE_OTHER): Payer: BC Managed Care – PPO

## 2021-09-26 ENCOUNTER — Other Ambulatory Visit: Payer: Self-pay

## 2021-09-26 DIAGNOSIS — Z0000 Encounter for general adult medical examination without abnormal findings: Secondary | ICD-10-CM

## 2021-09-26 DIAGNOSIS — Z125 Encounter for screening for malignant neoplasm of prostate: Secondary | ICD-10-CM

## 2021-09-26 DIAGNOSIS — I10 Essential (primary) hypertension: Secondary | ICD-10-CM | POA: Diagnosis not present

## 2021-09-26 LAB — COMPREHENSIVE METABOLIC PANEL
ALT: 26 U/L (ref 0–53)
AST: 20 U/L (ref 0–37)
Albumin: 4.1 g/dL (ref 3.5–5.2)
Alkaline Phosphatase: 50 U/L (ref 39–117)
BUN: 11 mg/dL (ref 6–23)
CO2: 30 mEq/L (ref 19–32)
Calcium: 9.4 mg/dL (ref 8.4–10.5)
Chloride: 99 mEq/L (ref 96–112)
Creatinine, Ser: 1.01 mg/dL (ref 0.40–1.50)
GFR: 81.19 mL/min (ref >60.00)
Glucose, Bld: 104 mg/dL — ABNORMAL HIGH (ref 70–99)
Potassium: 4 mEq/L (ref 3.5–5.1)
Sodium: 139 mEq/L (ref 135–145)
Total Bilirubin: 0.9 mg/dL (ref 0.2–1.2)
Total Protein: 6.8 g/dL (ref 6.0–8.3)

## 2021-09-26 LAB — CBC WITH DIFFERENTIAL/PLATELET
Basophils Absolute: 0.1 10*3/uL (ref 0.0–0.1)
Basophils Relative: 1.4 % (ref 0.0–3.0)
Eosinophils Absolute: 0.2 10*3/uL (ref 0.0–0.7)
Eosinophils Relative: 2.4 % (ref 0.0–5.0)
HCT: 43.7 % (ref 39.0–52.0)
Hemoglobin: 15 g/dL (ref 13.0–17.0)
Lymphocytes Relative: 30.6 % (ref 12.0–46.0)
Lymphs Abs: 2.1 10*3/uL (ref 0.7–4.0)
MCHC: 34.2 g/dL (ref 30.0–36.0)
MCV: 91.3 fl (ref 78.0–100.0)
Monocytes Absolute: 0.6 10*3/uL (ref 0.1–1.0)
Monocytes Relative: 9.4 % (ref 3.0–12.0)
Neutro Abs: 3.8 10*3/uL (ref 1.4–7.7)
Neutrophils Relative %: 56.2 % (ref 43.0–77.0)
Platelets: 333 10*3/uL (ref 150.0–400.0)
RBC: 4.78 Mil/uL (ref 4.22–5.81)
RDW: 12.5 % (ref 11.5–15.5)
WBC: 6.7 10*3/uL (ref 4.0–10.5)

## 2021-09-26 LAB — LIPID PANEL
Cholesterol: 216 mg/dL — ABNORMAL HIGH (ref 0–200)
HDL: 42.1 mg/dL (ref >39.00)
NonHDL: 173.65
Total CHOL/HDL Ratio: 5
Triglycerides: 264 mg/dL — ABNORMAL HIGH (ref 0.0–149.0)
VLDL: 52.8 mg/dL — ABNORMAL HIGH (ref 0.0–40.0)

## 2021-09-26 LAB — HEMOGLOBIN A1C: Hgb A1c MFr Bld: 5.7 % (ref 4.6–6.5)

## 2021-09-26 LAB — PSA: PSA: 0.53 ng/mL (ref 0.10–4.00)

## 2021-09-26 LAB — LDL CHOLESTEROL, DIRECT: Direct LDL: 95 mg/dL

## 2021-09-29 ENCOUNTER — Ambulatory Visit (INDEPENDENT_AMBULATORY_CARE_PROVIDER_SITE_OTHER): Payer: BC Managed Care – PPO | Admitting: Internal Medicine

## 2021-09-29 ENCOUNTER — Telehealth: Payer: Self-pay

## 2021-09-29 ENCOUNTER — Encounter: Payer: BC Managed Care – PPO | Admitting: Internal Medicine

## 2021-09-29 ENCOUNTER — Encounter: Payer: Self-pay | Admitting: Internal Medicine

## 2021-09-29 VITALS — BP 136/74 | HR 104 | Temp 97.6°F | Resp 16 | Ht 69.0 in | Wt 255.5 lb

## 2021-09-29 DIAGNOSIS — E785 Hyperlipidemia, unspecified: Secondary | ICD-10-CM | POA: Diagnosis not present

## 2021-09-29 DIAGNOSIS — I10 Essential (primary) hypertension: Secondary | ICD-10-CM | POA: Diagnosis not present

## 2021-09-29 DIAGNOSIS — Z0001 Encounter for general adult medical examination with abnormal findings: Secondary | ICD-10-CM | POA: Diagnosis not present

## 2021-09-29 DIAGNOSIS — R519 Headache, unspecified: Secondary | ICD-10-CM

## 2021-09-29 DIAGNOSIS — G8929 Other chronic pain: Secondary | ICD-10-CM

## 2021-09-29 DIAGNOSIS — G4452 New daily persistent headache (NDPH): Secondary | ICD-10-CM

## 2021-09-29 DIAGNOSIS — Z0000 Encounter for general adult medical examination without abnormal findings: Secondary | ICD-10-CM

## 2021-09-29 MED ORDER — ATORVASTATIN CALCIUM 20 MG PO TABS: 20.0000 mg | ORAL_TABLET | Freq: Every day | ORAL | 0 refills | Status: DC

## 2021-09-29 NOTE — Telephone Encounter (Signed)
Biometric screening completed and faxed to [facility] at [phone]. Original form given back to Pt and copy sent for scanning. Received fax confirmation.

## 2021-09-29 NOTE — Assessment & Plan Note (Signed)
-  Td 2014 - shingrix d/w pt  -COVID vaccine  booster recommended -Flu shot - declined today and consistently   -CCS: per [facility]  cscope 03-10-2013: normal per pt, @ [facility] colonoscopy 07/19/2020 ([facility]), per patient: +polyps, 5 years -Prostate cancer screening:   + FH of prostate cancer, DRE 2021 wnl, PSA 08-2021 wnl -Labs done few days ago: All reviewed with patient. -Diet discussed: wt gain, plans to exercise more  -Advance care planning package of information provided

## 2021-09-29 NOTE — Patient Instructions (Signed)
Check the  blood pressure   BP GOAL is between 110/65 and  135/85. If it is consistently higher or lower, let me know  Start atorvastatin 20 mg every night. Prescription sent to [facility]  Come back in 6 weeks to repeat your cholesterol levels.  Make an appointment   GO TO THE FRONT DESK, PLEASE SCHEDULE YOUR APPOINTMENTS Come back for blood work in 6 weeks  Come back for a checkup in 6 months

## 2021-09-29 NOTE — Progress Notes (Signed)
Subjective:    Patient ID: Christian Ramos, male    DOB: December 19, 1960, 60 y.o.   MRN: [id number]  DOS:  09/29/2021 Type of visit - description: CPX  Here for CPX. Today we had a extensive discussion about his labs, family history, cholesterol levels, headaches. He has a history of on and off headaches.  For the last 4 weeks is having headache almost daily. The pain starts at the right side of the posterior neck and radiates upwards. No associated nausea or vomiting. No photo or phonophobia. There is a slightly sensitive to touch. A family member had Relpax, he took 1 dose and felt better.  BPs has been elevated lately.  Reports a lot of stress.  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  Tobacco Use   Smoking status: Never   Smokeless tobacco: Never  Substance and Sexual Activity   Alcohol use: Yes    Alcohol/week: 0.0 standard drinks    Comment: Social   Drug use: No   Sexual activity: Not on file  Other Topics Concern   Not on file  Social History Narrative   2 daughters, one married lives in [place], other is  married, lives in [place]        Social Determinants of Health   Financial Resource Strain: Not on file  Food Insecurity: Not on file  Transportation Needs: Not on file  Physical Activity: Not on file  Stress: Not on file  Social Connections: Not on file  Intimate Partner  Violence: Not on file     Allergies as of 09/29/2021   No Known Allergies      Medication List        Accurate as of September 29, 2021  4:55 PM. If you have any questions, ask your nurse or doctor.          STOP taking these medications    amoxicillin-clavulanate 875-125 MG tablet Commonly known as: AUGMENTIN Stopped by: Willow Ora, MD   benzonatate 100 MG capsule Commonly known as: TESSALON Stopped by: Willow Ora, MD       TAKE these medications    albuterol 108 (90 Base) MCG/ACT inhaler Commonly known as: Ventolin HFA Inhale 2 puffs into the lungs every 6 (six) hours as needed for wheezing or shortness of breath.   atorvastatin 20 MG tablet Commonly known as: LIPITOR Take 1 tablet (20 mg total) by mouth daily. Started by: Willow Ora, MD   azelastine 0.1 % nasal spray Commonly known as: ASTELIN Place 2 sprays into both nostrils 2 (two) times daily.   loratadine 10 MG tablet Commonly known as: CLARITIN Take 10 mg by mouth daily.   losartan-hydrochlorothiazide 100-25 MG tablet Commonly known as: HYZAAR Take 1 tablet by mouth daily.  Objective:   Physical Exam BP 136/74 (BP Location: Left Arm, Patient Position: Sitting, Cuff Size: Normal)   Pulse (!) 104   Temp 97.6 F (36.4 C) (Oral)   Resp 16   Ht 5\' 9"  (1.753 m)   Wt 255 lb 8 oz (115.9 kg)   SpO2 97%   BMI 37.73 kg/m  General: Well developed, NAD, BMI noted Neck: No  thyromegaly  HEENT:  Normocephalic . Face symmetric, atraumatic Lungs:  CTA B Normal respiratory effort, no intercostal retractions, no accessory muscle use. Heart: RRR,  no murmur.  Abdomen:  Not distended, soft, non-tender. No rebound or rigidity.   Lower extremities: no pretibial edema bilaterally  Skin: Exposed areas without rash. Not pale. Not jaundice Neurologic:  alert & oriented X3.  Speech normal, gait appropriate for age and unassisted Strength symmetric and appropriate for age.  Psych: Cognition and  judgment appear intact.  Cooperative with normal attention span and concentration.  Behavior appropriate. No anxious or depressed appearing.     Assessment     Assessment  (transfer from [facility] 04/17/2016) HTN  Obesity Exercise  induced asthma OSA? HA-anosmia: saw neuro, last OV 11-2016; MRI (-), likely cervicogenic HA, pt declined Gabapentin, RX muscle relaxants, PT R Vitreous hemorrhage 09/2018  PLAN Here for CPX HTN: BP has been elevated at times lately, he admits to stress, has taken an extra Hyzaar now and then.  Last BMP okay.  Recommend low-salt diet, increase physical activity, stay on Hyzaar 1 tablet daily, call if not controlled. Add  BBs ? Headache: As described above, symptoms have resurfaced, refer back to neurology.  Gabapentin?  Declines.  Hyperlipidemia: Last cholesterol panel showed LDL of 95, he has a + FH, CV RF 10 years is 12.5%.  He qualify for statins, we had a long discussion about pros/cons.  He agreed to Lipitor and labs in 6 weeks. RTC labs 6 weeks RTC checkup 6 months   In addition to CPX, we discussed hypertension, headaches, hyperlipidemia and the need to take statins.  This visit occurred during the SARS-CoV-2 public health emergency.  Safety protocols were in place, including screening questions prior to the visit, additional usage of staff PPE, and extensive cleaning of exam room while observing appropriate contact time as indicated for disinfecting solutions.

## 2021-09-29 NOTE — Assessment & Plan Note (Signed)
Here for CPX HTN: BP has been elevated at times lately, he admits to stress, has taken an extra Hyzaar now and then.  Last BMP okay.  Recommend low-salt diet, increase physical activity, stay on Hyzaar 1 tablet daily, call if not controlled. Add  BBs ? Headache: As described above, symptoms have resurfaced, refer back to neurology.  Gabapentin?  Declines.  Hyperlipidemia: Last cholesterol panel showed LDL of 95, he has a + FH, CV RF 10 years is 12.5%.  He qualify for statins, we had a long discussion about pros/cons.  He agreed to Lipitor and labs in 6 weeks. RTC labs 6 weeks RTC checkup 6 months

## 2021-10-02 ENCOUNTER — Encounter: Payer: Self-pay | Admitting: Neurology

## 2021-10-04 ENCOUNTER — Telehealth: Payer: BC Managed Care – PPO | Admitting: Physician Assistant

## 2021-10-04 DIAGNOSIS — H9202 Otalgia, left ear: Secondary | ICD-10-CM

## 2021-10-04 DIAGNOSIS — J029 Acute pharyngitis, unspecified: Secondary | ICD-10-CM | POA: Diagnosis not present

## 2021-10-05 MED ORDER — PROMETHAZINE-DM 6.25-15 MG/5ML PO SYRP: 5.0000 mL | ORAL_SOLUTION | Freq: Four times a day (QID) | ORAL | 0 refills | Status: DC | PRN

## 2021-10-05 MED ORDER — AMOXICILLIN 500 MG PO TABS: 500.0000 mg | ORAL_TABLET | Freq: Two times a day (BID) | ORAL | 0 refills | Status: DC

## 2021-10-05 NOTE — Addendum Note (Signed)
Addended by: Waldon Merl on: 10/05/2021 05:44 PM   Modules accepted: Orders

## 2021-10-05 NOTE — Progress Notes (Signed)
  E-Visit for Sore Throat  We are sorry that you are not feeling well.  Here is how we plan to help!  Your symptoms indicate a likely viral infection (Pharyngitis).   Pharyngitis is inflammation in the back of the throat which can cause a sore throat, scratchiness and sometimes difficulty swallowing.   Pharyngitis is typically caused by a respiratory virus and will just run its course.  Please keep in mind that your symptoms could last up to 10 days.  For throat pain, we recommend over the counter oral pain relief medications such as acetaminophen or aspirin, or anti-inflammatory medications such as ibuprofen or naproxen sodium.  Topical treatments such as oral throat lozenges or sprays may be used as needed.  Avoid close contact with loved ones, especially the very young and elderly.  Remember to wash your hands thoroughly throughout the day as this is the number one way to prevent the spread of infection and wipe down door knobs and counters with disinfectant.  Giving the left consistent ear pain, there is also concern of you developing a true bacterial ear infection. Because of this I am sending in an antibiotic, Amoxicillin for you to take twice daily as directed with food.   Home Care: Only take medications as instructed by your medical team. Do not drink alcohol while taking these medications. A steam or ultrasonic humidifier can help congestion.  You can place a towel over your head and breathe in the steam from hot water coming from a faucet. Avoid close contacts especially the very young and the elderly. Cover your mouth when you cough or sneeze. Always remember to wash your hands.  Get Help Right Away If: You develop worsening fever or throat pain. You develop a severe head ache or visual changes. Your symptoms persist after you have completed your treatment plan.  Make sure you Understand these instructions. Will watch your condition. Will get help right away if you are not doing  well or get worse.   Thank you for choosing an e-visit.  Your e-visit answers were reviewed by a board certified advanced clinical practitioner to complete your personal care plan. Depending upon the condition, your plan could have included both over the counter or prescription medications.  Please review your pharmacy choice. Make sure the pharmacy is open so you can pick up prescription now. If there is a problem, you may contact your provider through [employer] and have the prescription routed to another pharmacy.  Your safety is important to [place]. If you have drug allergies check your prescription carefully.   For the next 24 hours you can use MyChart to ask questions about today's visit, request a non-urgent call back, or ask for a work or school excuse. You will get an email in the next two days asking about your experience. I hope that your e-visit has been valuable and will speed your recovery.

## 2021-10-05 NOTE — Progress Notes (Signed)
I have spent 5 minutes in review of e-visit questionnaire, review and updating patient chart, medical decision making and response to patient.   Sarabi Sockwell Cody Ahtziry Saathoff, PA-C    

## 2021-10-15 ENCOUNTER — Telehealth: Payer: BC Managed Care – PPO | Admitting: Physician Assistant

## 2021-10-15 DIAGNOSIS — R062 Wheezing: Secondary | ICD-10-CM

## 2021-10-15 DIAGNOSIS — R053 Chronic cough: Secondary | ICD-10-CM

## 2021-10-15 DIAGNOSIS — J019 Acute sinusitis, unspecified: Secondary | ICD-10-CM

## 2021-10-15 MED ORDER — PREDNISONE 10 MG PO TABS: ORAL_TABLET | ORAL | 0 refills | Status: DC

## 2021-10-15 MED ORDER — DOXYCYCLINE HYCLATE 100 MG PO CAPS: 100.0000 mg | ORAL_CAPSULE | Freq: Two times a day (BID) | ORAL | 0 refills | Status: DC

## 2021-10-15 MED ORDER — PROMETHAZINE-DM 6.25-15 MG/5ML PO SYRP: 5.0000 mL | ORAL_SOLUTION | Freq: Four times a day (QID) | ORAL | 0 refills | Status: DC | PRN

## 2021-10-15 NOTE — Progress Notes (Signed)
We are sorry that you are not feeling well.  Here is how we plan to help!  Based on your presentation I believe you most likely have A cough due to bacteria.  When patients have a fever and a productive cough with a change in color or increased sputum production, we are concerned about bacterial bronchitis.  If left untreated it can progress to pneumonia.  If your symptoms do not improve with your treatment plan it is important that you contact your provider.   I have prescribed Doxycycline 100 mg twice a day for 7 days  It does appear you just finished a course of antibiotics.  If this is not resolved by the end of this course, you will need to have a face-to-face visit.   In addition you may use A non-prescription cough medication called Mucinex DM: take 2 tablets every 12 hours. I will also refill the promethazine cough syrup.  Prednisone 10 mg daily for 6 days (see taper instructions below)  Directions for 6 day taper: Day 1: 2 tablets before breakfast, 1 after both lunch & dinner and 2 at bedtime Day 2: 1 tab before breakfast, 1 after both lunch & dinner and 2 at bedtime Day 3: 1 tab at each meal & 1 at bedtime Day 4: 1 tab at breakfast, 1 at lunch, 1 at bedtime Day 5: 1 tab at breakfast & 1 tab at bedtime Day 6: 1 tab at breakfast  From your responses in the eVisit questionnaire you describe inflammation in the upper respiratory tract which is causing a significant cough.  This is commonly called Bronchitis and has four common causes:   Allergies Viral Infections Acid Reflux Bacterial Infection Allergies, viruses and acid reflux are treated by controlling symptoms or eliminating the cause. An example might be a cough caused by taking certain blood pressure medications. You stop the cough by changing the medication. Another example might be a cough caused by acid reflux. Controlling the reflux helps control the cough.  USE OF BRONCHODILATOR ("RESCUE") INHALERS: There is a risk from using  your bronchodilator too frequently.  The risk is that over-reliance on a medication which only relaxes the muscles surrounding the breathing tubes can reduce the effectiveness of medications prescribed to reduce swelling and congestion of the tubes themselves.  Although you feel brief relief from the bronchodilator inhaler, your asthma may actually be worsening with the tubes becoming more swollen and filled with mucus.  This can delay other crucial treatments, such as oral steroid medications. If you need to use a bronchodilator inhaler daily, several times per day, you should discuss this with your provider.  There are probably better treatments that could be used to keep your asthma under control.     HOME CARE Only take medications as instructed by your medical team. Complete the entire course of an antibiotic. Drink plenty of fluids and get plenty of rest. Avoid close contacts especially the very young and the elderly Cover your mouth if you cough or cough into your sleeve. Always remember to wash your hands A steam or ultrasonic humidifier can help congestion.   GET HELP RIGHT AWAY IF: You develop worsening fever. You become short of breath You cough up blood. Your symptoms persist after you have completed your treatment plan MAKE SURE YOU  Understand these instructions. Will watch your condition. Will get help right away if you are not doing well or get worse.    Thank you for choosing an e-visit.  Your  e-visit answers were reviewed by a board certified advanced clinical practitioner to complete your personal care plan. Depending upon the condition, your plan could have included both over the counter or prescription medications.  Please review your pharmacy choice. Make sure the pharmacy is open so you can pick up prescription now. If there is a problem, you may contact your provider through [employer] and have the prescription routed to another pharmacy.  Your safety is  important to [place]. If you have drug allergies check your prescription carefully.   For the next 24 hours you can use MyChart to ask questions about today's visit, request a non-urgent call back, or ask for a work or school excuse. You will get an email in the next two days asking about your experience. I hope that your e-visit has been valuable and will speed your recovery.  Greater than 5 minutes, yet less than 10 minutes of time have been spent researching, coordinating, and implementing care for this patient today

## 2021-10-17 ENCOUNTER — Telehealth: Payer: BC Managed Care – PPO | Admitting: Family Medicine

## 2021-10-17 DIAGNOSIS — R053 Chronic cough: Secondary | ICD-10-CM

## 2021-10-17 NOTE — Progress Notes (Signed)
[place]  Needs in person eval does not seem to be responding to treatment

## 2021-11-03 ENCOUNTER — Encounter: Payer: Self-pay | Admitting: Internal Medicine

## 2021-11-06 ENCOUNTER — Telehealth: Payer: Self-pay | Admitting: Internal Medicine

## 2021-11-06 ENCOUNTER — Telehealth: Payer: Self-pay

## 2021-11-06 MED ORDER — TIZANIDINE HCL 4 MG PO CAPS: ORAL_CAPSULE | ORAL | 0 refills | Status: DC

## 2021-11-06 MED ORDER — ELETRIPTAN HYDROBROMIDE 20 MG PO TABS: 20.0000 mg | ORAL_TABLET | ORAL | 0 refills | Status: DC | PRN

## 2021-11-06 NOTE — Telephone Encounter (Signed)
PA initiated via Covermymeds; KEY: [id number]. PA approved.  SLHTDS:[id number];BWIOMB:[id number];Review Type:Prior Auth;Coverage Start Date:10/07/2021;Coverage End Date:11/06/2022

## 2021-11-06 NOTE — Telephone Encounter (Signed)
I spoke with the patient.  Continue with headaches, almost every day. Started at the "base of the skull" on the right side and radiates upwards to the temple. No associated nausea, vomiting, visual disturbances, fever chills or weight loss. Relpax has helped to decrease the pain consistently. On chart review he had a round of prednisone last month for an unrelated issue and that did not help with a headache. While I await for the neurology consult we will do the following: - Relpax 20 mg 1 or 2 a day, no more frequent than every 24 hours. - Tizanidine nightly and daytime prn, watch for drowsiness.

## 2021-11-09 ENCOUNTER — Other Ambulatory Visit: Payer: Self-pay

## 2021-11-09 DIAGNOSIS — G4452 New daily persistent headache (NDPH): Secondary | ICD-10-CM

## 2021-11-10 ENCOUNTER — Other Ambulatory Visit (INDEPENDENT_AMBULATORY_CARE_PROVIDER_SITE_OTHER): Payer: BC Managed Care – PPO

## 2021-11-10 DIAGNOSIS — E785 Hyperlipidemia, unspecified: Secondary | ICD-10-CM | POA: Diagnosis not present

## 2021-11-10 LAB — LIPID PANEL
Cholesterol: 204 mg/dL — ABNORMAL HIGH (ref 0–200)
HDL: 44.4 mg/dL (ref >39.00)
NonHDL: 159.54
Total CHOL/HDL Ratio: 5
Triglycerides: 211 mg/dL — ABNORMAL HIGH (ref 0.0–149.0)
VLDL: 42.2 mg/dL — ABNORMAL HIGH (ref 0.0–40.0)

## 2021-11-10 LAB — LDL CHOLESTEROL, DIRECT: Direct LDL: 117 mg/dL

## 2021-11-10 LAB — ALT: ALT: 27 U/L (ref 0–53)

## 2021-11-10 LAB — AST: AST: 20 U/L (ref 0–37)

## 2021-11-13 ENCOUNTER — Other Ambulatory Visit: Payer: Self-pay | Admitting: Internal Medicine

## 2021-11-22 NOTE — Progress Notes (Signed)
NEUROLOGY CONSULTATION NOTE  Christian Ramos MRN: [id number] DOB: [birth year]  Referring provider: Willow Ora, MD Primary care provider: Willow Ora, MD  Reason for consult:  headache  Assessment/Plan:   Cervicogenic migraine Right sided occipital neuralgia  Start gabapentin 100mg  titrating to 200mg  at bedtime as preventative.  If he starts having increased headaches, titrate up as needed.  If headaches become more persistent, consider occipital nerve block Eletriptan 20mg  for abortive therapy. Limit use of pain relievers to no more than 2 days out of week to prevent risk of rebound or medication-overuse headache. Keep headache diary Follow up 4 months.   Subjective:  Christian Ramos is a 61 year old right-handed man hypertension who follows up for cervicogenic headache   HISTORY: Last seen in 2018.    At that time, he reported onset of new headache 6-7/10 non-throbbing/pressure pain around 2016.  Starts at base of right side of head and radiates to right temporal-parietal region.  He calls it a pressure pain.  No numbness or tingling.  He denies pain radiating down the neck or into the arm.  When he moves his neck, he feels "crunching".  No aura.  Associated bilateral blurred vision, photophobia, phonophobia and a little lightheaded.  No nausea.  At that time, he reported headaches lasted 12 hours and occurred 3 to 4 days a week.  No triggers or relieving factors.  Around the same time, he reported anosmia.  MRI of brain with and without contrast was performed on 09/26/16 to rule out mass lesion or other abnormality affecting the olfactory bulbs was personally reviewed and was normal.  Would treat with ibuprofen or Excedrin.  Tizanidine at night helped.  He was prescribed gabapentin but never started it as he was concerned about taking an anti-seizure medication.  He wished to avoid medications, so I referred him to Dr. 09-27-1989 of Sports Medicine for [facility].  He didn't see Dr. 2017 but  he reportedly received some kind of physical therapy which helped briefly.  He saw a spine surgeon who ordered X-rays as well as MRI which reportedly showed no structural cause for headaches.  I do not have these notes, images and patient does not remember who he saw.    He continued having headaches.  CT head on 06/20/2020 personally reviewed was normal.  By late 2022, they became frequent.  They would occur 10-20 days a month.  Sometimes he would be without headache for 5 days and then headaches would recur for several days.  He hasn't had a headache in 2 weeks.  He tried eletriptan, which aborts the headache quickly.  Prednisone taper was ineffective.    Current NSAIDS/analgesics:  none Current triptans:  eletriptan 20mg  Current ergotamine:  none Current anti-emetic:  none Current muscle relaxants:  tizanidine 4mg  PRN Current Antihypertensive medications:  Hyzaar Current Antidepressant medications:  none Current Anticonvulsant medications:  none Current anti-CGRP:  none Current Vitamins/Herbal/Supplements:  none Current Antihistamines/Decongestants:  Claritin, Astelin NS Other therapy:  none Hormone/birth control:  none   Past NSAIDS:  no Past analgesics:  no Past abortive triptans:  no Past muscle relaxants:  no Past anti-emetic:  no Past antihypertensive medications:  Prinzide, amlodipine, HCTZ Past antidepressant medications:  no Past anticonvulsant medications:  no Past vitamins/Herbal/Supplements:  no Other past therapies:  no     Caffeine:  no Alcohol:  3 to 5 drinks a week Smoker:  no Diet:  60 oz water Exercise:  yes Depression/stress:  no No personal history  of headache. Denies head injury prior to onset of symptoms.     PAST MEDICAL HISTORY: Past Medical History:  Diagnosis Date   Anosmia    Hypertension    Obesity (BMI 30-39.9)    Vitreous hemorrhage (HCC) 09/2018   R    PAST SURGICAL HISTORY: Past Surgical History:  Procedure Laterality Date   NECK  SURGERY  ~ 2000   mass removed anteriorly, Dr Cloria SpringWoliki   VASECTOMY  ~2000    MEDICATIONS: Current Outpatient Medications on File Prior to Visit  Medication Sig Dispense Refill   losartan-hydrochlorothiazide (HYZAAR) 100-25 MG tablet TAKE 1 TABLET DAILY 90 tablet 1   albuterol (VENTOLIN HFA) 108 (90 Base) MCG/ACT inhaler Inhale 2 puffs into the lungs every 6 (six) hours as needed for wheezing or shortness of breath. 18 g 5   atorvastatin (LIPITOR) 20 MG tablet Take 1 tablet (20 mg total) by mouth daily. 90 tablet 0   azelastine (ASTELIN) 0.1 % nasal spray Place 2 sprays into both nostrils 2 (two) times daily. 30 mL 1   doxycycline (VIBRAMYCIN) 100 MG capsule Take 1 capsule (100 mg total) by mouth 2 (two) times daily. 20 capsule 0   eletriptan (RELPAX) 20 MG tablet Take 1 tablet (20 mg total) by mouth as needed for migraine or headache. May repeat in 2 hours if headache persists or recurs.  No more than 2 tablets in a 24-hour period 20 tablet 0   loratadine (CLARITIN) 10 MG tablet Take 10 mg by mouth daily.     predniSONE (DELTASONE) 10 MG tablet Day 1: 2 tablets before breakfast, 1 after both lunch & dinner and 2 at bedtime; Day 2: 1 tab before breakfast, 1 after both lunch & dinner and 2 at bedtime; Day 3: 1 tab at each meal & 1 at bedtime; Day 4: 1 tab at breakfast, 1 at lunch, 1 at bedtime; Day 5: 1 tab at breakfast & 1 tab at bedtime; Day 6: 1 tab at breakfast 21 tablet 0   promethazine-dextromethorphan (PROMETHAZINE-DM) 6.25-15 MG/5ML syrup Take 5 mLs by mouth 4 (four) times daily as needed for cough. 118 mL 0   tiZANidine (ZANAFLEX) 4 MG capsule 1 tablet during the daytime if needed.  1 tablet at bedtime every night 45 capsule 0   No current facility-administered medications on file prior to visit.    ALLERGIES: No Known Allergies  FAMILY HISTORY: Family History  Problem Relation Age of Onset   Breast cancer Mother        lumpectomy, XRT, chemo   Diabetes Mother    Hypertension  Mother    Prostate cancer Father [id number]       ~ 60 y/o   Heart disease Father [id number]       MI    Colon cancer Neg Hx     Objective:  Blood pressure (!) 147/97, pulse 83, height 5\' 8"  (1.727 m), weight 255 lb 12.8 oz (116 kg), SpO2 96 %. General: No acute distress.  Patient appears well-groomed.   Head:  Normocephalic/atraumatic Eyes:  fundi examined but not visualized Neck: supple, no paraspinal tenderness, full range of motion Back: No paraspinal tenderness Heart: regular rate and rhythm Lungs: Clear to auscultation bilaterally. Vascular: No carotid bruits. Neurological Exam: Mental status: alert and oriented to person, place, and time, recent and remote memory intact, fund of knowledge intact, attention and concentration intact, speech fluent and not dysarthric, language intact. Cranial nerves: CN I: not tested CN II: pupils equal, round and reactive  to light, visual fields intact CN III, IV, VI:  full range of motion, no nystagmus, no ptosis CN V: facial sensation intact. CN VII: upper and lower face symmetric CN VIII: hearing intact CN IX, X: gag intact, uvula midline CN XI: sternocleidomastoid and trapezius muscles intact CN XII: tongue midline Bulk & Tone: normal, no fasciculations. Motor:  muscle strength 5/5 throughout Sensation:  Pinprick, temperature and vibratory sensation intact. Deep Tendon Reflexes:  2+ throughout,  toes downgoing.   Finger to nose testing:  Without dysmetria.   Heel to shin:  Without dysmetria.   Gait:  Normal station and stride.  Romberg negative.    Thank you for allowing me to take part in the care of this patient.  Shon Millet, DO  CC: Willow Ora, MD

## 2021-11-23 ENCOUNTER — Encounter: Payer: Self-pay | Admitting: Neurology

## 2021-11-23 ENCOUNTER — Ambulatory Visit (INDEPENDENT_AMBULATORY_CARE_PROVIDER_SITE_OTHER): Payer: BC Managed Care – PPO | Admitting: Neurology

## 2021-11-23 ENCOUNTER — Other Ambulatory Visit: Payer: Self-pay

## 2021-11-23 VITALS — BP 147/97 | HR 83 | Ht 68.0 in | Wt 255.8 lb

## 2021-11-23 DIAGNOSIS — G43809 Other migraine, not intractable, without status migrainosus: Secondary | ICD-10-CM

## 2021-11-23 MED ORDER — GABAPENTIN 100 MG PO CAPS: ORAL_CAPSULE | ORAL | 5 refills | Status: DC

## 2021-11-23 NOTE — Patient Instructions (Signed)
Start gabapentin 100mg  - take 1 pill at bedtime for one week, then increase to 2 pills at bedtime.  If you start having increased frequency of headaches, contact me and we can increase dose further and order MRI of cervical spine If the pain becomes more persistent/daily, we can consider occipital nerve block Follow up 4 months.

## 2021-12-27 ENCOUNTER — Ambulatory Visit: Payer: BC Managed Care – PPO | Admitting: Neurology

## 2021-12-28 ENCOUNTER — Emergency Department (HOSPITAL_BASED_OUTPATIENT_CLINIC_OR_DEPARTMENT_OTHER): Payer: BC Managed Care – PPO

## 2021-12-28 ENCOUNTER — Other Ambulatory Visit: Payer: Self-pay

## 2021-12-28 ENCOUNTER — Encounter (HOSPITAL_BASED_OUTPATIENT_CLINIC_OR_DEPARTMENT_OTHER): Payer: Self-pay | Admitting: Emergency Medicine

## 2021-12-28 ENCOUNTER — Observation Stay (HOSPITAL_BASED_OUTPATIENT_CLINIC_OR_DEPARTMENT_OTHER)
Admission: EM | Admit: 2021-12-28 | Discharge: 2021-12-29 | Disposition: A | Discharge status: Home / Self Care | Payer: BC Managed Care – PPO | Attending: Cardiovascular Disease | Admitting: Cardiovascular Disease

## 2021-12-28 DIAGNOSIS — R072 Precordial pain: Secondary | ICD-10-CM | POA: Diagnosis present

## 2021-12-28 DIAGNOSIS — R918 Other nonspecific abnormal finding of lung field: Secondary | ICD-10-CM

## 2021-12-28 DIAGNOSIS — Z8249 Family history of ischemic heart disease and other diseases of the circulatory system: Secondary | ICD-10-CM | POA: Insufficient documentation

## 2021-12-28 DIAGNOSIS — I248 Other forms of acute ischemic heart disease: Secondary | ICD-10-CM

## 2021-12-28 DIAGNOSIS — I471 Supraventricular tachycardia: Principal | ICD-10-CM | POA: Insufficient documentation

## 2021-12-28 DIAGNOSIS — R000 Tachycardia, unspecified: Secondary | ICD-10-CM | POA: Diagnosis not present

## 2021-12-28 DIAGNOSIS — Z20822 Contact with and (suspected) exposure to covid-19: Secondary | ICD-10-CM | POA: Insufficient documentation

## 2021-12-28 DIAGNOSIS — R778 Other specified abnormalities of plasma proteins: Secondary | ICD-10-CM | POA: Insufficient documentation

## 2021-12-28 DIAGNOSIS — I10 Essential (primary) hypertension: Secondary | ICD-10-CM | POA: Diagnosis not present

## 2021-12-28 DIAGNOSIS — R911 Solitary pulmonary nodule: Secondary | ICD-10-CM | POA: Insufficient documentation

## 2021-12-28 DIAGNOSIS — R0789 Other chest pain: Secondary | ICD-10-CM | POA: Diagnosis not present

## 2021-12-28 DIAGNOSIS — Z79899 Other long term (current) drug therapy: Secondary | ICD-10-CM | POA: Diagnosis not present

## 2021-12-28 DIAGNOSIS — N179 Acute kidney failure, unspecified: Secondary | ICD-10-CM | POA: Insufficient documentation

## 2021-12-28 DIAGNOSIS — Z87891 Personal history of nicotine dependence: Secondary | ICD-10-CM | POA: Diagnosis not present

## 2021-12-28 DIAGNOSIS — E785 Hyperlipidemia, unspecified: Secondary | ICD-10-CM

## 2021-12-28 DIAGNOSIS — R079 Chest pain, unspecified: Secondary | ICD-10-CM | POA: Diagnosis not present

## 2021-12-28 LAB — CBC WITH DIFFERENTIAL/PLATELET
Abs Immature Granulocytes: 0.02 10*3/uL (ref 0.00–0.07)
Basophils Absolute: 0.1 10*3/uL (ref 0.0–0.1)
Basophils Relative: 1 %
Eosinophils Absolute: 0.2 10*3/uL (ref 0.0–0.5)
Eosinophils Relative: 3 %
HCT: 43.7 % (ref 39.0–52.0)
Hemoglobin: 15.6 g/dL (ref 13.0–17.0)
Immature Granulocytes: 0 %
Lymphocytes Relative: 40 %
Lymphs Abs: 3.3 10*3/uL (ref 0.7–4.0)
MCH: 31.5 pg (ref 26.0–34.0)
MCHC: 35.7 g/dL (ref 30.0–36.0)
MCV: 88.1 fL (ref 80.0–100.0)
Monocytes Absolute: 0.7 10*3/uL (ref 0.1–1.0)
Monocytes Relative: 9 %
Neutro Abs: 3.8 10*3/uL (ref 1.7–7.7)
Neutrophils Relative %: 47 %
Platelets: 295 10*3/uL (ref 150–400)
RBC: 4.96 MIL/uL (ref 4.22–5.81)
RDW: 12 % (ref 11.5–15.5)
WBC: 8.2 10*3/uL (ref 4.0–10.5)
nRBC: 0 % (ref 0.0–0.2)

## 2021-12-28 LAB — COMPREHENSIVE METABOLIC PANEL
ALT: 34 U/L (ref 0–44)
AST: 37 U/L (ref 15–41)
Albumin: 3.6 g/dL (ref 3.5–5.0)
Alkaline Phosphatase: 46 U/L (ref 38–126)
Anion gap: 13 (ref 5–15)
BUN: 13 mg/dL (ref 6–20)
CO2: 23 mmol/L (ref 22–32)
Calcium: 8.6 mg/dL — ABNORMAL LOW (ref 8.9–10.3)
Chloride: 101 mmol/L (ref 98–111)
Creatinine, Ser: 1.37 mg/dL — ABNORMAL HIGH (ref 0.61–1.24)
GFR, Estimated: 59 mL/min — ABNORMAL LOW (ref >60)
Glucose, Bld: 112 mg/dL — ABNORMAL HIGH (ref 70–99)
Potassium: 3.2 mmol/L — ABNORMAL LOW (ref 3.5–5.1)
Sodium: 137 mmol/L (ref 135–145)
Total Bilirubin: 1 mg/dL (ref 0.3–1.2)
Total Protein: 6.8 g/dL (ref 6.5–8.1)

## 2021-12-28 LAB — TROPONIN I (HIGH SENSITIVITY)
Troponin I (High Sensitivity): 10 ng/L (ref <18)
Troponin I (High Sensitivity): 175 ng/L (ref <18)

## 2021-12-28 LAB — RESP PANEL BY RT-PCR (FLU A&B, COVID) ARPGX2
Influenza A by PCR: NEGATIVE
Influenza B by PCR: NEGATIVE
SARS Coronavirus 2 by RT PCR: NEGATIVE

## 2021-12-28 LAB — TSH: TSH: 3.132 u[IU]/mL (ref 0.350–4.500)

## 2021-12-28 LAB — MAGNESIUM: Magnesium: 1.7 mg/dL (ref 1.7–2.4)

## 2021-12-28 MED ORDER — HEPARIN (PORCINE) 25000 UT/250ML-% IV SOLN
1650.0000 [IU]/h | INTRAVENOUS | Status: DC
  Administered 2021-12-28: 1200 [IU]/h via INTRAVENOUS
  Administered 2021-12-29 (×2): 1400 [IU]/h via INTRAVENOUS
  Filled 2021-12-28 (×2): qty 250

## 2021-12-28 MED ORDER — ETOMIDATE 2 MG/ML IV SOLN: 10.0000 mg | Freq: Once | INTRAVENOUS | Status: AC

## 2021-12-28 MED ORDER — ETOMIDATE 2 MG/ML IV SOLN
INTRAVENOUS | Status: AC
  Administered 2021-12-28: 10 mg via INTRAVENOUS
  Filled 2021-12-28: qty 10

## 2021-12-28 MED ORDER — HEPARIN BOLUS VIA INFUSION
4000.0000 [IU] | Freq: Once | INTRAVENOUS | Status: AC
  Administered 2021-12-28: 4000 [IU] via INTRAVENOUS

## 2021-12-28 MED ORDER — ASPIRIN 81 MG PO CHEW
324.0000 mg | CHEWABLE_TABLET | Freq: Once | ORAL | Status: AC
  Administered 2021-12-28: 324 mg via ORAL
  Filled 2021-12-28: qty 4

## 2021-12-28 MED ORDER — SODIUM CHLORIDE 0.9 % IV BOLUS
1000.0000 mL | Freq: Once | INTRAVENOUS | Status: AC
  Administered 2021-12-28: 1000 mL via INTRAVENOUS

## 2021-12-28 NOTE — Progress Notes (Signed)
ANTICOAGULATION CONSULT NOTE - Initial Consult ? ?Pharmacy Consult for heparin ?Indication: chest pain/ACS ? ?No Known Allergies ? ?Patient Measurements: ?Height: 5\' 8"  (172.7 cm) ?Weight: 113.3 kg (249 lb 12.8 oz) ?IBW/kg (Calculated) : 68.4 ?Heparin Dosing Weight: 93.8kg ? ?Vital Signs: ?Temp: 97.5 ?F (36.4 ?C) (03/02 2005) ?Temp Source: Oral (03/02 2005) ?BP: 119/89 (03/02 2030) ?Pulse Rate: 96 (03/02 2030) ? ?Labs: ?No results for input(s): HGB, HCT, PLT, APTT, LABPROT, INR, HEPARINUNFRC, HEPRLOWMOCWT, CREATININE, CKTOTAL, CKMB, TROPONINIHS in the last 72 hours. ? ?CrCl cannot be calculated (Patient's most recent lab result is older than the maximum 21 days allowed.). ? ? ?Medical History: ?Past Medical History:  ?Diagnosis Date  ? Anosmia   ? Hypertension   ? Obesity (BMI 30-39.9)   ? Vitreous hemorrhage (HCC) 09/2018  ? R  ? ? ?Assessment: ?23 YOM presenting with CP, tightness, he is not on anticoagulation PTA ? ?Goal of Therapy:  ?Heparin level 0.3-0.7 units/ml ?Monitor platelets by anticoagulation protocol: Yes ?  ?Plan:  ?Heparin 4000 units IV x 1, and gtt 1200 units/hr ?F/u 6 hour heparin level ? ?67, PharmD ?Clinical Pharmacist ?ED Pharmacist Phone # [phone] ?12/28/2021 8:41 PM ? ? ? ?

## 2021-12-28 NOTE — ED Notes (Signed)
Patient provided with a snack per MD request.  Patient updated on plan of care.  Wife remains at bedside.  Patient reports no pain at this time.  Sinus Rhythm noted on monitor ?

## 2021-12-28 NOTE — ED Notes (Signed)
Repeat EKG preformed per order ?

## 2021-12-28 NOTE — ED Provider Notes (Signed)
Emergency Department Provider Note   I have reviewed the triage vital signs and the nursing notes.   HISTORY  Chief Complaint Chest Pain   HPI Christian Ramos is a 61 y.o. male with PMH of HTN and elevated BMI presents to the ED with acute onset CP and fatigue/near syncope. Patient was walking when symptoms began.  He went home but his discomfort did not resolve.  He felt a severe heaviness in his chest and ultimately just had to present to the emergency department.  At the time of arrival he had around 1 hour of pain. No SOB. No fever. No history of ACS.    Past Medical History:  Diagnosis Date   Anosmia    Hypertension    Obesity (BMI 30-39.9)    Vitreous hemorrhage ([facility]) 09/2018   R    Review of Systems  Constitutional: No fever/chills Eyes: No visual changes. ENT: No sore throat. Cardiovascular: Positive chest pain and near syncope.  Respiratory: Denies shortness of breath. Gastrointestinal: No abdominal pain.  No nausea, no vomiting.  No diarrhea.  No constipation. Genitourinary: Negative for dysuria. Musculoskeletal: Negative for back pain. Skin: Negative for rash. Neurological: Negative for headaches.   ____________________________________________   PHYSICAL EXAM:  VITAL SIGNS: ED Triage Vitals  Enc Vitals Group     BP 12/28/21 2005 102/80     Pulse Rate 12/28/21 2005 (!) 224     Resp 12/28/21 2005 18     Temp 12/28/21 2005 (!) 97.5 F (36.4 C)     Temp Source 12/28/21 2005 Oral     SpO2 12/28/21 2005 98 %     Weight 12/28/21 2002 249 lb 12.8 oz (113.3 kg)     Height 12/28/21 2002 5\' 8"  (1.727 m)   Constitutional: Alert and oriented. Appears ill with mild diaphoresis.  Eyes: Conjunctivae are normal.  Head: Atraumatic. Nose: No congestion/rhinnorhea. Mouth/Throat: Mucous membranes are moist.  Neck: No stridor.   Cardiovascular: Severe tachycardia. Good peripheral circulation. Grossly normal heart sounds.   Respiratory: Normal respiratory  effort.  No retractions. Lungs CTAB. Gastrointestinal: Soft and nontender. No distention.  Musculoskeletal: No gross deformities of extremities. Neurologic:  Normal speech and language.  Skin:  Skin is warm, dry and intact. No rash noted.   ____________________________________________   LABS (all labs ordered are listed, but only abnormal results are displayed)  Labs Reviewed  COMPREHENSIVE METABOLIC PANEL - Abnormal; Notable for the following components:      Result Value   Potassium 3.2 (*)    Glucose, Bld 112 (*)    Creatinine, Ser 1.37 (*)    Calcium 8.6 (*)    GFR, Estimated 59 (*)    All other components within normal limits  HEPARIN LEVEL (UNFRACTIONATED) - Abnormal; Notable for the following components:   Heparin Unfractionated 0.21 (*)    All other components within normal limits  CBC - Abnormal; Notable for the following components:   HCT 38.7 (*)    MCHC 36.2 (*)    All other components within normal limits  HEPARIN LEVEL (UNFRACTIONATED) - Abnormal; Notable for the following components:   Heparin Unfractionated 0.22 (*)    All other components within normal limits  TROPONIN I (HIGH SENSITIVITY) - Abnormal; Notable for the following components:   Troponin I (High Sensitivity) 175 (*)    All other components within normal limits  RESP PANEL BY RT-PCR (FLU A&B, COVID) ARPGX2  CBC WITH DIFFERENTIAL/PLATELET  MAGNESIUM  TSH  CBC  CREATININE, SERUM  TROPONIN I (HIGH SENSITIVITY)   ____________________________________________  EKG   EKG Interpretation  Date/Time:  Thursday December 28 2021 21:36:14 EST Ventricular Rate:  81 PR Interval:  168 QRS Duration: 95 QT Interval:  406 QTC Calculation: 472 R Axis:   20 Text Interpretation: Sinus rhythm Borderline T abnormalities, inferior leads Confirmed by Thamas Jaegers ([id number]) on 01/03/2022 9:36:23 AM        ____________________________________________  RADIOLOGY  DG Chest Portable 1 View  Result Date:  12/28/2021 CLINICAL DATA:  Chest pain and tightness EXAM: PORTABLE CHEST 1 VIEW COMPARISON:  None. FINDINGS: The heart size and mediastinal contours are within normal limits. Both lungs are clear. The visualized skeletal structures are unremarkable. IMPRESSION: No active disease. Electronically Signed   By: Ulyses Jarred M.D.   On: 12/28/2021 20:40    ____________________________________________   PROCEDURES  Procedure(s) performed:   .Cardioversion  Date/Time: 12/28/2021 9:44 PM Performed by: Margette Fast, MD Authorized by: Margette Fast, MD   Consent:    Consent obtained:  Emergent situation   Consent given by:  Patient   Risks discussed:  Cutaneous burn, death, induced arrhythmia and pain Universal protocol:    Immediately prior to procedure a time out was called: yes     Patient identity confirmed:  Verbally with patient Pre-procedure details:    Cardioversion basis:  Emergent   Rhythm:  Supraventricular tachycardia   Electrode placement:  Anterior-posterior Patient sedated: Yes. Refer to sedation procedure documentation for details of sedation.  Attempt one:    Cardioversion mode:  Synchronous   Waveform:  Biphasic   Shock (Joules):  100   Shock outcome:  Conversion to normal sinus rhythm Post-procedure details:    Patient status:  Awake   Patient tolerance of procedure:  Tolerated well, no immediate complications .Sedation  Date/Time: 12/28/2021 9:46 PM Performed by: Margette Fast, MD Authorized by: Margette Fast, MD   Consent:    Consent obtained:  Emergent situation   Consent given by:  Patient   Risks discussed:  Allergic reaction, dysrhythmia, inadequate sedation, nausea, vomiting, respiratory compromise necessitating ventilatory assistance and intubation, prolonged hypoxia resulting in organ damage and prolonged sedation necessitating reversal   Alternatives discussed:  Analgesia without sedation Universal protocol:    Immediately prior to procedure, a time  out was called: yes     Patient identity confirmed:  Verbally with patient Indications:    Procedure performed:  Cardioversion Pre-sedation assessment:    Time since last food or drink:  6 hours   ASA classification: class 2 - patient with mild systemic disease     Mallampati score:  II - soft palate, uvula, fauces visible   Neck mobility: normal     Pre-sedation assessments completed and reviewed: airway patency, cardiovascular function, hydration status, mental status, nausea/vomiting, pain level, respiratory function and temperature   Immediate pre-procedure details:    Reassessment: Patient reassessed immediately prior to procedure     Reviewed: vital signs, relevant labs/tests and NPO status     Verified: bag valve mask available, emergency equipment available, intubation equipment available, IV patency confirmed, oxygen available and suction available   Procedure details (see MAR for exact dosages):    Preoxygenation:  Nasal cannula   Sedation:  Etomidate   Intended level of sedation: deep   Intra-procedure monitoring:  Blood pressure monitoring, cardiac monitor, continuous pulse oximetry, frequent LOC assessments and frequent vital sign checks   Intra-procedure events: none     Total Provider sedation time (minutes):  25 Post-procedure details:    Attendance: Constant attendance by certified staff until patient recovered     Recovery: Patient returned to pre-procedure baseline     Post-sedation assessments completed and reviewed: airway patency, cardiovascular function, hydration status, mental status, nausea/vomiting, pain level, respiratory function and temperature     Patient is stable for discharge or admission: yes     Procedure completion:  Tolerated well, no immediate complications .Critical Care Performed by: Margette Fast, MD Authorized by: Margette Fast, MD   Critical care provider statement:    Critical care time (minutes):  30   Critical care time was exclusive  of:  Separately billable procedures and treating other patients and teaching time   Critical care was necessary to treat or prevent imminent or life-threatening deterioration of the following conditions:  Circulatory failure   Critical care was time spent personally by me on the following activities:  Development of treatment plan with patient or surrogate, discussions with consultants, evaluation of patient's response to treatment, examination of patient, ordering and review of laboratory studies, ordering and review of radiographic studies, ordering and performing treatments and interventions, pulse oximetry, re-evaluation of patient's condition and review of old charts   I assumed direction of critical care for this patient from another provider in my specialty: no     Care discussed with: admitting provider     ____________________________________________   INITIAL IMPRESSION / [place] / ED COURSE  Pertinent labs & imaging results that were available during my care of the patient were reviewed by me and considered in my medical decision making (see chart for details).   This patient is Presenting for Evaluation of CP, which does require a range of treatment options, and is a complaint that involves a high risk of morbidity and mortality.  The Differential Diagnoses includes all life-threatening causes for chest pain. This includes but is not exclusive to acute coronary syndrome, aortic dissection, pulmonary embolism, cardiac tamponade, community-acquired pneumonia, pericarditis, musculoskeletal chest wall pain, etc.   Critical Interventions- emergent electrical cardioversion   Medications  etomidate (AMIDATE) injection 10 mg (10 mg Intravenous Given 12/28/21 2016)  sodium chloride 0.9 % bolus 1,000 mL (0 mLs Intravenous Stopped 12/28/21 2151)  aspirin chewable tablet 324 mg (324 mg Oral Given 12/28/21 2055)  heparin bolus via infusion 4,000 Units (4,000 Units Intravenous Bolus from  Bag 12/28/21 2059)  potassium chloride SA (KLOR-CON M) CR tablet 40 mEq (40 mEq Oral Given 12/29/21 0925)  metoprolol tartrate (LOPRESSOR) tablet 50 mg (50 mg Oral Given 12/29/21 1506)  iohexol (OMNIPAQUE) 350 MG/ML injection 95 mL (95 mLs Intravenous Contrast Given 12/29/21 1539)    Reassessment after intervention: symptoms improved. No complications.    I did obtain Additional Historical Information from spouse at bedside.  I decided to review pertinent External Data, and in summary no prior EKGs in our system.   Clinical Laboratory Tests Ordered, included initial troponin is negative.  COVID and flu are negative.  Electrolytes not significantly abnormal.  Patient's creatinine is 1.37.  No anemia or leukocytosis. Troponin negative x 1 but will trend.   Radiologic Tests Ordered, included CXR. I independently interpreted the images and agree with radiology interpretation.   Cardiac Monitor Tracing which shows NSR   Social Determinants of Health Risk patient is a non-smoker  Consult complete with Cardiology  08:30 AM Dr. Tamala Julian, Cardiology. No STEMI activation but appears ischemic. Plan for heparin, ASA, and likely admit.   Spoke with Cardiology fellow on  call. Plan to admit to their service. Orders placed at his request.   Medical Decision Making: Summary:  Patient presents to the emergency department for evaluation of acute onset chest pain.  He arrives with what appears to be SVT although the ST segment is somewhat wider that I would be comfortable with giving adenosine with no prior EKGs to compare.  Discussed with patient to move forward with emergent electrical cardioversion.  This was performed without complication.  He returned to normal sinus rhythm and symptoms greatly improved.  No residual chest pain.  His EKG immediately after cardioversion showed some diffuse ST depressions especially prominent in V4 through V6 with isolated ST elevation in aVR.  Discussed this with Dr. Tamala Julian as  above.   09:45 PM Patient continues to do well.  His heart rate remains within normal limits. 3rd EKG performed showing resolution of ST depressions and elevation in aVR although does have some continued T wave inversions inferior and 3 and aVF.  Reevaluation with update and discussion with patient and family. Remains in NSR. Agrees with plan for admit and Cardiology evaluation.   Disposition: admit  ____________________________________________  FINAL CLINICAL IMPRESSION(S) / ED DIAGNOSES  Final diagnoses:  Elevated troponin I level  Tachycardia     NEW OUTPATIENT MEDICATIONS STARTED DURING THIS VISIT:  Discharge Medication List as of 12/29/2021  7:46 PM     START taking these medications   Details  metoprolol succinate (TOPROL-XL) 50 MG 24 hr tablet Take 1 tablet (50 mg total) by mouth daily., Starting Sat 12/30/2021, Normal        Note:  This document was prepared using Dragon voice recognition software and may include unintentional dictation errors.  Nanda Quinton, MD, [facility]    Jamina Macbeth, [facility], MD 01/05/22 [phone]

## 2021-12-28 NOTE — ED Triage Notes (Signed)
CP, tightness, light headed.started 1 hour ago during a 2 mile walk.  ?

## 2021-12-28 NOTE — ED Notes (Signed)
Patient updated on plan of care

## 2021-12-28 NOTE — ED Notes (Signed)
Patient cardioverted at 100 J by MD.  Returned of Sinus Rhythm noted on monitor.  ?

## 2021-12-29 ENCOUNTER — Encounter: Payer: Self-pay | Admitting: Internal Medicine

## 2021-12-29 ENCOUNTER — Observation Stay (HOSPITAL_COMMUNITY): Payer: BC Managed Care – PPO

## 2021-12-29 DIAGNOSIS — Z8249 Family history of ischemic heart disease and other diseases of the circulatory system: Secondary | ICD-10-CM | POA: Diagnosis not present

## 2021-12-29 DIAGNOSIS — R0789 Other chest pain: Secondary | ICD-10-CM | POA: Diagnosis not present

## 2021-12-29 DIAGNOSIS — R778 Other specified abnormalities of plasma proteins: Secondary | ICD-10-CM | POA: Diagnosis not present

## 2021-12-29 DIAGNOSIS — R918 Other nonspecific abnormal finding of lung field: Secondary | ICD-10-CM

## 2021-12-29 DIAGNOSIS — N179 Acute kidney failure, unspecified: Secondary | ICD-10-CM | POA: Diagnosis not present

## 2021-12-29 DIAGNOSIS — E785 Hyperlipidemia, unspecified: Secondary | ICD-10-CM

## 2021-12-29 DIAGNOSIS — I248 Other forms of acute ischemic heart disease: Secondary | ICD-10-CM

## 2021-12-29 DIAGNOSIS — I471 Supraventricular tachycardia, unspecified: Secondary | ICD-10-CM | POA: Diagnosis present

## 2021-12-29 DIAGNOSIS — Z79899 Other long term (current) drug therapy: Secondary | ICD-10-CM | POA: Diagnosis not present

## 2021-12-29 DIAGNOSIS — I10 Essential (primary) hypertension: Secondary | ICD-10-CM | POA: Diagnosis not present

## 2021-12-29 DIAGNOSIS — R072 Precordial pain: Secondary | ICD-10-CM | POA: Diagnosis not present

## 2021-12-29 DIAGNOSIS — Z87891 Personal history of nicotine dependence: Secondary | ICD-10-CM | POA: Diagnosis not present

## 2021-12-29 DIAGNOSIS — Z20822 Contact with and (suspected) exposure to covid-19: Secondary | ICD-10-CM | POA: Diagnosis not present

## 2021-12-29 DIAGNOSIS — R911 Solitary pulmonary nodule: Secondary | ICD-10-CM | POA: Diagnosis not present

## 2021-12-29 LAB — CBC
HCT: 38.7 % — ABNORMAL LOW (ref 39.0–52.0)
HCT: 41.9 % (ref 39.0–52.0)
Hemoglobin: 14 g/dL (ref 13.0–17.0)
Hemoglobin: 14.6 g/dL (ref 13.0–17.0)
MCH: 31.9 pg (ref 26.0–34.0)
MCH: 31.1 pg (ref 26.0–34.0)
MCHC: 36.2 g/dL — ABNORMAL HIGH (ref 30.0–36.0)
MCHC: 34.8 g/dL (ref 30.0–36.0)
MCV: 88.2 fL (ref 80.0–100.0)
MCV: 89.3 fL (ref 80.0–100.0)
Platelets: 277 10*3/uL (ref 150–400)
Platelets: 293 10*3/uL (ref 150–400)
RBC: 4.39 MIL/uL (ref 4.22–5.81)
RBC: 4.69 MIL/uL (ref 4.22–5.81)
RDW: 12 % (ref 11.5–15.5)
RDW: 12.1 % (ref 11.5–15.5)
WBC: 7.3 10*3/uL (ref 4.0–10.5)
WBC: 5.6 10*3/uL (ref 4.0–10.5)
nRBC: 0 % (ref 0.0–0.2)
nRBC: 0 % (ref 0.0–0.2)

## 2021-12-29 LAB — CREATININE, SERUM
Creatinine, Ser: 1.1 mg/dL (ref 0.61–1.24)
GFR, Estimated: >60 mL/min (ref >60)

## 2021-12-29 LAB — HEPARIN LEVEL (UNFRACTIONATED)
Heparin Unfractionated: 0.21 IU/mL — ABNORMAL LOW (ref 0.30–0.70)
Heparin Unfractionated: 0.22 IU/mL — ABNORMAL LOW (ref 0.30–0.70)

## 2021-12-29 SURGERY — LEFT HEART CATH AND CORONARY ANGIOGRAPHY
Anesthesia: LOCAL

## 2021-12-29 MED ORDER — NITROGLYCERIN 0.4 MG SL SUBL: 0.4000 mg | SUBLINGUAL_TABLET | SUBLINGUAL | Status: DC | PRN

## 2021-12-29 MED ORDER — LORATADINE 10 MG PO TABS
10.0000 mg | ORAL_TABLET | Freq: Every day | ORAL | Status: DC
Start: 2021-12-30 — End: 2021-12-30

## 2021-12-29 MED ORDER — METOPROLOL SUCCINATE ER 50 MG PO TB24: 50.0000 mg | ORAL_TABLET | Freq: Every day | ORAL | 2 refills | Status: DC

## 2021-12-29 MED ORDER — POTASSIUM CHLORIDE CRYS ER 20 MEQ PO TBCR
40.0000 meq | EXTENDED_RELEASE_TABLET | Freq: Once | ORAL | Status: AC
Start: 2021-12-29 — End: 2021-12-29
  Administered 2021-12-29: 40 meq via ORAL
  Filled 2021-12-29: qty 2

## 2021-12-29 MED ORDER — ACETAMINOPHEN 325 MG PO TABS: 650.0000 mg | ORAL_TABLET | ORAL | Status: DC | PRN

## 2021-12-29 MED ORDER — ONDANSETRON HCL 4 MG/2ML IJ SOLN: 4.0000 mg | Freq: Four times a day (QID) | INTRAMUSCULAR | Status: DC | PRN

## 2021-12-29 MED ORDER — METOPROLOL TARTRATE 50 MG PO TABS
50.0000 mg | ORAL_TABLET | Freq: Once | ORAL | Status: AC
  Administered 2021-12-29: 50 mg via ORAL
  Filled 2021-12-29: qty 1

## 2021-12-29 MED ORDER — ENOXAPARIN SODIUM 40 MG/0.4ML IJ SOSY: 40.0000 mg | PREFILLED_SYRINGE | INTRAMUSCULAR | Status: DC

## 2021-12-29 MED ORDER — NITROGLYCERIN 0.4 MG SL SUBL
SUBLINGUAL_TABLET | SUBLINGUAL | Status: AC
  Filled 2021-12-29: qty 2

## 2021-12-29 MED ORDER — METOPROLOL SUCCINATE ER 25 MG PO TB24
25.0000 mg | ORAL_TABLET | Freq: Every day | ORAL | Status: DC
Start: 2021-12-30 — End: 2021-12-30

## 2021-12-29 MED ORDER — GABAPENTIN 100 MG PO CAPS: 100.0000 mg | ORAL_CAPSULE | Freq: Every day | ORAL | Status: DC

## 2021-12-29 MED ORDER — IOHEXOL 350 MG/ML SOLN
95.0000 mL | Freq: Once | INTRAVENOUS | Status: AC | PRN
  Administered 2021-12-29: 95 mL via INTRAVENOUS

## 2021-12-29 MED ORDER — METOPROLOL TARTRATE 5 MG/5ML IV SOLN
INTRAVENOUS | Status: AC
  Filled 2021-12-29: qty 5

## 2021-12-29 NOTE — Progress Notes (Signed)
ANTICOAGULATION CONSULT NOTE - Follow Up Consult ? ?Pharmacy Consult for heparin ?Indication: chest pain/ACS ? ?Labs: ?Recent Labs  ?  12/28/21 ?2024 12/28/21 ?2224 12/29/21 ?0500 12/29/21 ?0505  ?HGB 15.6  --   --  14.0  ?HCT 43.7  --   --  38.7*  ?PLT 295  --   --  277  ?HEPARINUNFRC  --   --  0.21*  --   ?CREATININE 1.37*  --   --   --   ?TROPONINIHS 10 175*  --   --   ? ? ?Assessment: ?61yo male subtherapeutic on heparin with initial dosing for CP; no infusion issues or signs of bleeding per RN. ? ?Goal of Therapy:  ?Heparin level 0.3-0.7 units/ml ?  ?Plan:  ?Will increase heparin infusion by 2 units/kgABW/hr to 1400 units/hr and check level in 6 hours.   ? ?Wynona Neat, PharmD, BCPS  ?12/29/2021,6:43 AM ? ? ?

## 2021-12-29 NOTE — Discharge Instructions (Signed)
Medication Changes: ?- START Metoprolol succinate (Toprol-XL) 50mg  daily. Start this on Saturday 12/30/2021. ?- Continue all other home medications as described elsewhere on discharge paperwork. ? ?Please monitor your blood pressure and heart rate closely over the next few days. If you notice your heart rate dropping below 55 bpm, cut the Toprol-XL tablet in half and only take 25mg  daily. If you notice your blood pressure dropping too low or you start having symptoms of low blood pressure (lightheadedness, dizziness, feeling like you are going to pass out, etc.), please let us know. ? ?

## 2021-12-29 NOTE — ED Notes (Signed)
Patient resting quietly in bed with eyes closed.  Respirations even and unlabored. °

## 2021-12-29 NOTE — ED Notes (Signed)
Patient resting quietly.  Reports no current c/o.  Updated on plan of care ?

## 2021-12-29 NOTE — Progress Notes (Signed)
ANTICOAGULATION CONSULT NOTE - Follow Up Consult ? ?Pharmacy Consult for Heparin ?Indication: chest pain/ACS ? ?No Known Allergies ? ?Patient Measurements: ?Height: 5\' 8"  (172.7 cm) ?Weight: 116.1 kg (255 lb 15.3 oz) ?IBW/kg (Calculated) : 68.4 ?Heparin Dosing Weight:  94.7 kg ? ?Vital Signs: ?Temp: 97.9 ?F (36.6 ?C) (03/03 1232) ?Temp Source: Oral (03/03 1232) ?BP: 134/102 (03/03 1232) ?Pulse Rate: 76 (03/03 1232) ? ?Labs: ?Recent Labs  ?  12/28/21 ?2024 12/28/21 ?2224 12/29/21 ?0500 12/29/21 ?0505 12/29/21 ?1305  ?HGB 15.6  --   --  14.0  --   ?HCT 43.7  --   --  38.7*  --   ?PLT 295  --   --  277  --   ?HEPARINUNFRC  --   --  0.21*  --  0.22*  ?CREATININE 1.37*  --   --   --   --   ?TROPONINIHS 10 175*  --   --   --   ? ? ?Estimated Creatinine Clearance: 71 mL/min (A) (by C-G formula based on SCr of 1.37 mg/dL (H)). ? ? ?Assessment: ?Anticoag: Heparin for CP - CBC WNL, no bleeding noted, no AC PTA, trop elevated. CBC WNL ?- Hep level 0.21, and 0.22 no change despite AM rate increase. ? ?Goal of Therapy:  ?Heparin level 0.3-0.7 units/ml ?Monitor platelets by anticoagulation protocol: Yes ?  ?Plan:  ?Increase IV heparin to 1650 units/hr ?Recheck in 6 hrs. ?Daily HL and CBC ? ? ?Reiko Vinje S. Alford Highland, PharmD, BCPS ?Clinical Staff Pharmacist [email or web address] ? ?Alford Highland, [employer] ?12/29/2021,1:43 PM ? ? ?

## 2021-12-29 NOTE — TOC Progression Note (Signed)
Transition of Care (TOC) - Progression Note  ? ? ?Patient Details  ?Name: NAYEL PURDY ?MRN: [id number] ?Date of Birth: 01/31/1961 ? ?Transition of Care (TOC) CM/SW Contact  ?Leone Haven, RN ?Phone Number: ?12/29/2021, 2:39 PM ? ?Clinical Narrative:    ? ?Transition of Care (TOC) Screening Note ? ? ?Patient Details  ?Name: ROCKO FESPERMAN ?Date of Birth: 1961-08-01 ? ? ?Transition of Care (TOC) CM/SW Contact:    ?Leone Haven, RN ?Phone Number: ?12/29/2021, 2:39 PM ? ? ? ?Transition of Care Department [facility]) has reviewed patient and no TOC needs have been identified at this time. We will continue to monitor patient advancement through interdisciplinary progression rounds. If new patient transition needs arise, please place a TOC consult. ?  ? ? ?  ?  ? ?Expected Discharge Plan and Services ?  ?  ?  ?  ?  ?                ?  ?  ?  ?  ?  ?  ?  ?  ?  ?  ? ? ?Social Determinants of Health (SDOH) Interventions ?  ? ?Readmission Risk Interventions ?No flowsheet data found. ? ?

## 2021-12-29 NOTE — ED Notes (Signed)
Leaving with [facility] at this time. 

## 2021-12-29 NOTE — H&P (Addendum)
Cardiology Admission History and Physical:   Patient ID: NALIN MAZZOCCO MRN: [id number]; DOB: [birth year]   Admission date: 12/28/2021  PCP:  Colon Branch, MD   [place] [facility] Cardiologist:  [facility] (Dr. Claiborne Billings)   Chief Complaint:   lightheadedness and chest tightness  Patient Profile:   Christian Ramos is a 61 y.o. male with hypertension, obesity, and migraines but no known prior cardiac history who is being admitted for SVT after presenting with lightheadedness and mild chest tightness   History of Present Illness:   Christian Ramos is a 61 year old male with a history of hypertension and obesity.  No cardiac history.  No prior cardiac work-up.  He does have a history of hypertension for which he takes Losartan-HCTZ.  He has Lipitor listed under PTA medications but states he is not taking this and does not have a history of hyperlipidemia.  He does have a family history of heart disease.  His father had known CAD and underwent CABG in his 51s.  His sister has SVT and ultimately required ablation.  He reports remote smoking history in college but none since then.  Presented to the [facility] ED on 12/28/2021 for further evaluation of chest pain. Upon arrival to the ED, EKG showed wide-complex tachycardia with rates in the 220s with ST elevation in aVR and diffuse ST depressions. This was felt to be SVT.  There was concern about giving adenosine with no prior EKGs for comparison.  Therefore, patient underwent emergent electrical cardioversion with restoration of sinus rhythm.  EKG following cardioversion showed sinus tachycardia, rate 111-minute, with mild ST elevation in aVR and continued diffuse ST depression but much improved.  However, ST depression completely resolved as heart rate slowed.  Initial high-sensitivity troponin negative at 10 but repeat 175 (after cardioversion).  Chest x-ray showed no acute findings. WBC 8.2, Hgb 15.6, Plts 295. Na 137, K 3.2, Glucose 112, BUN 13, Cr  1.37. Albumin 3.6, AST 37, ALT 34, Alk Phos 46, Total Bili 1.0. Magnesium 1.7. TSH normal. Respiratory panel negative for COVID and influenza A/B.  Patient was started on IV heparin and admitted to [facility].  At the time of this evaluation, patient is resting comfortably in no acute distress. He is maintaining sinus rhythm with rates in the 70s to 80s. He denies any symptoms since restoration of sinus rhythm. He states he was in his usual state of health until the evening of 12/28/2021.  He was walking 2 miles which she gets frequently and noticed worsening in that he was not feeling well.  He describes some lightheadedness and some mild shortness of breath as well as some vague chest tightness of his right upper chest near his clavicle. Symptoms lasted for about an hour before he decided to go to the ED for further evaluation. He denies any palpitations and was unaware of his rapid heart rate. No syncope.  He has never had this happen before.  No other chest pain.  No orthopnea, PND, lower extremity edema.  He denies any recent fevers or illnesses.  No cough, nasal congestion, nausea, vomiting, diarrhea.  No abnormal bleeding in urine or stools.  His wife states that he is a very loud snorer at night but she does not describe any apneic episodes.  He has never been tested for sleep apnea.  Past Medical History:  Diagnosis Date   Anosmia    Hypertension    Obesity (BMI 30-39.9)    Vitreous hemorrhage ([place]) 09/2018  R    Past Surgical History:  Procedure Laterality Date   NECK SURGERY  ~ 2000   mass removed anteriorly, Dr Warren Danes   VASECTOMY  ~2000     Medications Prior to Admission: Prior to Admission medications   Medication Sig Start Date End Date Taking? Authorizing Provider  albuterol (VENTOLIN HFA) 108 (90 Base) MCG/ACT inhaler Inhale 2 puffs into the lungs every 6 (six) hours as needed for wheezing or shortness of breath. Patient not taking: Reported on 11/23/2021 01/02/21   Colon Branch,  MD  atorvastatin (LIPITOR) 20 MG tablet Take 1 tablet (20 mg total) by mouth daily. Patient not taking: Reported on 11/23/2021 09/29/21   Colon Branch, MD  azelastine (ASTELIN) 0.1 % nasal spray Place 2 sprays into both nostrils 2 (two) times daily. Patient not taking: Reported on 11/23/2021 11/11/19   Colon Branch, MD  doxycycline (VIBRAMYCIN) 100 MG capsule Take 1 capsule (100 mg total) by mouth 2 (two) times daily. Patient not taking: Reported on 11/23/2021 10/15/21   Muthersbaugh, Jarrett Soho, PA-C  eletriptan (RELPAX) 20 MG tablet Take 1 tablet (20 mg total) by mouth as needed for migraine or headache. May repeat in 2 hours if headache persists or recurs.  No more than 2 tablets in a 24-hour period Patient not taking: Reported on 11/23/2021 11/06/21   Colon Branch, MD  gabapentin (NEURONTIN) 100 MG capsule Take 1 capsule at bedtime for one week, then increase to 2 capsules at bedtime 11/23/21   Pieter Partridge, DO  loratadine (CLARITIN) 10 MG tablet Take 10 mg by mouth daily. Patient not taking: Reported on 11/23/2021    [provider]  losartan-hydrochlorothiazide (HYZAAR) 100-25 MG tablet TAKE 1 TABLET DAILY 11/13/21   Colon Branch, MD  predniSONE (DELTASONE) 10 MG tablet Day 1: 2 tablets before breakfast, 1 after both lunch & dinner and 2 at bedtime; Day 2: 1 tab before breakfast, 1 after both lunch & dinner and 2 at bedtime; Day 3: 1 tab at each meal & 1 at bedtime; Day 4: 1 tab at breakfast, 1 at lunch, 1 at bedtime; Day 5: 1 tab at breakfast & 1 tab at bedtime; Day 6: 1 tab at breakfast Patient not taking: Reported on 11/23/2021 10/15/21   Muthersbaugh, Jarrett Soho, PA-C  promethazine-dextromethorphan (PROMETHAZINE-DM) 6.25-15 MG/5ML syrup Take 5 mLs by mouth 4 (four) times daily as needed for cough. Patient not taking: Reported on 11/23/2021 10/15/21   Muthersbaugh, Jarrett Soho, PA-C  tiZANidine (ZANAFLEX) 4 MG capsule 1 tablet during the daytime if needed.  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  Tobacco Use   Smoking status: Never   Smokeless tobacco: Never  Substance and Sexual Activity   Alcohol use: Yes    Alcohol/week: 0.0 standard drinks    Comment: Social   Drug use: No   Sexual activity: Not on file  Other Topics Concern   Not on file  Social History Narrative   2 daughters, one married lives in [place], other is  married,  lives in [place]        Social Determinants of Health   Financial Resource Strain: Not on file  Food Insecurity: Not on file  Transportation Needs: Not on file  Physical Activity: Not on file  Stress: Not on file  Social Connections: Not on file  Intimate Partner Violence: Not on file    Family History:   The patient's family history includes Breast cancer in his mother; Diabetes in his mother; Heart disease (age of onset: 38) in his father; Hypertension in his mother; Prostate cancer (age of onset: 71) in his father. There is no history of Colon cancer.    ROS:  Please see the history of present illness.  Review of Systems  Constitutional:  Negative for fever.  HENT:  Negative for congestion.   Respiratory:  Positive for shortness of breath. Negative for cough.   Cardiovascular:  Positive for chest pain. Negative for palpitations, orthopnea, leg swelling and PND.  Gastrointestinal:  Negative for blood in stool, diarrhea, melena, nausea and vomiting.  Genitourinary:  Negative for hematuria.  Musculoskeletal:  Negative for myalgias.  Neurological:  Positive for dizziness. Negative for loss of consciousness.  Endo/Heme/Allergies:  Does not bruise/bleed easily.   Psychiatric/Behavioral:  Negative for substance abuse.    Physical Exam/Data:   Vitals:   12/29/21 0700 12/29/21 0900 12/29/21 1123 12/29/21 1232  BP: 108/82 (!) 117/91  (!) 134/102  Pulse: 76 80  76  Resp:  17  20  Temp:   97.8 F (36.6 C) 97.9 F (36.6 C)  TempSrc:   Oral Oral  SpO2: 97% 96%  100%  Weight:    116.1 kg  Height:    '5\' 8"'  (1.727 m)    Intake/Output Summary (Last 24 hours) at 12/29/2021 1407 Last data filed at 12/29/2021 1313 Gross per 24 hour  Intake 1000 ml  Output 425 ml  Net 575 ml   Last 3 Weights 12/29/2021 12/28/2021 11/23/2021  Weight (lbs) 255 lb 15.3 oz 249 lb 12.8 oz 255 lb 12.8 oz  Weight (kg) 116.1 kg 113.309 kg 116.03 kg     Body mass index is 38.92 kg/m.  General: 61 y.o. obese Caucasian male resting comfortably in no acute distress. HEENT: Normocephalic and atraumatic. Sclera clear.  Neck: Supple. No carotid bruits. No JVD. Heart: RRR. Distinct S1 and S2. No murmurs, gallops, or rubs. Radial pulses 2+ and equal bilaterally. Lungs: No increased work of breathing. Clear to ausculation bilaterally. No wheezes, rhonchi, or rales.  Abdomen: Soft, non-distended, and non-tender to palpation. Bowel sounds present. Extremities: Trace edema of bilateral lower extremities.    Skin: Warm and dry. Neuro: Alert and oriented x3. No focal deficits. Psych: Normal affect. Responds appropriately.  EKG:  The ECGs that were done were personally reviewed: - Initial EKG on 12/28/2021 at 20:03 showed SVT, rate 223 bpm, with ST elevation in AVR and diffuse ST depression in other leads. - EKG on 12/28/2021 at 20:21 showed sinus tachycardia, rate 11 bpm, with PAC and continue ST elevation in aVR and diffuse ST depression but much improved from prior tracing. - EKG on 12/28/2021 at 21:36 showed normal sinus rhythm, rate 81 bpm, with resolution of ST elevation in AVR and diffuse ST depressions but Q waves and T wave inversion in lead III.  Relevant CV Studies: None.  Laboratory  Data:  High Sensitivity Troponin:   Recent Labs  Lab 12/28/21 2024 12/28/21 2224  TROPONINIHS 10 175*      Chemistry Recent Labs  Lab 12/28/21 2024  NA 137  K 3.2*  CL 101  CO2 23  GLUCOSE 112*  BUN 13  CREATININE 1.37*  CALCIUM 8.6*  MG 1.7  GFRNONAA 59*  ANIONGAP 13    Recent Labs  Lab 12/28/21 2024  PROT 6.8  ALBUMIN 3.6  AST 37  ALT 34  ALKPHOS 46  BILITOT 1.0   Lipids No results for input(s): CHOL, TRIG, HDL, LABVLDL, LDLCALC, CHOLHDL in the last 168 hours. Hematology Recent Labs  Lab 12/28/21 2024 12/29/21 0505  WBC 8.2 7.3  RBC 4.96 4.39  HGB 15.6 14.0  HCT 43.7 38.7*  MCV 88.1 88.2  MCH 31.5 31.9  MCHC 35.7 36.2*  RDW 12.0 12.0  PLT 295 277   Thyroid  Recent Labs  Lab 12/28/21 2024  TSH 3.132   BNPNo results for input(s): BNP, PROBNP in the last 168 hours.  DDimer No results for input(s): DDIMER in the last 168 hours.   Radiology/Studies:  DG Chest Portable 1 View  Result Date: 12/28/2021 CLINICAL DATA:  Chest pain and tightness EXAM: PORTABLE CHEST 1 VIEW COMPARISON:  None. FINDINGS: The heart size and mediastinal contours are within normal limits. Both lungs are clear. The visualized skeletal structures are unremarkable. IMPRESSION: No active disease. Electronically Signed   By: Ulyses Jarred M.D.   On: 12/28/2021 20:40     Assessment and Plan:   SVT Patient presented with chest pain and was found to be in SVT with rates in the 220s.  He underwent emergent cardioversion in the ED with restoration of sinus rhythm. - Potassium 3.2. Repleted. - Magnesium 1.7. - TSH normal. - Will check Echo. - Will give dose of Lopressor 90m now prior to coronary CTA and then can start Toprol-XL 237mdaily tomorrow.  Chest Pain Presented with mild chest tightness in the setting of SVT. Initial EKG showed ST elevation in aVR and diffuse ST depression elsewhere when rates were in the 220s. ST changes resolved with restoration of sinus rhythm. Last EKG  showed normal sinus rhythm, rate 81, with isolated T wave inversions in lead III. Initial high-sensitivity troponin 10. Repeat 175 after cardioversion.  - Currently chest pain free. - Will check Echo as above. - Can stop IV Heparin. This is not ACS. However, I would recommend ruling out significant CAD. Discussed with MD - will plan for coronary CTA this afternoon. Will give dose of Lopressor 5020meforehand.  Hypertension BP initially soft while but has since improved.  - Will continue to hold Losartan-HCTZ for now in anticipation for coronary CTA. Can likely restart tomorrow. - Will start beta-blocker as above.   Hyperlipidemia Lipitor listed under PTA medications but patient states he is not taking this and denies any history of hyperlipidemia. Last lipid panel in our system in 2020 showed LDL of 130.  - Will recheck lipid panel.  Mild AKI Creatinine 1.37 on admission. Baseline around 1.0.  - Received fluids in the ED. Will recheck tomorrow.  Suspected Sleep Apnea Patient's wife reports loud snoring at night but does not describe in apneic episodes. BMI 38.92. - Suspect patient has sleep apnea. He has never been test for this. Recommended outpatient sleep study and patient was agreeable to this.   Risk Assessment/Risk Scores:   HEAR Score (for undifferentiated chest pain):  HEAR Score: 4{  Severity of Illness: The appropriate patient status for this patient is OBSERVATION. Observation status is judged to be reasonable and necessary in order to provide the required intensity of  service to ensure the patient's safety. The patient's presenting symptoms, physical exam findings, and initial radiographic and laboratory data in the context of their medical condition is felt to place them at decreased risk for further clinical deterioration. Furthermore, it is anticipated that the patient will be medically stable for discharge from the hospital within 2 midnights of admission.    For  questions or updates, please contact [place] Please consult [email or web address] for contact info under     Signed, Darreld Mclean, PA-C  12/29/2021 2:07 PM     Patient seen and examined. Agree with assessment and plan.  Christian Ramos is a very pleasant 61 year old gentleman who has a history of hypertension and obesity.  He is followed by Dr. Larose Kells and has been on losartan HCT for blood pressure control.  In the past he has been told to have mild abnormality on 1-lead of his ECG.  Last evening after he had completed a 2 mile walk with his wife, he developed a sensation of not feeling quite well.  He did not take his pulse.  He noted some lightheadedness and some shortness of breath.  He presented to [facility] and was found to be in supraventricular tachycardia with a ventricular rate at 223 bpm.  His initial ECG revealed SVT at a rate of 223 with 4 mm of diffuse ST segment depression as well as J-point elevation in aVR consistent with global ischemia.  There was diffuse ST segment depression related to this increased rate.  He was not given adenosine but underwent cardioversion with restoration of sinus rhythm.  He denies any associated chest pain.  He walks at least 4 days/week and denies any change in exercise capacity, exertional dyspnea, sensation of tachycardia, or chest pressure.  Of note, he has a sister who has SVT and ultimately underwent ablation.  His father has known CAD and underwent CABG revascularization.  On arrival to the hospital yesterday potassium was 3.2, magnesium 1.7.  Potassium was repleted.  Subsequent ECG after cardioversion has shown normalization of ST segment depression.  He still has previously noted Q-wave in lead III with T wave inversion which has been present on 2021 ECG.  Presently, he feels well.  His pulse is in the 70s and regular.  Blood pressure was elevated but he had not taken his losartan HCT.  On exam he is in no acute distress, comfortable in bed.   HEENT was unremarkable.  He has a thick neck.  There is no JVD.  Lungs were clear.  Rhythm was regular there was no S3 gallop.  Abdomen was protuberant nontender with positive bowel sounds.  There was no HJR.  There is no significant edema except trivial at the ankles.  Logic exam is nonfocal.  He has normal affect and mood.  I his initial ECG showed global ischemia with 3 to 4 mm of ST segment depression and J-point elevation in aVR during SVT episode.  Recent ECG shows normal sinus rhythm at 81 with complete resolution of ST segment depression with the exception of his previously noted lead III Q wave abnormality and T wave inversion.  I have discussed the patient's presentation with he and his wife.  He will undergo a 2D echo Doppler study.  His body habitus not ideal for nuclear perfusion imaging.  Initial evaluation for CAD unmasked by his rapid tachycardia we will perform a CTA today.  I will give the patient metoprolol tartrate 50 mg now for further reduction  of his pulse which is now in the 70s.  I discussed potential need for FFR and if giving abnormalities identified the potential need for definitive cardiac catheterization.  We have contacted the CTA department with who will perform the study later today.  We will plan to initiate metoprolol succinate tomorrow a.m. and if necessary potential future addition of verapamil if needed.  If coronary calcification is present will need aggressive lipid-lowering strategy with target LDL less than 70.  Plan to recheck lipid panel, LP(a) and follow-up laboratory in a.m.    Troy Sine, MD, [facility] 12/29/2021 2:58 PM

## 2021-12-29 NOTE — ED Notes (Signed)
Patient frustrated about delay.  Explained bed situation.  To notify physician to try for a ED to ED transfer. ?

## 2021-12-29 NOTE — Discharge Summary (Signed)
Discharge Summary    Patient ID: Christian Ramos MRN: [id number]; DOB: Sep 27, 1961  Admit date: 12/28/2021 Discharge date: 12/29/2021  PCP:  Colon Branch, MD   [facility] Cardiologist:  New (Dr. Claiborne Billings)   Discharge Diagnoses    Principal Problem:   SVT (supraventricular tachycardia) [facility]) Active Problems:   Hypertension   Precordial chest pain   Demand ischemia [facility])   Hyperlipidemia   Pulmonary nodules  Diagnostic Studies/Procedures    Coronary CTA 12/29/2021: Impressions: 1. Coronary calcium score of 0. This was 0 percentile for age-, race-, and sex-matched controls. 2. Normal coronary origin with right dominance. 3. No evidence of CAD.  CAD-RADS 0.  Over-Read Interpretation: 1. No acute extracardiac findings. 2. Multiple small nodules scattered throughout the visualized lungs, largest measures 4 mm. Some of these nodules are compatible with calcified granulomas but others are indeterminate. No follow-up needed if patient is low-risk (and has no known or suspected primary neoplasm). Non-contrast chest CT can be considered in 12 months if patient is high-risk. This recommendation follows the consensus statement: Guidelines for Management of Incidental Pulmonary Nodules Detected on CT Images: From the Fleischner Society 2017; Radiology _____________   History of Present Illness     Christian Ramos is a 61 year old male with a history of hypertension, migraines, and obesity who was admitted on 12/28/2021 with SVT after presenting with dizziness and mild chest tightness.  He has no known cardiac history and has never had any cardiac work-up before. He does have a history of hypertension for which he takes Losartan-HCTZ.  He has Lipitor listed under PTA medications but states he is not taking this and does not have a history of hyperlipidemia.  He does have a family history of heart disease.  His father had known CAD and underwent CABG in his 16s.  His sister has SVT and  ultimately required ablation.  He reports remote smoking history in college but none since then.   Presented to the [facility] ED on 12/28/2021 for further evaluation of chest pain. Upon arrival to the ED, EKG showed wide-complex tachycardia with rates in the 220s with ST elevation in aVR and diffuse ST depressions. This was felt to be SVT.  There was concern about giving adenosine with no prior EKGs for comparison.  Therefore, patient underwent emergent electrical cardioversion with restoration of sinus rhythm.  EKG following cardioversion showed sinus tachycardia, rate 111-minute, with mild ST elevation in aVR and continued diffuse ST depression but much improved.  However, ST depression completely resolved as heart rate slowed.  Initial high-sensitivity troponin negative at 10 but repeat 175 (after cardioversion).  Chest x-ray showed no acute findings. WBC 8.2, Hgb 15.6, Plts 295. Na 137, K 3.2, Glucose 112, BUN 13, Cr 1.37. Albumin 3.6, AST 37, ALT 34, Alk Phos 46, Total Bili 1.0. Magnesium 1.7. TSH normal. Respiratory panel negative for COVID and influenza A/B.  Patient was started on IV heparin and admitted to [facility].   Upon arrival to [facility], patient was resting comfortably in no acute distress. He was maintaining sinus rhythm with rates in the 70s to 80s. He denied any symptoms since restoration of sinus rhythm. He reported he was in his usual state of health until the evening of 12/28/2021.  He was walking 2 miles which she gets frequently and noticed worsening in that he was not feeling well.  He described some lightheadedness and some mild shortness of breath as well as some vague chest tightness of  his right upper chest near his clavicle. Symptoms lasted for about an hour before he decided to go to the ED for further evaluation. He denied any palpitations and was unaware of his rapid heart rate. No syncope.  He has never had this happen before.  No other chest pain.  No orthopnea, PND,  lower extremity edema.  He denied any recent fevers or illnesses.  No cough, nasal congestion, nausea, vomiting, diarrhea.  No abnormal bleeding in urine or stools.  His wife reported that he snores loudly at night but she did not describe any apneic episodes.  He has never been tested for sleep apnea.  Hospital Course     Consultants: None  SVT Chest Pain Demand Ischemia Patient presented with dizziness and vague chest tightness as stated above and was found to be in SVT with rates >200 bpm. Initial EKG showed SVT, rate 223 bpm, with ST elevation in aVR and diffuse ST depression. ST changes ultimately resolved with restoration of sinus rhythm. Initial high-sensitivity troponin 10. Repeat was 175 after cardioversion. He was mildly hypokalemic with potassium of 3.2 and this was repleted. Magnesium 1.7. TSH normal. Coronary CTA was ordered to rule out ischemia and showed a coronary calcium score of 0 with no evidence of CAD. Initial plan was to keep overnight with Echo in the morning but is patient very eager to go home. He is asymptomatic and vitals are stable so he is stable for discharge. Will have patient start Toprol-XL 29m daily tomorrow. He was instructed to monitor his BP and heart rate closely for the next several days. If his heart rate drops below 55 bpm, he can reduce his Toprol-XL to 226mdaily. Will arrange close follow-up with me in the office next week and will order outpatient Echo at that time.  Hypertension BP initially soft in setting of SVT but has since improved and now mildly elevated. Will have patient restart home Losartan-HCTZ 100-2572maily tomorrow. Added Toprol-XL as above. Patient was advised to monitor BP and heart closely and notify us [place] his BP drops too low or he develops symptoms of hypotension.  Hyperlipidemia Lipitor listed under PTA medications but patient states he is not taking this and denies any history of hyperlipidemia. Last lipid panel in our system in 2020  showed LDL of 130.  Will recheck lipid panel and lipoprotein (a) at follow-up visit.   Mild AKI - Resolved Creatinine 1.37 on admission but improved to 1.10 after fluids and restoration of sinus rhythm. Baseline around 1.0. Will recheck BMET at follow-up visit.  Suspected Sleep Apnea Patient's wife reports loud snoring at night but does not describe any apneic episodes. BMI 38.92. Suspect patient has sleep apnea. He has never been tested for this. Will order outpatient sleep study at follow-up visit.  Pulmonary Nodules Coronary CTA incidentally showed multiple small nodules scattered throughout the lungs (largest measures 4mm29mSome nodules are compatible with calcified granulomas but others indeterminate. He does have a remote smoking history in college but none since. Non-contrast chest CT recommended in 12 months. Patient can follow-up with PCP for this.  Patient seen and examined by Dr. KellClaiborne Billings determined to be stable for discharge. Will arrange outpatient follow-up visit. Medications as below.  Did the patient have an acute coronary syndrome (MI, NSTEMI, STEMI, etc) this admission?:  No.   The elevated Troponin was due to the acute medical illness (demand ischemia).   _____________  Discharge Vitals Blood pressure (!) 120/94, pulse 60, temperature 98 F (  36.7 C), temperature source Oral, resp. rate 17, height '5\' 8"'  (1.727 m), weight 116.1 kg, SpO2 97 %.  Filed Weights   12/28/21 2002 12/29/21 1232  Weight: 113.3 kg 116.1 kg    Labs & Radiologic Studies    CBC Recent Labs    12/28/21 2024 12/29/21 0505 12/29/21 1627  WBC 8.2 7.3 5.6  NEUTROABS 3.8  --   --   HGB 15.6 14.0 14.6  HCT 43.7 38.7* 41.9  MCV 88.1 88.2 89.3  PLT 295 277 332   Basic Metabolic Panel Recent Labs    12/28/21 2024 12/29/21 1627  NA 137  --   K 3.2*  --   CL 101  --   CO2 23  --   GLUCOSE 112*  --   BUN 13  --   CREATININE 1.37* 1.10  CALCIUM 8.6*  --   MG 1.7  --    Liver Function  Tests Recent Labs    12/28/21 2024  AST 37  ALT 34  ALKPHOS 46  BILITOT 1.0  PROT 6.8  ALBUMIN 3.6   No results for input(s): LIPASE, AMYLASE in the last 72 hours. High Sensitivity Troponin:   Recent Labs  Lab 12/28/21 2024 12/28/21 2224  TROPONINIHS 10 175*    BNP Invalid input(s): POCBNP D-Dimer No results for input(s): DDIMER in the last 72 hours. Hemoglobin A1C No results for input(s): HGBA1C in the last 72 hours. Fasting Lipid Panel No results for input(s): CHOL, HDL, LDLCALC, TRIG, CHOLHDL, LDLDIRECT in the last 72 hours. Thyroid Function Tests Recent Labs    12/28/21 2024  TSH 3.132   _____________  CT CORONARY MORPH W/CTA COR W/SCORE W/CA W/CM &/OR WO/CM  Addendum Date: 12/29/2021   ADDENDUM REPORT: 12/29/2021 16:54 CLINICAL DATA:  14M with hypertension, obesity, SVT, and chest tightness. EXAM: Cardiac/Coronary  CT TECHNIQUE: The patient was scanned on a [employer]. FINDINGS: A 120 kV prospective scan was triggered in the descending thoracic aorta at 111 HU's. Axial non-contrast 3 mm slices were carried out through the heart. The data set was analyzed on a dedicated work station and scored using the [place]. Gantry rotation speed was 250 msecs and collimation was .6 mm. No beta blockade and 0.8 mg of sl NTG was given. The 3D data set was reconstructed in 5% intervals of the 67-82 % of the R-R cycle. Diastolic phases were analyzed on a dedicated work station using MPR, MIP and VRT modes. The patient received 80 cc of contrast. Aorta: Normal size.  No calcifications.  No dissection. Aortic Valve:  Trileaflet.  Mild calcification. Coronary Arteries:  Normal coronary origin.  Right dominance. RCA is a non-dominant artery that gives rise to PDA and PLVB. There is no plaque. Left main is a large artery that gives rise to LAD, RI and LCX arteries. LAD is a large vessel that has no plaque. LCX is a large dominant artery that gives rise to OM and L-PDA. There is  no plaque. Coronary Calcium Score: 0 Percentile: 0 Other findings: Normal pulmonary vein drainage into the left atrium. Normal let atrial appendage without a thrombus. Normal size of the pulmonary artery. IMPRESSION: 1. Coronary calcium score of 0. This was 0 percentile for age-, race-, and sex-matched controls. 2. Normal coronary origin with right dominance. 3. No evidence of CAD.  CAD-RADS 0. Skeet Latch, MD Electronically Signed   By: Skeet Latch M.D.   On: 12/29/2021 16:54   Result Date: 12/29/2021 EXAM: OVER-READ INTERPRETATION  CT CHEST The following report is an over-read performed by radiologist Dr. Markus Daft of [facility], [place] on 12/29/2021. This over-read does not include interpretation of cardiac or coronary anatomy or pathology. The coronary calcium score/coronary CTA interpretation by the cardiologist is attached. COMPARISON:  None. FINDINGS: Vascular: Normal caliber of the visualized thoracic aorta. Mediastinum/Nodes: Visualized mediastinal structures are normal. Lungs/Pleura: 4 mm nodule in the periphery of the right middle lobe is likely calcified and probably represents a calcified granuloma. Additional punctate nodular densities near the right hemidiaphragm on sequence 11 image 28 which may be calcified but indeterminate. Additional 4 mm nodule which is probably calcified in the right lung on sequence 11, image 6. Additional small nodular densities in left lung, some of which may be calcified. No pleural effusions. No large areas of lung consolidation. Upper Abdomen: No acute abnormality. Musculoskeletal: No acute abnormality. IMPRESSION: 1. No acute extracardiac findings. 2. Multiple small nodules scattered throughout the visualized lungs, largest measures 4 mm. Some of these nodules are compatible with calcified granulomas but others are indeterminate. No follow-up needed if patient is low-risk (and has no known or suspected primary neoplasm). Non-contrast chest CT can be  considered in 12 months if patient is high-risk. This recommendation follows the consensus statement: Guidelines for Management of Incidental Pulmonary Nodules Detected on CT Images: From the Fleischner Society 2017; Radiology 2017; 284:228-243. Electronically Signed: By: Markus Daft M.D. On: 12/29/2021 16:02   DG Chest Portable 1 View  Result Date: 12/28/2021 CLINICAL DATA:  Chest pain and tightness EXAM: PORTABLE CHEST 1 VIEW COMPARISON:  None. FINDINGS: The heart size and mediastinal contours are within normal limits. Both lungs are clear. The visualized skeletal structures are unremarkable. IMPRESSION: No active disease. Electronically Signed   By: Ulyses Jarred M.D.   On: 12/28/2021 20:40   Disposition   Patient is being discharged home today in good condition.  Follow-up Plans & Appointments     Follow-up Information     Darreld Mclean, PA-C Follow up.   Specialties: Physician Assistant, Cardiology Why: Hospital follow-up with Cardiology scheduled for 01/05/2022 at 3:10pm. Please arrive 15 minutes early for check-in. Contact information: [address] [place] [address] [phone]                Discharge Instructions     Diet - low sodium heart healthy   Complete by: As directed    Increase activity slowly   Complete by: As directed        Discharge Medications   Allergies as of 12/29/2021   No Known Allergies      Medication List     STOP taking these medications    atorvastatin 20 MG tablet Commonly known as: LIPITOR   doxycycline 100 MG capsule Commonly known as: VIBRAMYCIN   predniSONE 10 MG tablet Commonly known as: DELTASONE   promethazine-dextromethorphan 6.25-15 MG/5ML syrup Commonly known as: PROMETHAZINE-DM   tiZANidine 4 MG capsule Commonly known as: ZANAFLEX       TAKE these medications    albuterol 108 (90 Base) MCG/ACT inhaler Commonly known as: Ventolin HFA Inhale 2 puffs into the lungs every 6 (six)  hours as needed for wheezing or shortness of breath.   azelastine 0.1 % nasal spray Commonly known as: ASTELIN Place 2 sprays into both nostrils 2 (two) times daily. What changed:  when to take this reasons to take this   eletriptan 20 MG tablet Commonly known as: RELPAX Take 1 tablet (20 mg  total) by mouth as needed for migraine or headache. May repeat in 2 hours if headache persists or recurs.  No more than 2 tablets in a 24-hour period   gabapentin 100 MG capsule Commonly known as: Neurontin Take 1 capsule at bedtime for one week, then increase to 2 capsules at bedtime What changed:  how much to take how to take this when to take this additional instructions   loratadine 10 MG tablet Commonly known as: CLARITIN Take 10 mg by mouth daily.   losartan-hydrochlorothiazide 100-25 MG tablet Commonly known as: HYZAAR TAKE 1 TABLET DAILY   metoprolol succinate 50 MG 24 hr tablet Commonly known as: TOPROL-XL Take 1 tablet (50 mg total) by mouth daily. Start taking on: December 30, 2021   multivitamin with minerals Tabs tablet Take 1 tablet by mouth daily.           Outstanding Labs/Studies   Needs BMET and lipid panel/lipoprotein (a) at follow-up visit.  Duration of Discharge Encounter   Greater than 30 minutes including physician time.  Signed, Darreld Mclean, PA-C 12/29/2021, 7:39 PM

## 2021-12-29 NOTE — ED Notes (Signed)
Patient repositioned for comfort. Updated on plan of care.

## 2022-01-01 ENCOUNTER — Telehealth: Payer: Self-pay

## 2022-01-01 NOTE — Telephone Encounter (Signed)
Appointment scheduled as requested.  Pt notified he will arrive early. ?

## 2022-01-01 NOTE — Telephone Encounter (Signed)
-----   Message from Corrin Parker, PA-C sent at 12/29/2021  7:40 PM EST ----- ?Regarding: Schedule 3/10 ?Burley Saver, ? ?Can you help me with something? I attached you on another staff message about my 3:10pm patient on 01/05/2022 [facility]) who needs to be moved to a MD's schedule since she is a new patient. Can you please help get her rescheduled and can you put this patient in the 3:10pm slot. He is someone we saw in the hospital today for SVT. We initially were going to keep him overnight but he was very eager to go home so he needs a close follow-up visit. I told him that I would add him to my schedule at this time. Can you help me with this please? ? ?I am off on Monday but feel free to call me if you have any questions. ? ?Thanks so much! ?Callie ? ?

## 2022-01-01 NOTE — Progress Notes (Signed)
Cardiology Office Note:    Date:  01/05/2022   ID:  Christian MylarVernon C Aufiero, DOB [birth year], MRN [id number]  PCP:  Wanda PlumpPaz, Jose E, MD  Cardiologist:  Nicki Guadalajarahomas Kelly, MD  Electrophysiologist:  None   Referring MD: Wanda PlumpPaz, Jose E, MD   Chief Complaint: hospital follow-up of SVT  History of Present Illness:    Christian Ramos is a 61 y.o. male with a history of normal coronaries on recent coronary CTA on 12/29/2021, SVT, hypertension, asthma, pulmonary nodules, migraines, and obesity who is followed by Dr. Tresa EndoKelly and presents today for hospital follow-up of SVT.  Patient was first seen by Cardiology during recent admission. He was admitted from 12/28/2021 and 12/29/2021 after [facility] with dizziness and mild chest pain and being found to be in SVT with rates >220s. He underwent emergent cardioversion in the ED with restoration of sinus rhythm. He had significant EKG changes while in SVT with ST elevation in aVR and diffuse ST elevation elsewhere but these changes resolved with restoration of sinus rhythm. Initial high-sensitivity was negative but repeat mildly elevated at 175 (after cardioversion). Patient was [facility] and see by Cardiology. Coronary CTA was ordered to rule out ischemia and showed a coronary calcium score of 0 with no evidence of CAD. Initial plan was to observe him overnight and get an Echo the next day; however, patient was very eager to go home so he was discharged on Toprol-XL with plans for outpatient Echo.  Patient presents today for follow-up. Here with wife. Patient has done well since discharge. He denies any recurrent episodes of SVT. Heart rates have been in the 50s to 80s at home. No chest pain, shortness of breath, orthopnea, PND, palpitations, lightheadedness, dizziness, or syncope. He is tolerating the Toprol-XL well and states he feels better than he has in weeks. He has starting exercising again and has had no issues with this.  Past  Medical History:  Diagnosis Date   Anosmia    Hypertension    Obesity (BMI 30-39.9)    Vitreous hemorrhage (HCC) 09/2018   R    Past Surgical History:  Procedure Laterality Date   NECK SURGERY  ~ 2000   mass removed anteriorly, Dr Cloria SpringWoliki   VASECTOMY  ~2000    Current Medications: Current Meds  Medication Sig   albuterol (VENTOLIN HFA) 108 (90 Base) MCG/ACT inhaler Inhale 2 puffs into the lungs every 6 (six) hours as needed for wheezing or shortness of breath.   eletriptan (RELPAX) 20 MG tablet Take 1 tablet (20 mg total) by mouth as needed for migraine or headache. May repeat in 2 hours if headache persists or recurs.  No more than 2 tablets in a 24-hour period   gabapentin (NEURONTIN) 100 MG capsule Take 1 capsule at bedtime for one week, then increase to 2 capsules at bedtime   loratadine (CLARITIN) 10 MG tablet Take 10 mg by mouth daily.   losartan-hydrochlorothiazide (HYZAAR) 100-25 MG tablet TAKE 1 TABLET DAILY   metoprolol succinate (TOPROL-XL) 50 MG 24 hr tablet Take 1 tablet (50 mg total) by mouth daily.   Multiple Vitamin (MULTIVITAMIN WITH MINERALS) TABS tablet Take 1 tablet by mouth daily.     Allergies:   Patient has no known allergies.   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  Tobacco Use   Smoking status: Never   Smokeless tobacco: Never  Substance and Sexual Activity   Alcohol use: Yes    Alcohol/week: 0.0 standard drinks    Comment: Social   Drug use: No   Sexual activity: Not on file  Other Topics Concern   Not on file  Social History Narrative   2 daughters, one married lives in [place], other is  married, lives in [place]        Social Determinants of Health   Financial Resource Strain: Not on file  Food Insecurity:  Not on file  Transportation Needs: Not on file  Physical Activity: Not on file  Stress: Not on file  Social Connections: Not on file     Family History: The patient's family history includes Breast cancer in his mother; Diabetes in his mother; Heart disease (age of onset: 72) in his father; Hypertension in his mother; Prostate cancer (age of onset: 25) in his father. There is no history of Colon cancer.  ROS:   Please see the history of present illness.    EKGs/Labs/Other Studies Reviewed:    The following studies were reviewed today:  Coronary CTA 12/29/2021: Impressions: 1. Coronary calcium score of 0. This was 0 percentile for age-, race-, and sex-matched controls. 2. Normal coronary origin with right dominance. 3. No evidence of CAD.  CAD-RADS 0.  EKG:  EKG not ordered today.   Recent Labs: 12/28/2021: ALT 34; BUN 13; Magnesium 1.7; Potassium 3.2; Sodium 137; TSH 3.132 12/29/2021: Creatinine, Ser 1.10; Hemoglobin 14.6; Platelets 293  Recent Lipid Panel    Component Value Date/Time   CHOL 204 (H) 11/10/2021 0829   TRIG 211.0 (H) 11/10/2021 0829   HDL 44.40 11/10/2021 0829   CHOLHDL 5 11/10/2021 0829   VLDL 42.2 (H) 11/10/2021 0829   LDLCALC 86 06/20/2020 1347   LDLDIRECT 117.0 11/10/2021 0829    Physical Exam:    Vital Signs: BP 140/86    Pulse 77    Ht 5\' 8"  (1.727 m)    Wt 260 lb 12.8 oz (118.3 kg)    SpO2 99%    BMI 39.65 kg/m     Wt Readings from Last 3 Encounters:  01/05/22 260 lb 12.8 oz (118.3 kg)  12/29/21 255 lb 15.3 oz (116.1 kg)  11/23/21 255 lb 12.8 oz (116 kg)     General: 61 y.o. Caucasian male in no acute distress. HEENT: Normocephalic and atraumatic. Sclera clear.  Neck: Supple. No JVD. Heart: RRR. Distinct S1 and S2. No murmurs, gallops, or rubs.  Lungs: No increased work of breathing. Clear to ausculation bilaterally. No wheezes, rhonchi, or rales.  Abdomen: Soft, non-distended, and non-tender to palpation.  MSK: Normal strength and tone for  age.  Extremities: Trace lower extremity edema bilaterally. Skin: Warm and dry. Neuro: Alert and oriented x3. No focal deficits. Psych: Normal affect. Responds appropriately.   Assessment:    1. Paroxysmal SVT (supraventricular tachycardia) (HCC)   2. Primary hypertension   3. Hyperlipidemia, unspecified hyperlipidemia type   4. Mild AKI   5. Snoring   6. Pulmonary nodules     Plan:    Paroxysmal SVT Recently admitted with SVT with rates >220 bpm. Required emergent cardioversion. Coronary CTA showed coronary calcium score of 0 with no evidence of ischemia.  - Doing well with no known recurrence.  - Continue Toprol-XL 50mg  daily.  -  Will check Echo. - Reviewed vagal maneuvers.  Hypertension BP mildly elevated. Initially 130/94 and then 140/86 on my recheck. Patient states he monitor BP at home and it is always at goal of <130/80. - Continue Losartan-HCTZ 100-25mg  daily. - Continue Toprol-XL 50mg  daily. - Advised patient to continue to monitor BP and let know if consistently >130/80.   Hyperlipidemia Lipitor was listed under PTA medications prior to recent hospitilaziation but patient reported he was not taking this and denied any history of hyperlipidemia. Last lipid panel in our system in 2020 showed LDL of 130.  - Patient is not fasting today but will having him return for  lipid panel and lipoprotein (a).   Mild AKI  Patient had mild AKI during recent admission with creatinine peaking at 1.37 but improved to 1.10 after fluids and restoration of sinus rhythm. Baseline around 1.0.  - Will recheck BMET today.    Snoring Suspected Sleep Apnea Patient's wife reported loud snoring at night but did not describe any apneic episodes. BMI 38.92.  - Suspect patient has sleep apnea. He has never been tested for this. Will order sleep study.   Pulmonary Nodules Recent coronary CTA incidentally showed multiple small nodules scattered throughout the lungs (largest measures 78mm).  Some nodules are compatible with calcified granulomas but others indeterminate. He does have a remote smoking history in college but none since.  - Non-contrast chest CT recommended in 12 months. Patient can follow-up with PCP for this.  Disposition: Follow up in  6 months.    Medication Adjustments/Labs and Tests Ordered: Current medicines are reviewed at length with the patient today.  Concerns regarding medicines are outlined above.  Orders Placed This Encounter  Procedures   Basic metabolic panel   Lipid panel   Lipoprotein A (LPA)   ECHOCARDIOGRAM COMPLETE   Home sleep test   No orders of the defined types were placed in this encounter.   Patient Instructions  Medication Instructions:  Your physician recommends that you continue on your current medications as directed. Please refer to the Current Medication list given to you today.  *If you need a refill on your cardiac medications before your next appointment, please call your pharmacy*   Lab Work: TODAY:  BMET  WHEN YOU COME IN FOR ECHOCARDIOGRAM , COME FASTING AND WE WILL CHECK:  LIPID & LIPOA If you have labs (blood work) drawn today and your tests are completely normal, you will receive your results only by: MyChart Message (if you have MyChart) OR A paper copy in the mail If you have any lab test that is abnormal or we need to change your treatment, we will call you to review the results.   Testing/Procedures:  Your physician has requested that you have an echocardiogram. Echocardiography is a painless test that uses sound waves to create images of your heart. It provides your doctor with information about the size and shape of your heart and how well your hearts chambers and valves are working. This procedure takes approximately one hour. There are no restrictions for this procedure.  Your physician has recommended that you have a sleep study. This test records several body functions during sleep, including:  brain activity, eye movement, oxygen and carbon dioxide blood levels, heart rate and rhythm, breathing rate and rhythm, the flow of air through your mouth and nose, snoring, body muscle movements, and chest and belly movement.  SOMEONE WILL CALL YOU WITH INSTRUCTIONS    Follow-Up: At [facility], you and  your health needs are our priority.  As part of our continuing mission to provide you with exceptional heart care, we have created designated Provider Care Teams.  These Care Teams include your primary Cardiologist (physician) and Advanced Practice Providers (APPs -  Physician Assistants and Nurse Practitioners) who all work together to provide you with the care you need, when you need it.  We recommend signing up for the patient portal called "MyChart".  Sign up information is provided on this After Visit Summary.  MyChart is used to connect with patients for Virtual Visits (Telemedicine).  Patients are able to view lab/test results, encounter notes, upcoming appointments, etc.  Non-urgent messages can be sent to your provider as well.   To learn more about what you can do with MyChart, go to [email or web address].    Your next appointment:   6 month(s)  The format for your next appointment:   In Person  Provider:   Nicki Guadalajara, MD  or Marjie Skiff, PA-C        Other Instructions    Signed, Corrin Parker, PA-C  01/05/2022 5:42 PM    [facility]

## 2022-01-05 ENCOUNTER — Other Ambulatory Visit: Payer: Self-pay

## 2022-01-05 ENCOUNTER — Other Ambulatory Visit: Payer: Self-pay | Admitting: *Deleted

## 2022-01-05 ENCOUNTER — Ambulatory Visit (INDEPENDENT_AMBULATORY_CARE_PROVIDER_SITE_OTHER): Payer: BC Managed Care – PPO | Admitting: Student

## 2022-01-05 ENCOUNTER — Encounter: Payer: Self-pay | Admitting: Student

## 2022-01-05 VITALS — BP 140/86 | HR 77 | Ht 68.0 in | Wt 260.8 lb

## 2022-01-05 DIAGNOSIS — E785 Hyperlipidemia, unspecified: Secondary | ICD-10-CM

## 2022-01-05 DIAGNOSIS — N179 Acute kidney failure, unspecified: Secondary | ICD-10-CM | POA: Diagnosis not present

## 2022-01-05 DIAGNOSIS — R0683 Snoring: Secondary | ICD-10-CM

## 2022-01-05 DIAGNOSIS — I10 Essential (primary) hypertension: Secondary | ICD-10-CM

## 2022-01-05 DIAGNOSIS — R918 Other nonspecific abnormal finding of lung field: Secondary | ICD-10-CM

## 2022-01-05 DIAGNOSIS — I471 Supraventricular tachycardia: Secondary | ICD-10-CM

## 2022-01-05 NOTE — Patient Instructions (Addendum)
Medication Instructions:  ?Your physician recommends that you continue on your current medications as directed. Please refer to the Current Medication list given to you today. ? ?*If you need a refill on your cardiac medications before your next appointment, please call your pharmacy* ? ? ?Lab Work: ?TODAY:  BMET ? ?WHEN YOU COME IN FOR ECHOCARDIOGRAM ?, COME FASTING AND WE WILL CHECK:  LIPID & LIPOA ?If you have labs (blood work) drawn today and your tests are completely normal, you will receive your results only by: ?MyChart Message (if you have MyChart) OR ?A paper copy in the mail ?If you have any lab test that is abnormal or we need to change your treatment, we will call you to review the results. ? ? ?Testing/Procedures: ? ?Your physician has requested that you have an echocardiogram. Echocardiography is a painless test that uses sound waves to create images of your heart. It provides your doctor with information about the size and shape of your heart and how well your heart?s chambers and valves are working. This procedure takes approximately one hour. There are no restrictions for this procedure. ? ?Your physician has recommended that you have a sleep study. This test records several body functions during sleep, including: brain activity, eye movement, oxygen and carbon dioxide blood levels, heart rate and rhythm, breathing rate and rhythm, the flow of air through your mouth and nose, snoring, body muscle movements, and chest and belly movement.  SOMEONE WILL CALL YOU WITH INSTRUCTIONS ? ? ? ?Follow-Up: ?At [facility], you and your health needs are our priority.  As part of our continuing mission to provide you with exceptional heart care, we have created designated Provider Care Teams.  These Care Teams include your primary Cardiologist (physician) and Advanced Practice Providers (APPs -  Physician Assistants and Nurse Practitioners) who all work together to provide you with the care you need, when you  need it. ? ?We recommend signing up for the patient portal called "MyChart".  Sign up information is provided on this After Visit Summary.  MyChart is used to connect with patients for Virtual Visits (Telemedicine).  Patients are able to view lab/test results, encounter notes, upcoming appointments, etc.  Non-urgent messages can be sent to your provider as well.   ?To learn more about what you can do with MyChart, go to [email or web address].   ? ?Your next appointment:   ?6 month(s) ? ?The format for your next appointment:   ?In Person ? ?Provider:   ?Nicki Guadalajara, MD  or Marjie Skiff, PA-C      ? ? ?Other Instructions ? ?

## 2022-01-06 LAB — BASIC METABOLIC PANEL
BUN/Creatinine Ratio: 18 (ref 10–24)
BUN: 20 mg/dL (ref 8–27)
CO2: 26 mmol/L (ref 20–29)
Calcium: 9.4 mg/dL (ref 8.6–10.2)
Chloride: 104 mmol/L (ref 96–106)
Creatinine, Ser: 1.12 mg/dL (ref 0.76–1.27)
Glucose: 90 mg/dL (ref 70–99)
Potassium: 4.6 mmol/L (ref 3.5–5.2)
Sodium: 143 mmol/L (ref 134–144)
eGFR: 75 mL/min/{1.73_m2} (ref >59)

## 2022-01-12 ENCOUNTER — Telehealth: Payer: Self-pay | Admitting: *Deleted

## 2022-01-12 NOTE — Telephone Encounter (Signed)
Left HST appointment details on voicemail. 

## 2022-01-18 ENCOUNTER — Ambulatory Visit (HOSPITAL_COMMUNITY): Payer: BC Managed Care – PPO | Attending: Cardiovascular Disease

## 2022-01-18 ENCOUNTER — Other Ambulatory Visit: Payer: Self-pay

## 2022-01-18 DIAGNOSIS — I471 Supraventricular tachycardia: Secondary | ICD-10-CM | POA: Diagnosis not present

## 2022-01-18 DIAGNOSIS — R918 Other nonspecific abnormal finding of lung field: Secondary | ICD-10-CM | POA: Diagnosis not present

## 2022-01-18 DIAGNOSIS — E785 Hyperlipidemia, unspecified: Secondary | ICD-10-CM | POA: Insufficient documentation

## 2022-01-18 DIAGNOSIS — N179 Acute kidney failure, unspecified: Secondary | ICD-10-CM | POA: Insufficient documentation

## 2022-01-18 DIAGNOSIS — I10 Essential (primary) hypertension: Secondary | ICD-10-CM | POA: Insufficient documentation

## 2022-01-18 DIAGNOSIS — R0683 Snoring: Secondary | ICD-10-CM | POA: Insufficient documentation

## 2022-01-18 LAB — ECHOCARDIOGRAM COMPLETE
Area-P 1/2: 3.77 cm2
S' Lateral: 3.2 cm

## 2022-01-19 ENCOUNTER — Encounter: Payer: Self-pay | Admitting: Internal Medicine

## 2022-02-12 ENCOUNTER — Other Ambulatory Visit: Payer: Self-pay

## 2022-02-12 ENCOUNTER — Telehealth: Payer: Self-pay

## 2022-02-12 ENCOUNTER — Encounter: Payer: Self-pay | Admitting: Internal Medicine

## 2022-02-12 ENCOUNTER — Ambulatory Visit (INDEPENDENT_AMBULATORY_CARE_PROVIDER_SITE_OTHER): Payer: BC Managed Care – PPO | Admitting: Internal Medicine

## 2022-02-12 VITALS — BP 128/82 | HR 49 | Temp 97.9°F | Resp 16 | Ht 68.0 in | Wt 258.4 lb

## 2022-02-12 DIAGNOSIS — E669 Obesity, unspecified: Secondary | ICD-10-CM

## 2022-02-12 MED ORDER — WEGOVY 0.25 MG/0.5ML ~~LOC~~ SOAJ: 0.2500 mg | SUBCUTANEOUS | 0 refills | Status: DC

## 2022-02-12 MED ORDER — METOPROLOL SUCCINATE ER 50 MG PO TB24: 50.0000 mg | ORAL_TABLET | Freq: Every day | ORAL | 3 refills | Status: DC

## 2022-02-12 NOTE — Patient Instructions (Signed)
Start with Wegovy , one shot weekly. ? ?Call in 3 weeks and let us know how you are doing. ? ?We will ramp up the dose gradually, see below. ? ?Recommend a structure diet (weight watchers, Noom, GOLO, calorie counting or other modality) ? ?Continue taking regular walks ? ? ?TDDUKG schedule  ? ? ?Week 1 through week 4 : 0.25 mg once weekly. ? ?Week 5 through week 8: 0.5 mg once weekly. ? ?Week 9 through week 12: 1 mg once weekly. ? ?Week 13 through week 16: 1.7 mg once weekly. ? ?  ? ? ?GO TO THE FRONT DESK, PLEASE SCHEDULE YOUR APPOINTMENTS ?Come back for   a checkup in 3 months ?

## 2022-02-12 NOTE — Telephone Encounter (Signed)
PA initiated via Covermymeds; KEY: [id number]. PA cancelled.  ? ?An active PA is already on file with expiration date of 06/25/2022. Please wait to resubmit request within 60 days of that expiration date to obtain a PA renewal. ?

## 2022-02-12 NOTE — Progress Notes (Signed)
? ?Subjective:  ? ? Patient ID: Christian Ramos, male    DOB: 15-Nov-1960, 61 y.o.   MRN: [id number] ? ?DOS:  02/12/2022 ?Type of visit - description: Follow-up ? ?Here for obesity management ?Has been obese the majority of his adult life. ? ?Recently diagnosed with SVT.  Chart reviewed ? ?Currently doing well with no symptoms. 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 ?Wt Readings from Last 3 Encounters:  ?02/12/22 258 lb 6 oz (117.2 kg)  ?01/05/22 260 lb 12.8 oz (118.3 kg)  ?12/29/21 255 lb 15.3 oz (116.1 kg)  ? ? ? ?Review of Systems ?See above  ? ?Past Medical History:  ?Diagnosis Date  ? Anosmia   ? Hypertension   ? Obesity (BMI 30-39.9)   ? Vitreous hemorrhage (HCC) 09/2018  ? R  ? ? ?Past Surgical History:  ?Procedure Laterality Date  ? NECK SURGERY  ~ 2000  ? mass removed anteriorly, Dr Cloria Spring  ? VASECTOMY  ~2000  ? ? ?Current Outpatient Medications  ?Medication Instructions  ? albuterol (VENTOLIN HFA) 108 (90 Base) MCG/ACT inhaler 2 puffs, Inhalation, Every 6 hours PRN  ? eletriptan (RELPAX) 20 mg, Oral, As needed, May repeat in 2 hours if headache persists or recurs.  No more than 2 tablets in a 24-hour period  ? gabapentin (NEURONTIN) 100 MG capsule Take 1 capsule at bedtime for one week, then increase to 2 capsules at bedtime  ? loratadine (CLARITIN) 10 mg, Daily  ? losartan-hydrochlorothiazide (HYZAAR) 100-25 MG tablet TAKE 1 TABLET DAILY  ? metoprolol succinate (TOPROL-XL) 50 mg, Oral, Daily  ? Multiple Vitamin (MULTIVITAMIN WITH MINERALS) TABS tablet 1 tablet, Oral, Daily  ? ? ?   ?Objective:  ? Physical Exam ?BP 128/82 (BP Location: Left Arm, Patient Position: Sitting, Cuff Size: Normal)   Pulse (!) 49   Temp 97.9 ?F (36.6 ?C) (Oral)   Resp 16   Ht 5\' 8"  (1.727 m)   Wt 258 lb 6 oz (117.2 kg)   SpO2 93%   BMI 39.29 kg/m?  ?General:   ?Well developed, NAD, BMI noted. ?HEENT:  ?Normocephalic . Face symmetric, atraumatic ?Skin: Not pale. Not jaundice ?Neurologic:   ?alert & oriented X3.  ?Speech normal, gait appropriate for age and unassisted ?Psych--  ?Cognition and judgment appear intact.  ?Cooperative with normal attention span and concentration.  ?Behavior appropriate. ?No anxious or depressed appearing.  ? ?   ?Assessment   ? ? Assessment  (transfer from [facility] 04/17/2016) ?HTN  ?Obesity ?Exercise  induced asthma ?OSA? ?HA-anosmia: saw neuro, last OV 11-2016; MRI (-), likely cervicogenic HA, pt declined Gabapentin, RX muscle relaxants, PT ?R Vitreous hemorrhage 09/2018 ?SVT Dx 12-2021. ? ? ?PLAN ?Obesity: BMI 39, no diabetes, + hypertension,  neg coronary CTA 12/29/2021. ?He weighed 192 pounds in high school, since 1994 gradually increase his weight.  It ranged from 228 to 258 pounds. ?In a couple of occasions have been able to decrease her weight to 210, 225 pounds by eating healthier and exercising but at this point , wt loss has been very difficult. ?Request medication, we had extensive talk about Wegovy, samples and have to use it provided.  Strongly encourage structured diet. ?See AVS ?SVT: Admitted to the hospital with SVT, chart reviewed.  Currently asymptomatic ?RTC 3 months ?  ? ?This visit occurred during the SARS-CoV-2 public health emergency.  Safety protocols were in place, including screening questions prior to the visit, additional usage of staff PPE,  and extensive cleaning of exam room while observing appropriate contact time as indicated for disinfecting solutions.  ? ?

## 2022-02-13 NOTE — Assessment & Plan Note (Signed)
Obesity: BMI 39, no diabetes, + hypertension,  neg coronary CTA 12/29/2021. ?He weighed 192 pounds in high school, since 1994 gradually increase his weight.  It ranged from 228 to 258 pounds. ?In a couple of occasions have been able to decrease her weight to 210, 225 pounds by eating healthier and exercising but at this point , wt loss has been very difficult. ?Request medication, we had extensive talk about Wegovy, samples and have to use it provided.  Strongly encourage structured diet. ?See AVS ?SVT: Admitted to the hospital with SVT, chart reviewed.  Currently asymptomatic ?RTC 3 months ?  ?

## 2022-02-16 ENCOUNTER — Ambulatory Visit (HOSPITAL_BASED_OUTPATIENT_CLINIC_OR_DEPARTMENT_OTHER): Payer: BC Managed Care – PPO | Attending: Student | Admitting: Cardiovascular Disease

## 2022-02-16 DIAGNOSIS — I10 Essential (primary) hypertension: Secondary | ICD-10-CM | POA: Insufficient documentation

## 2022-02-16 DIAGNOSIS — Z79899 Other long term (current) drug therapy: Secondary | ICD-10-CM | POA: Diagnosis not present

## 2022-02-16 DIAGNOSIS — I471 Supraventricular tachycardia: Secondary | ICD-10-CM | POA: Diagnosis not present

## 2022-02-16 DIAGNOSIS — R0683 Snoring: Secondary | ICD-10-CM | POA: Diagnosis not present

## 2022-02-16 DIAGNOSIS — E785 Hyperlipidemia, unspecified: Secondary | ICD-10-CM | POA: Diagnosis not present

## 2022-02-16 DIAGNOSIS — N179 Acute kidney failure, unspecified: Secondary | ICD-10-CM | POA: Diagnosis not present

## 2022-02-16 DIAGNOSIS — G4733 Obstructive sleep apnea (adult) (pediatric): Secondary | ICD-10-CM | POA: Diagnosis not present

## 2022-02-16 DIAGNOSIS — R918 Other nonspecific abnormal finding of lung field: Secondary | ICD-10-CM

## 2022-02-22 ENCOUNTER — Encounter: Payer: Self-pay | Admitting: Internal Medicine

## 2022-02-23 ENCOUNTER — Encounter (HOSPITAL_BASED_OUTPATIENT_CLINIC_OR_DEPARTMENT_OTHER): Payer: Self-pay | Admitting: Cardiovascular Disease

## 2022-02-23 ENCOUNTER — Telehealth: Payer: Self-pay | Admitting: *Deleted

## 2022-02-23 ENCOUNTER — Other Ambulatory Visit: Payer: Self-pay | Admitting: Cardiovascular Disease

## 2022-02-23 DIAGNOSIS — G4733 Obstructive sleep apnea (adult) (pediatric): Secondary | ICD-10-CM

## 2022-02-23 DIAGNOSIS — G4736 Sleep related hypoventilation in conditions classified elsewhere: Secondary | ICD-10-CM

## 2022-02-23 NOTE — Telephone Encounter (Signed)
-----   Message from Lennette Bihari, MD sent at 02/23/2022 10:54 AM EDT ----- ?Burna Mortimer, please notify pt and set up for Auto-PAP or titration study. ?

## 2022-02-23 NOTE — Telephone Encounter (Signed)
Left message for patient to return a call to discuss sleep study results and recommendations. 

## 2022-02-23 NOTE — Procedures (Signed)
? ? ? ?  Patient Name: Christian Ramos, Fanguy ?Study Date: 02/17/2022 ?Gender: Male ?D.O.B: Mar 15, 1961 ?Age (years): 42 ?Referring Provider: Marjie Skiff PA-C ?Height (inches): 69 ?Interpreting Physician: Nicki Guadalajara MD, ABSM ?Weight (lbs): 249 ?RPSGT: [place], [place] ?BMI: 37 ?MRN: [id number] ?Neck Size: 17.00 ? ?CLINICAL INFORMATION ?Sleep Study Type: HST ? ?Indication for sleep study: Snoring, obesity, SVT, HTN ? ?Epworth Sleepiness Score: 2 ? ?SLEEP STUDY TECHNIQUE ?A multi-channel overnight portable sleep study was performed. The channels recorded were: nasal airflow, thoracic respiratory movement, and oxygen saturation with a pulse oximetry. Snoring was also monitored. ? ?MEDICATIONS ?albuterol (VENTOLIN HFA) 108 (90 Base) MCG/ACT inhaler ?eletriptan (RELPAX) 20 MG tablet ?gabapentin (NEURONTIN) 100 MG capsule ?losartan-hydrochlorothiazide (HYZAAR) 100-25 MG tablet ?metoprolol succinate (TOPROL-XL) 50 MG 24 hr tablet ?Multiple Vitamin (MULTIVITAMIN WITH MINERALS) TABS tablet ?Semaglutide-Weight Management (WEGOVY) 0.25 MG/0.5ML SOAJ  ?Patient self administered medications include: N/A. ? ?SLEEP ARCHITECTURE ?Patient was studied for 371 minutes. The sleep efficiency was 100.0 % and the patient was supine for 39.7%. The arousal index was 0.0 per hour. ? ?RESPIRATORY PARAMETERS ?The overall AHI was 20.2 per hour, with a central apnea index of 0 per hour.  There is a positional component with severe sleep apnea with supine sleep AHI (32.2./h) versus non-supine sleep AHI (12.3/h). ? ?The oxygen nadir was 86% during sleep. ? ?CARDIAC DATA ?Mean heart rate during sleep was 60.8 bpm. The slowest heart rate was 50 bpm. ? ?IMPRESSIONS ?- Moderate obstructive sleep apnea overall (AHI 20.2/h); however, sleep apnea was severe with supine sleep and the severity during REM sleep cannot be assessed on this study.  ?- Moderate oxygen desaturation to a nadir of 86%. ?- Patient snored for 217.8 minutes (58.7%) during the  sleep. ? ?DIAGNOSIS ?- Obstructive Sleep Apnea (G47.33) ? ?RECOMMENDATIONS ?- In this patient with cardiovascular comorbidities recommend therapeutic CPAP for treatment of his sleep disordered breathing. If unable for an in-lab titration, initiate a trial of Auto-PAP with EPR of 3 at 7 - 18 cm of water.  ?- Effort should be made to optimize nasal and oropharyngeal patency.  ?- Positional therapy avoiding supine position during sleep. ?- Avoid alcohol, sedatives and other CNS depressants that may worsen sleep apnea and disrupt normal sleep architecture. ?- Sleep hygiene should be reviewed to assess factors that may improve sleep quality. ?- Weight management ( BMI 37) and regular exercise should be initiated or continued. ?- Recommend a download and sleep clinic evaluation after one month of therapy.  ? ? ?[Electronically signed] 02/23/2022 10:48 AM ? ?Nicki Guadalajara MD, [facility], ABSM ?Diplomate, Biomedical engineer of Sleep Medicine ? ?NPI: [id number] ? ?[facility] ?PH: [phone]   FX: [phone] ?ACCREDITED BY THE [employer]ICAN AC[employer] OF SLEEP MEDICINE ? ?

## 2022-02-23 NOTE — Telephone Encounter (Signed)
Patient returned a call and was given sleep study results and recommendations. He agrees to proceed with MD recommendations. PA request for CPAP titration will be sent to [facility]. ?

## 2022-03-02 ENCOUNTER — Encounter: Payer: Self-pay | Admitting: Internal Medicine

## 2022-03-02 MED ORDER — WEGOVY 0.5 MG/0.5ML ~~LOC~~ SOAJ
0.5000 mg | SUBCUTANEOUS | 0 refills | Status: DC
Start: 2022-03-02 — End: 2022-04-09

## 2022-03-07 DIAGNOSIS — Z85828 Personal history of other malignant neoplasm of skin: Secondary | ICD-10-CM | POA: Diagnosis not present

## 2022-03-07 DIAGNOSIS — L578 Other skin changes due to chronic exposure to nonionizing radiation: Secondary | ICD-10-CM | POA: Diagnosis not present

## 2022-03-07 DIAGNOSIS — L821 Other seborrheic keratosis: Secondary | ICD-10-CM | POA: Diagnosis not present

## 2022-03-07 DIAGNOSIS — L570 Actinic keratosis: Secondary | ICD-10-CM | POA: Diagnosis not present

## 2022-03-30 ENCOUNTER — Ambulatory Visit: Payer: BC Managed Care – PPO | Admitting: Internal Medicine

## 2022-04-06 NOTE — Progress Notes (Unsigned)
NEUROLOGY FOLLOW UP OFFICE NOTE  CICEL PASIERB [id number]  Assessment/Plan:   Cervicogenic migraine Right sided occipital neuralgia   gabapentin 200mg  at bedtime Eletriptan 20mg  for abortive therapy. Limit use of pain relievers to no more than 2 days out of week to prevent risk of rebound or medication-overuse headache. Keep headache diary Follow up 9 months.     Subjective:  Christian Ramos is a 61 year old right-handed man hypertension who follows up for cervicogenic headache    UPDATE: Started gabapentin last visit. Intensity:  moderate Duration:  1 hour with eletriptan Frequency:  varies.  May go 2-3 weeks without one.  But may have 2-3 in one week.  (In last 4 weeks, has had 2 in past week).    He was in the hospital in March for SVT and elevated tropoinins.  Workup negative for CAD.  Current NSAIDS/analgesics:  none Current triptans:  eletriptan 20mg  Current ergotamine:  none Current anti-emetic:  none Current muscle relaxants:  tizanidine 4mg  PRN Current Antihypertensive medications:  Hyzaar Current Antidepressant medications:  none Current Anticonvulsant medications:  gabapentin 200mg  at bedtime Current anti-CGRP:  none Current Vitamins/Herbal/Supplements:  none Current Antihistamines/Decongestants:  Claritin, Astelin NS Other therapy:  none Hormone/birth control:  none  Caffeine:  no Alcohol:  3 to 5 drinks a week Smoker:  no Diet:  60 oz water Exercise:  yes Depression/stress:  no  HISTORY: In 2018, he reported onset of new headache 6-7/10 non-throbbing/pressure pain around 2016.  Starts at base of right side of head and radiates to right temporal-parietal region.  He calls it a pressure pain.  No numbness or tingling.  He denies pain radiating down the neck or into the arm.  When he moves his neck, he feels "crunching".  No aura.  Associated bilateral blurred vision, photophobia, phonophobia and a little lightheaded.  No nausea.  At that time, he  reported headaches lasted 12 hours and occurred 3 to 4 days a week.  No triggers or relieving factors.  Around the same time, he reported anosmia.  MRI of brain with and without contrast was performed on 09/26/16 to rule out mass lesion or other abnormality affecting the olfactory bulbs was personally reviewed and was normal.  Would treat with ibuprofen or Excedrin.  Tizanidine at night helped.  He was prescribed gabapentin but never started it as he was concerned about taking an anti-seizure medication.  He wished to avoid medications, so I referred him to Dr. Hulan Saas of [facility] for [facility].  He didn't see Dr. Tamala Julian but he reportedly received some kind of physical therapy which helped briefly.  He saw a spine surgeon who ordered X-rays as well as MRI which reportedly showed no structural cause for headaches.  I do not have these notes, images and patient does not remember who he saw.     He continued having headaches.  CT head on 06/20/2020 personally reviewed was normal.  By late 2022, they became frequent.  They would occur 10-20 days a month.  Sometimes he would be without headache for 5 days and then headaches would recur for several days.  He hasn't had a headache in 2 weeks.  He tried eletriptan, which aborts the headache quickly.  Prednisone taper was ineffective.      Past NSAIDS:  no Past analgesics:  no Past abortive triptans:  no Past muscle relaxants:  no Past anti-emetic:  no Past antihypertensive medications:  Prinzide, amlodipine, HCTZ Past antidepressant medications:  no Past  anticonvulsant medications:  no Past vitamins/Herbal/Supplements:  no Other past therapies:  no      No personal history of headache. Denies head injury prior to onset of symptoms  PAST MEDICAL HISTORY: Past Medical History:  Diagnosis Date   Anosmia    Hypertension    Obesity (BMI 30-39.9)    Vitreous hemorrhage ([place]) 09/2018   R    MEDICATIONS: Current Outpatient Medications on File  Prior to Visit  Medication Sig Dispense Refill   albuterol (VENTOLIN HFA) 108 (90 Base) MCG/ACT inhaler Inhale 2 puffs into the lungs every 6 (six) hours as needed for wheezing or shortness of breath. 18 g 5   eletriptan (RELPAX) 20 MG tablet Take 1 tablet (20 mg total) by mouth as needed for migraine or headache. May repeat in 2 hours if headache persists or recurs.  No more than 2 tablets in a 24-hour period 20 tablet 0   gabapentin (NEURONTIN) 100 MG capsule Take 1 capsule at bedtime for one week, then increase to 2 capsules at bedtime 60 capsule 5   losartan-hydrochlorothiazide (HYZAAR) 100-25 MG tablet TAKE 1 TABLET DAILY 90 tablet 1   metoprolol succinate (TOPROL-XL) 50 MG 24 hr tablet Take 1 tablet (50 mg total) by mouth daily. 90 tablet 3   Multiple Vitamin (MULTIVITAMIN WITH MINERALS) TABS tablet Take 1 tablet by mouth daily.     Semaglutide-Weight Management (WEGOVY) 0.5 MG/0.5ML SOAJ Inject 0.5 mg into the skin once a week. 2 mL 0   No current facility-administered medications on file prior to visit.    ALLERGIES: No Known Allergies  FAMILY HISTORY: Family History  Problem Relation Age of Onset   Breast cancer Mother        lumpectomy, XRT, chemo   Diabetes Mother    Hypertension Mother    Prostate cancer Father 78       ~ 67 y/o   Heart disease Father 39       MI    Colon cancer Neg Hx       Objective:  Blood pressure (!) 147/74, pulse 88, height 5\' 9"  (1.753 m), weight 248 lb (112.5 kg), SpO2 94 %. General: No acute distress.  Patient appears well-groomed.   Head:  Normocephalic/atraumatic Eyes:  Fundi examined but not visualized Neck: supple, no paraspinal tenderness, full range of motion Heart:  Regular rate and rhythm Neurological Exam: alert and oriented to person, place, and time.  Speech fluent and not dysarthric, language intact.  CN II-XII intact. Bulk and tone normal, muscle strength 5/5 throughout.  Sensation to light touch intact.  Deep tendon reflexes 2+  throughout, toes downgoing.  Finger to nose testing intact.  Gait normal, Romberg negative.   Metta Clines, DO  CC: Kathlene November, MD

## 2022-04-07 ENCOUNTER — Encounter: Payer: Self-pay | Admitting: Internal Medicine

## 2022-04-09 ENCOUNTER — Encounter: Payer: Self-pay | Admitting: Neurology

## 2022-04-09 ENCOUNTER — Ambulatory Visit (INDEPENDENT_AMBULATORY_CARE_PROVIDER_SITE_OTHER): Payer: BC Managed Care – PPO | Admitting: Neurology

## 2022-04-09 VITALS — BP 147/74 | HR 88 | Ht 69.0 in | Wt 248.0 lb

## 2022-04-09 DIAGNOSIS — M5481 Occipital neuralgia: Secondary | ICD-10-CM | POA: Diagnosis not present

## 2022-04-09 DIAGNOSIS — G43809 Other migraine, not intractable, without status migrainosus: Secondary | ICD-10-CM

## 2022-04-09 MED ORDER — WEGOVY 1 MG/0.5ML ~~LOC~~ SOAJ
1.0000 mg | SUBCUTANEOUS | 0 refills | Status: DC
Start: 2022-04-09 — End: 2022-04-17

## 2022-04-09 MED ORDER — ELETRIPTAN HYDROBROMIDE 20 MG PO TABS: 20.0000 mg | ORAL_TABLET | ORAL | 0 refills | Status: DC | PRN

## 2022-04-09 NOTE — Patient Instructions (Signed)
Continue gabapentin 200mg  at bedtime Refilled eletriptan Follow up in 9 months.

## 2022-04-17 MED ORDER — WEGOVY 1.7 MG/0.75ML ~~LOC~~ SOAJ: 1.7000 mg | SUBCUTANEOUS | 1 refills | Status: DC

## 2022-04-23 ENCOUNTER — Encounter: Payer: Self-pay | Admitting: Internal Medicine

## 2022-04-23 ENCOUNTER — Ambulatory Visit (INDEPENDENT_AMBULATORY_CARE_PROVIDER_SITE_OTHER): Payer: BC Managed Care – PPO | Admitting: Internal Medicine

## 2022-04-23 VITALS — BP 128/74 | HR 77 | Temp 98.1°F | Resp 16 | Ht 69.0 in | Wt 241.1 lb

## 2022-04-23 DIAGNOSIS — E669 Obesity, unspecified: Secondary | ICD-10-CM

## 2022-04-23 DIAGNOSIS — I10 Essential (primary) hypertension: Secondary | ICD-10-CM | POA: Diagnosis not present

## 2022-05-07 DIAGNOSIS — Z1211 Encounter for screening for malignant neoplasm of colon: Secondary | ICD-10-CM | POA: Diagnosis not present

## 2022-05-07 DIAGNOSIS — D125 Benign neoplasm of sigmoid colon: Secondary | ICD-10-CM | POA: Diagnosis not present

## 2022-05-07 DIAGNOSIS — Z86010 Personal history of colonic polyps: Secondary | ICD-10-CM | POA: Diagnosis not present

## 2022-05-07 DIAGNOSIS — K635 Polyp of colon: Secondary | ICD-10-CM | POA: Diagnosis not present

## 2022-05-07 DIAGNOSIS — K648 Other hemorrhoids: Secondary | ICD-10-CM | POA: Diagnosis not present

## 2022-05-07 DIAGNOSIS — D124 Benign neoplasm of descending colon: Secondary | ICD-10-CM | POA: Diagnosis not present

## 2022-05-07 LAB — HM COLONOSCOPY

## 2022-05-08 ENCOUNTER — Encounter: Payer: Self-pay | Admitting: Internal Medicine

## 2022-05-10 ENCOUNTER — Other Ambulatory Visit: Payer: Self-pay | Admitting: Internal Medicine

## 2022-05-30 ENCOUNTER — Other Ambulatory Visit: Payer: Self-pay | Admitting: Internal Medicine

## 2022-05-30 MED ORDER — WEGOVY 2.4 MG/0.75ML ~~LOC~~ SOAJ: 2.4000 mg | SUBCUTANEOUS | 3 refills | Status: DC

## 2022-06-07 ENCOUNTER — Other Ambulatory Visit: Payer: Self-pay | Admitting: Student

## 2022-06-25 ENCOUNTER — Ambulatory Visit: Payer: BC Managed Care – PPO | Attending: Cardiovascular Disease | Admitting: Cardiovascular Disease

## 2022-06-25 ENCOUNTER — Encounter: Payer: Self-pay | Admitting: Cardiovascular Disease

## 2022-06-25 VITALS — BP 134/90 | HR 60 | Ht 68.0 in | Wt 228.0 lb

## 2022-06-25 DIAGNOSIS — I10 Essential (primary) hypertension: Secondary | ICD-10-CM

## 2022-06-26 ENCOUNTER — Telehealth: Payer: Self-pay

## 2022-06-26 NOTE — Telephone Encounter (Signed)
PA initiated via Covermymeds; KEY: [id number].  PA approved.    [id number];GNFAOZ:[id number];Review Type:Prior Auth;Coverage Start Date:05/27/2022;Coverage End Date:01/22/2023;

## 2022-08-08 ENCOUNTER — Other Ambulatory Visit: Payer: Self-pay | Admitting: Neurology

## 2022-08-09 ENCOUNTER — Other Ambulatory Visit: Payer: Self-pay | Admitting: Neurology

## 2022-08-09 MED ORDER — ELETRIPTAN HYDROBROMIDE 20 MG PO TABS: 20.0000 mg | ORAL_TABLET | ORAL | 0 refills | Status: DC | PRN

## 2022-08-15 ENCOUNTER — Ambulatory Visit (INDEPENDENT_AMBULATORY_CARE_PROVIDER_SITE_OTHER): Payer: BC Managed Care – PPO | Admitting: Internal Medicine

## 2022-08-15 ENCOUNTER — Encounter: Payer: Self-pay | Admitting: Internal Medicine

## 2022-08-15 VITALS — BP 122/80 | HR 71 | Temp 98.3°F | Resp 16 | Ht 68.0 in | Wt 229.1 lb

## 2022-08-15 DIAGNOSIS — Z125 Encounter for screening for malignant neoplasm of prostate: Secondary | ICD-10-CM | POA: Diagnosis not present

## 2022-08-15 DIAGNOSIS — E785 Hyperlipidemia, unspecified: Secondary | ICD-10-CM

## 2022-08-15 DIAGNOSIS — E669 Obesity, unspecified: Secondary | ICD-10-CM

## 2022-08-15 DIAGNOSIS — I10 Essential (primary) hypertension: Secondary | ICD-10-CM | POA: Diagnosis not present

## 2022-08-15 DIAGNOSIS — R739 Hyperglycemia, unspecified: Secondary | ICD-10-CM | POA: Diagnosis not present

## 2022-08-15 DIAGNOSIS — N529 Male erectile dysfunction, unspecified: Secondary | ICD-10-CM | POA: Insufficient documentation

## 2022-08-15 LAB — CBC WITH DIFFERENTIAL/PLATELET
Basophils Absolute: 0.1 10*3/uL (ref 0.0–0.1)
Basophils Relative: 1.1 % (ref 0.0–3.0)
Eosinophils Absolute: 0.1 10*3/uL (ref 0.0–0.7)
Eosinophils Relative: 1.5 % (ref 0.0–5.0)
HCT: 45.8 % (ref 39.0–52.0)
Hemoglobin: 15.8 g/dL (ref 13.0–17.0)
Lymphocytes Relative: 26.5 % (ref 12.0–46.0)
Lymphs Abs: 2 10*3/uL (ref 0.7–4.0)
MCHC: 34.6 g/dL (ref 30.0–36.0)
MCV: 92 fl (ref 78.0–100.0)
Monocytes Absolute: 0.6 10*3/uL (ref 0.1–1.0)
Monocytes Relative: 8.3 % (ref 3.0–12.0)
Neutro Abs: 4.7 10*3/uL (ref 1.4–7.7)
Neutrophils Relative %: 62.6 % (ref 43.0–77.0)
Platelets: 307 10*3/uL (ref 150.0–400.0)
RBC: 4.98 Mil/uL (ref 4.22–5.81)
RDW: 12.7 % (ref 11.5–15.5)
WBC: 7.5 10*3/uL (ref 4.0–10.5)

## 2022-08-15 LAB — LIPID PANEL
Cholesterol: 209 mg/dL — ABNORMAL HIGH (ref 0–200)
HDL: 48.2 mg/dL (ref >39.00)
LDL Cholesterol: 129 mg/dL — ABNORMAL HIGH (ref 0–99)
NonHDL: 161.01
Total CHOL/HDL Ratio: 4
Triglycerides: 162 mg/dL — ABNORMAL HIGH (ref 0.0–149.0)
VLDL: 32.4 mg/dL (ref 0.0–40.0)

## 2022-08-15 LAB — HEMOGLOBIN A1C: Hgb A1c MFr Bld: 5.4 % (ref 4.6–6.5)

## 2022-08-15 LAB — COMPREHENSIVE METABOLIC PANEL
ALT: 19 U/L (ref 0–53)
AST: 17 U/L (ref 0–37)
Albumin: 4.3 g/dL (ref 3.5–5.2)
Alkaline Phosphatase: 56 U/L (ref 39–117)
BUN: 15 mg/dL (ref 6–23)
CO2: 30 mEq/L (ref 19–32)
Calcium: 9.7 mg/dL (ref 8.4–10.5)
Chloride: 101 mEq/L (ref 96–112)
Creatinine, Ser: 1.02 mg/dL (ref 0.40–1.50)
GFR: 79.74 mL/min (ref >60.00)
Glucose, Bld: 92 mg/dL (ref 70–99)
Potassium: 4.6 mEq/L (ref 3.5–5.1)
Sodium: 138 mEq/L (ref 135–145)
Total Bilirubin: 0.9 mg/dL (ref 0.2–1.2)
Total Protein: 7.1 g/dL (ref 6.0–8.3)

## 2022-08-15 LAB — TSH: TSH: 0.72 u[IU]/mL (ref 0.35–5.50)

## 2022-08-15 LAB — PSA: PSA: 0.4 ng/mL (ref 0.10–4.00)

## 2022-08-15 MED ORDER — WEGOVY 2.4 MG/0.75ML ~~LOC~~ SOAJ: 2.4000 mg | SUBCUTANEOUS | 6 refills | Status: DC

## 2022-08-15 MED ORDER — SILDENAFIL CITRATE 20 MG PO TABS: 60.0000 mg | ORAL_TABLET | Freq: Every evening | ORAL | 3 refills | Status: DC | PRN

## 2022-08-15 MED ORDER — LOSARTAN POTASSIUM-HCTZ 100-25 MG PO TABS: 1.0000 | ORAL_TABLET | Freq: Every day | ORAL | 1 refills | Status: DC

## 2022-08-15 NOTE — Assessment & Plan Note (Signed)
Obesity: Doing great with Wegovy, diet is healthy, walks regularly with his wife.  No apparent problems or side effects.  RF meds. Praised for his efforts.  Check TSH HTN: On metoprolol, Hyzaar, ambulatory BPs are great, recommend to continue watching, BP may start to drop due to weight loss, medications adjustment may be needed. OSA: I see that he was recommended CPAP titration but that never happened , since he lost weight, he has no symptoms, snoring is gone, energy is better, does not feel sleepy.  Recommend observation for now Mild hyperglycemia: Check A1c Request multiple labs, requested by his insurance, check PSA for screening ED: New issue, at the end of the visit reported difficulties with the quality of erections, treatment?  Sildenafil is an option, may cause headaches, palpitations, runny nose.  Has a h/o  migraines.  He is willing to try.  How to use sildenafil and what to expect discussed with the patient.  Prescription printed. Vaccine advice provided RTC 4 m CPX 

## 2022-08-15 NOTE — Progress Notes (Signed)
Subjective:    Patient ID: Christian Ramos, male    DOB: 02/01/1961, 61 y.o.   MRN: [id number]  DOS:  08/15/2022 Type of visit - description: f/u  Today we talk about his chronic medical problems. Doing well on Wegovy, diet is healthy, continue to lose weight.  Taking walks frequently. Ambulatory BPs in the 115. Had a sleep study, Dx with OSA.  Currently asymptomatic    Wt Readings from Last 3 Encounters:  08/15/22 229 lb 2 oz (103.9 kg)  06/25/22 228 lb (103.4 kg)  04/23/22 241 lb 2 oz (109.4 kg)     Review of Systems No chest pain or difficulty breathing No lower extremity edema No nausea vomiting  Past Medical History:  Diagnosis Date   Anosmia    Hypertension    Obesity (BMI 30-39.9)    Vitreous hemorrhage ([place]) 09/2018   R    Past Surgical History:  Procedure Laterality Date   NECK SURGERY  ~ 2000   mass removed anteriorly, Dr Warren Danes   VASECTOMY  ~2000    Current Outpatient Medications  Medication Instructions   albuterol (VENTOLIN HFA) 108 (90 Base) MCG/ACT inhaler 2 puffs, Inhalation, Every 6 hours PRN   eletriptan (RELPAX) 20 mg, Oral, As needed, May repeat in 2 hours if headache persists or recurs.  No more than 2 tablets in a 24-hour period   gabapentin (NEURONTIN) 100 MG capsule Take 1 capsule at bedtime for one week, then increase to 2 capsules at bedtime   losartan-hydrochlorothiazide (HYZAAR) 100-25 MG tablet TAKE 1 TABLET DAILY   metoprolol succinate (TOPROL-XL) 50 mg, Oral, Daily   Multiple Vitamin (MULTIVITAMIN WITH MINERALS) TABS tablet 1 tablet, Oral, Daily   Wegovy 2.4 mg, Subcutaneous, Weekly       Objective:   Physical Exam BP 122/80   Pulse 71   Temp 98.3 F (36.8 C) (Oral)   Resp 16   Ht 5\' 8"  (1.727 m)   Wt 229 lb 2 oz (103.9 kg)   SpO2 97%   BMI 34.84 kg/m  General:   Well developed, NAD, BMI noted. HEENT:  Normocephalic . Face symmetric, atraumatic Lungs:  CTA B Normal respiratory effort, no intercostal retractions,  no accessory muscle use. Heart: RRR,  no murmur.  Lower extremities: no pretibial edema bilaterally  Skin: Not pale. Not jaundice Neurologic:  alert & oriented X3.  Speech normal, gait appropriate for age and unassisted Psych--  Cognition and judgment appear intact.  Cooperative with normal attention span and concentration.  Behavior appropriate. No anxious or depressed appearing.      Assessment     Assessment  (transfer from [facility] 04/17/2016) HTN  Obesity Exercise  induced asthma OSA  HA-anosmia: saw neuro, last OV 11-2016; MRI (-), likely cervicogenic HA, pt declined Gabapentin, RX muscle relaxants, PT R Vitreous hemorrhage 09/2018 SVT Dx 12-2021.   PLAN Obesity: Doing great with Wegovy, diet is healthy, walks regularly with his wife.  No apparent problems or side effects.  RF meds. Praised for his efforts.  Check TSH HTN: On metoprolol, Hyzaar, ambulatory BPs are great, recommend to continue watching, BP may start to drop due to weight loss, medications adjustment may be needed. OSA: I see that he was recommended CPAP titration but that never happened , since he lost weight, he has no symptoms, snoring is gone, energy is better, does not feel sleepy.  Recommend observation for now Mild hyperglycemia: Check A1c Request multiple labs, requested by his insurance, check PSA for screening  ED: New issue, at the end of the visit reported difficulties with the quality of erections, treatment?  Sildenafil is an option, may cause headaches, palpitations, runny nose.  Has a h/o  migraines.  He is willing to try.  How to use sildenafil and what to expect discussed with the patient.  Prescription printed. Vaccine advice provided RTC 4 m CPX

## 2022-08-15 NOTE — Assessment & Plan Note (Deleted)
Obesity: Doing great with Wegovy, diet is healthy, walks regularly with his wife.  No apparent problems or side effects.  RF meds. Praised for his efforts.  Check TSH HTN: On metoprolol, Hyzaar, ambulatory BPs are great, recommend to continue watching, BP may start to drop due to weight loss, medications adjustment may be needed. OSA: I see that he was recommended CPAP titration but that never happened , since he lost weight, he has no symptoms, snoring is gone, energy is better, does not feel sleepy.  Recommend observation for now Mild hyperglycemia: Check A1c Request multiple labs, requested by his insurance, check PSA for screening ED: New issue, at the end of the visit reported difficulties with the quality of erections, treatment?  Sildenafil is an option, may cause headaches, palpitations, runny nose.  Has a h/o  migraines.  He is willing to try.  How to use sildenafil and what to expect discussed with the patient.  Prescription printed. Vaccine advice provided RTC 4 m CPX

## 2022-08-15 NOTE — Patient Instructions (Addendum)
Vaccines I recommend:  Shingrix (shingles) Covid booster Flu shot  Tdap (tetanus)  Continue watching your diet carefully and taking walks frequently    GO TO THE LAB : Get the blood work     GO TO THE FRONT DESK, PLEASE SCHEDULE YOUR APPOINTMENTS Come back for a physical exam in 4 months  Check the  blood pressure regularly BP GOAL is between 110/65 and  135/85. If it is consistently higher or lower, let me know, we may need to adjust your medication

## 2022-08-21 ENCOUNTER — Other Ambulatory Visit: Payer: Self-pay | Admitting: Internal Medicine

## 2022-08-21 ENCOUNTER — Encounter: Payer: Self-pay | Admitting: Internal Medicine

## 2022-08-21 MED ORDER — LOSARTAN POTASSIUM-HCTZ 100-25 MG PO TABS: 1.0000 | ORAL_TABLET | Freq: Every day | ORAL | 0 refills | Status: DC

## 2022-08-22 IMAGING — DX DG CHEST 1V PORT
1 series · 1 of 1 positions shown · non-contrast
Comparison: None.

CLINICAL DATA: Chest pain and tightness

EXAM:
PORTABLE CHEST 1 VIEW

[chest ap]
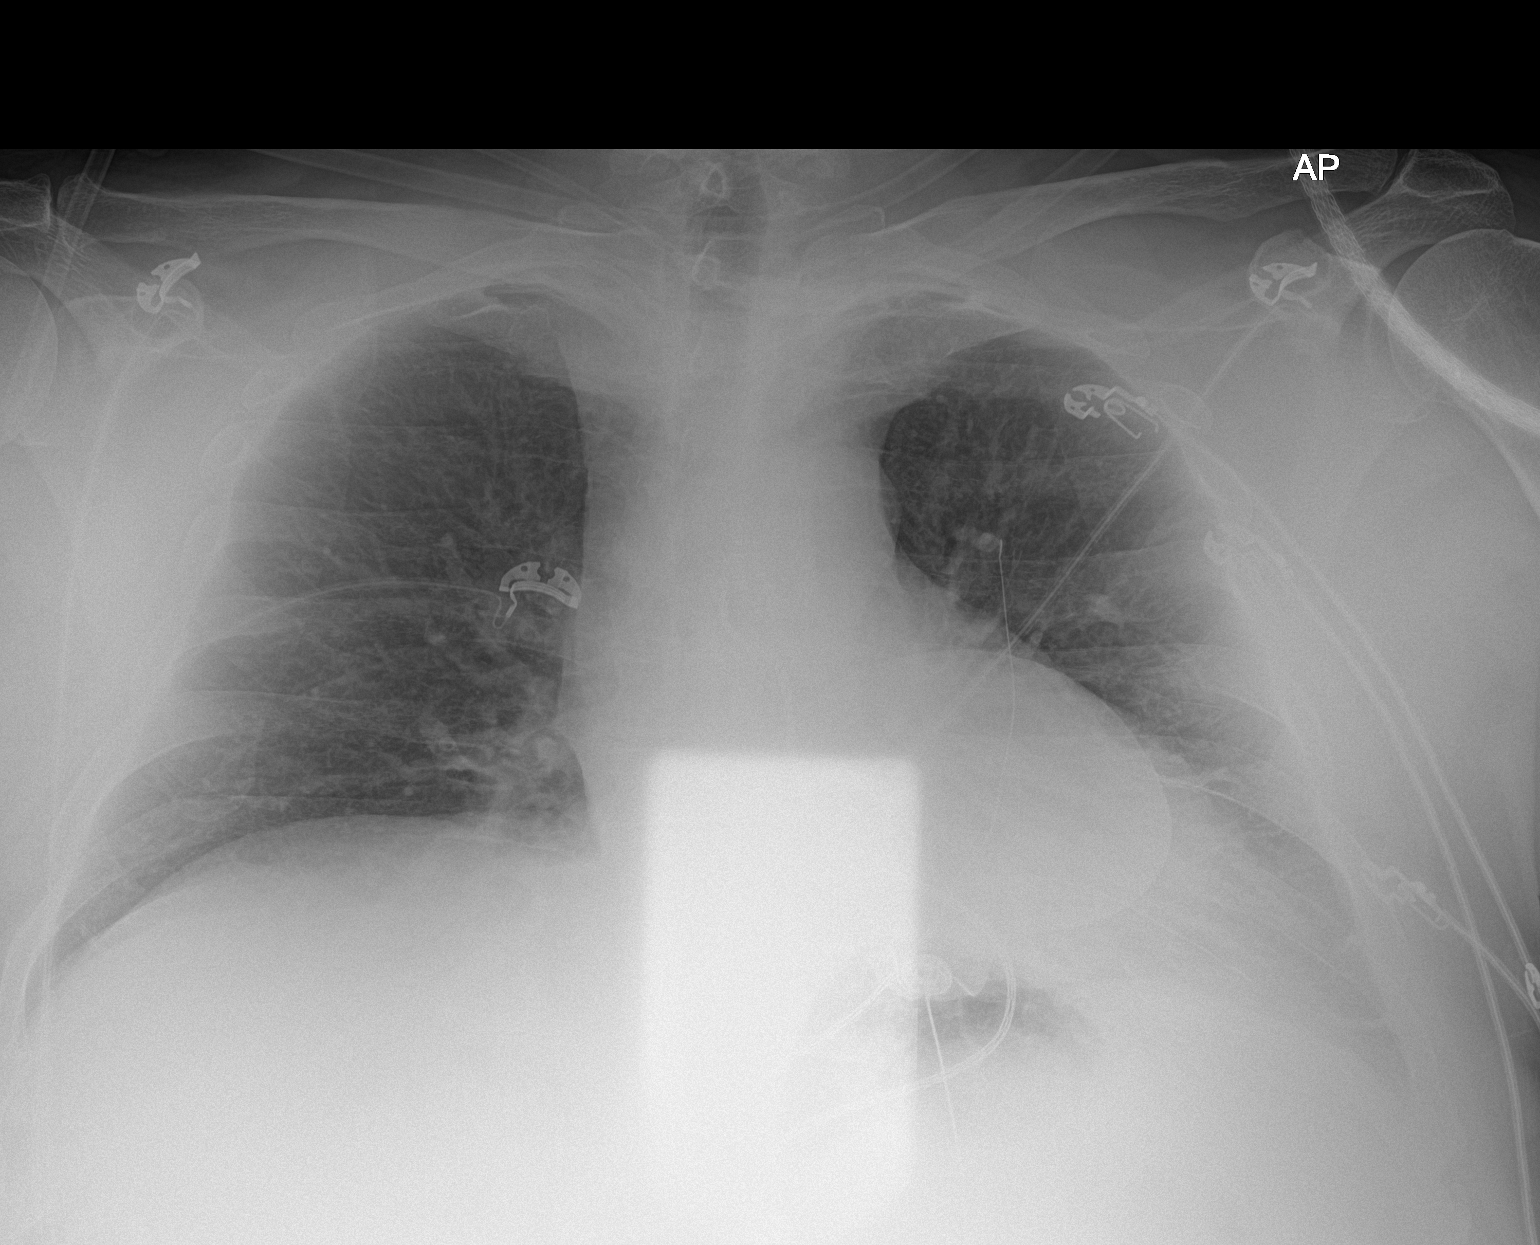

[1 of 1 positions shown; findings below may reference images not displayed]

FINDINGS: The heart size and mediastinal contours are within normal limits.
Both lungs are clear. The visualized skeletal structures are
unremarkable.
IMPRESSION: No active disease.

## 2022-08-22 MED ORDER — WEGOVY 2.4 MG/0.75ML ~~LOC~~ SOAJ: 2.4000 mg | SUBCUTANEOUS | 6 refills | Status: DC

## 2022-08-23 IMAGING — CT CT HEART MORP W/ CTA COR W/ SCORE W/ CA W/CM &/OR W/O CM
4 of 7 series · 8 of 20 positions shown, 9 images · IV contrast (APPLIED)
Comparison: None.
COMPARISON: None.

Addendum:
EXAM:
OVER-READ INTERPRETATION  CT CHEST

The following report is an over-read performed by radiologist Dr.
Han Ho Balognapo [REDACTED] on 12/29/2021. This over-read
does not include interpretation of cardiac or coronary anatomy or
pathology. The coronary calcium score/coronary CTA interpretation by
the cardiologist is attached.
CLINICAL DATA: 61M with hypertension, obesity, SVT, and chest
tightness.
Cardiac/Coronary  CT
TECHNIQUE: The patient was scanned on a [employer] Force scanner.

[Series 6: ts diast sharp · axial · 0.41mm/px · z∈[+36,+68]mm · 2 of 248 slices shown]
[im 83/248  lung]
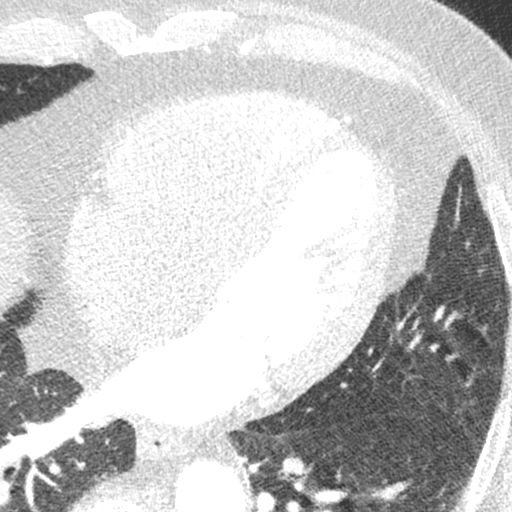
[im 165/248  lung]
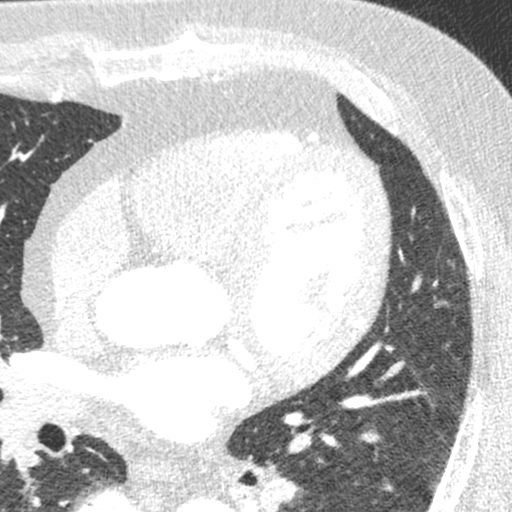

[Series 7: ts syst sharp · axial · 0.41mm/px · z∈[+36,+68]mm · 2 of 248 slices shown]
[im 83/248  lung]
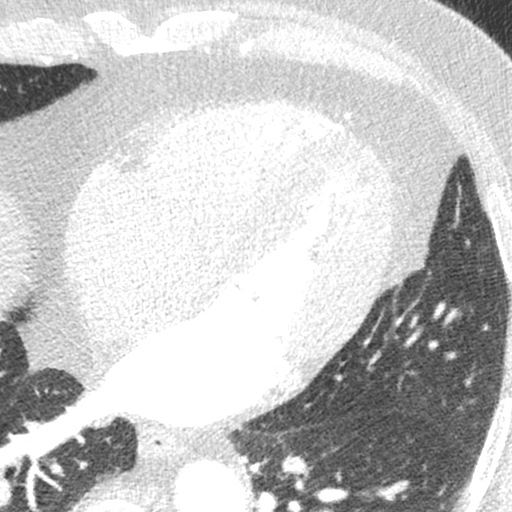
[im 165/248  lung]
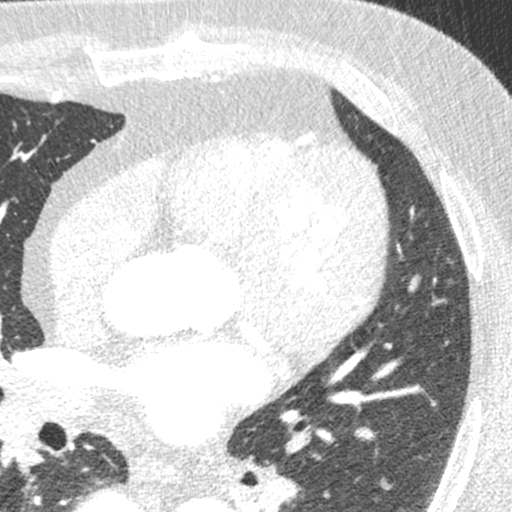

[Series 8: best syst · axial · 0.41mm/px · z∈[+36,+68]mm · 2 of 248 slices shown, 3 images]
[im 83/248  vessel]
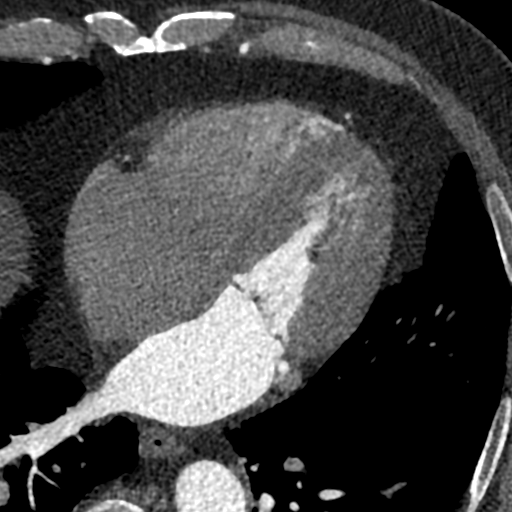
[im 83/248  lung]
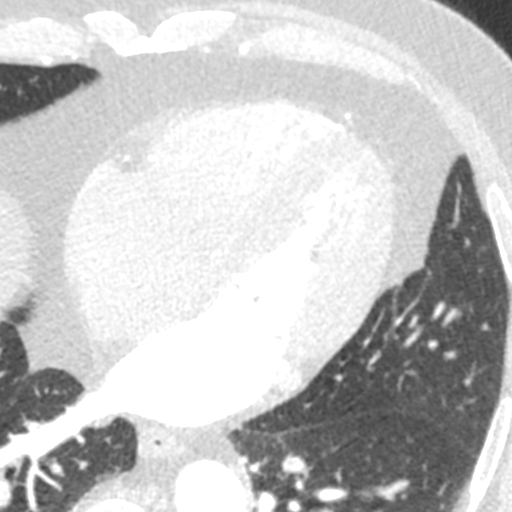
[im 165/248  vessel]
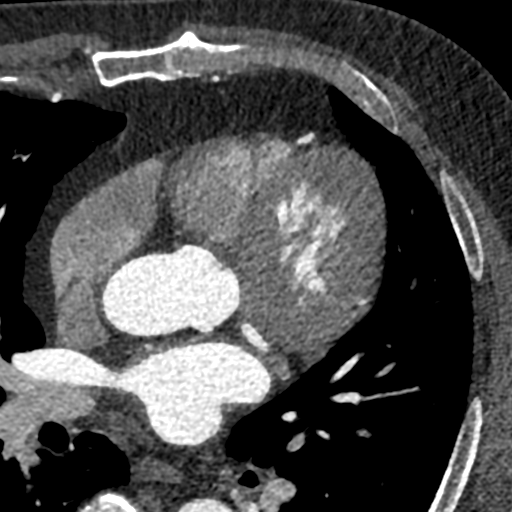

[Series 9: best diast · axial · 0.41mm/px · z∈[+36,+68]mm · 2 of 248 slices shown]
[im 83/248  vessel]
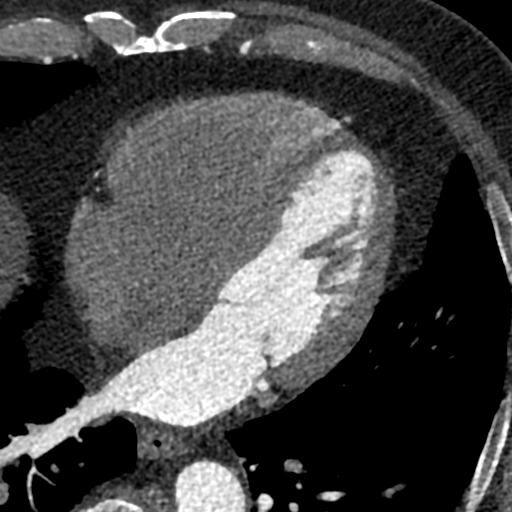
[im 165/248  vessel]
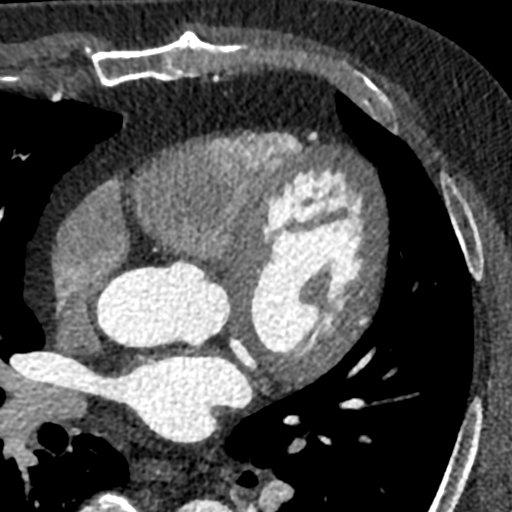

[8 of 20 positions shown; findings below may reference images not displayed]

FINDINGS: Vascular: Normal caliber of the visualized thoracic aorta.

Mediastinum/Nodes: Visualized mediastinal structures are normal.

Lungs/Pleura: 4 mm nodule in the periphery of the right middle lobe
is likely calcified and probably represents a calcified granuloma.
Additional punctate nodular densities near the right hemidiaphragm
on sequence 11 image 28 which may be calcified but indeterminate.
Additional 4 mm nodule which is probably calcified in the right lung
on sequence 11, image 6. Additional small nodular densities in left
lung, some of which may be calcified. No pleural effusions. No large
areas of lung consolidation.

Upper Abdomen: No acute abnormality.

Musculoskeletal: No acute abnormality.
IMPRESSION: 1. No acute extracardiac findings.
2. Multiple small nodules scattered throughout the visualized lungs,
largest measures 4 mm. Some of these nodules are compatible with
calcified granulomas but others are indeterminate. No follow-up
needed if patient is low-risk (and has no known or suspected primary
neoplasm). Non-contrast chest CT can be considered in 12 months if
patient is high-risk. This recommendation follows the consensus
statement: Guidelines for Management of Incidental Pulmonary Nodules
Detected on CT Images: From the [HOSPITAL] 2714; Radiology
FINDINGS: A 120 kV prospective scan was triggered in the descending thoracic
aorta at 111 HU's. Axial non-contrast 3 mm slices were carried out
through the heart. The data set was analyzed on a dedicated work
station and scored using the Agatson method. Gantry rotation speed
was 250 msecs and collimation was .6 mm. No beta blockade and 0.8 mg
of sl NTG was given. The 3D data set was reconstructed in 5%
intervals of the 67-82 % of the R-R cycle. Diastolic phases were
analyzed on a dedicated work station using MPR, MIP and VRT modes.
The patient received 80 cc of contrast.

Aorta: Normal size.  No calcifications.  No dissection.

Aortic Valve:  Trileaflet.  Mild calcification.

Coronary Arteries:  Normal coronary origin.  Right dominance.

RCA is a non-dominant artery that gives rise to PDA and PLVB. There
is no plaque.

Left main is a large artery that gives rise to LAD, RI and LCX
arteries.

LAD is a large vessel that has no plaque.

LCX is a large dominant artery that gives rise to OM and L-PDA.
There is no plaque.

Coronary Calcium Score: 0

Percentile: 0

Other findings:

Normal pulmonary vein drainage into the left atrium.

Normal let atrial appendage without a thrombus.

Normal size of the pulmonary artery.
IMPRESSION: 1. Coronary calcium score of 0. This was 0 percentile for age-,
race-, and sex-matched controls.

2. Normal coronary origin with right dominance.

3. No evidence of CAD.  CAD-RADS 0.

*** End of Addendum ***
EXAM:
OVER-READ INTERPRETATION  CT CHEST

The following report is an over-read performed by radiologist Dr.
Han Ho Balognapo [REDACTED] on 12/29/2021. This over-read
does not include interpretation of cardiac or coronary anatomy or
pathology. The coronary calcium score/coronary CTA interpretation by
the cardiologist is attached.
FINDINGS: Vascular: Normal caliber of the visualized thoracic aorta.

Mediastinum/Nodes: Visualized mediastinal structures are normal.

Lungs/Pleura: 4 mm nodule in the periphery of the right middle lobe
is likely calcified and probably represents a calcified granuloma.
Additional punctate nodular densities near the right hemidiaphragm
on sequence 11 image 28 which may be calcified but indeterminate.
Additional 4 mm nodule which is probably calcified in the right lung
on sequence 11, image 6. Additional small nodular densities in left
lung, some of which may be calcified. No pleural effusions. No large
areas of lung consolidation.

Upper Abdomen: No acute abnormality.

Musculoskeletal: No acute abnormality.
IMPRESSION: 1. No acute extracardiac findings.
2. Multiple small nodules scattered throughout the visualized lungs,
largest measures 4 mm. Some of these nodules are compatible with
calcified granulomas but others are indeterminate. No follow-up
needed if patient is low-risk (and has no known or suspected primary
neoplasm). Non-contrast chest CT can be considered in 12 months if
patient is high-risk. This recommendation follows the consensus
statement: Guidelines for Management of Incidental Pulmonary Nodules
Detected on CT Images: From the [HOSPITAL] 2714; Radiology

## 2022-09-04 DIAGNOSIS — L570 Actinic keratosis: Secondary | ICD-10-CM | POA: Diagnosis not present

## 2022-09-12 DIAGNOSIS — L570 Actinic keratosis: Secondary | ICD-10-CM | POA: Diagnosis not present

## 2022-09-12 DIAGNOSIS — L578 Other skin changes due to chronic exposure to nonionizing radiation: Secondary | ICD-10-CM | POA: Diagnosis not present

## 2022-09-12 DIAGNOSIS — B354 Tinea corporis: Secondary | ICD-10-CM | POA: Diagnosis not present

## 2022-09-12 DIAGNOSIS — L821 Other seborrheic keratosis: Secondary | ICD-10-CM | POA: Diagnosis not present

## 2022-09-12 DIAGNOSIS — Z85828 Personal history of other malignant neoplasm of skin: Secondary | ICD-10-CM | POA: Diagnosis not present

## 2022-09-26 ENCOUNTER — Other Ambulatory Visit: Payer: Self-pay | Admitting: Internal Medicine

## 2022-09-26 MED ORDER — LOSARTAN POTASSIUM-HCTZ 100-25 MG PO TABS: 1.0000 | ORAL_TABLET | Freq: Every day | ORAL | 0 refills | Status: DC

## 2022-10-01 ENCOUNTER — Encounter: Payer: Self-pay | Admitting: Internal Medicine

## 2022-10-01 MED ORDER — LOSARTAN POTASSIUM-HCTZ 100-25 MG PO TABS: 1.0000 | ORAL_TABLET | Freq: Every day | ORAL | 1 refills | Status: DC

## 2022-10-06 ENCOUNTER — Telehealth: Payer: BC Managed Care – PPO | Admitting: Nurse Practitioner

## 2022-10-06 DIAGNOSIS — H109 Unspecified conjunctivitis: Secondary | ICD-10-CM

## 2022-10-06 MED ORDER — POLYMYXIN B-TRIMETHOPRIM 10000-0.1 UNIT/ML-% OP SOLN: 1.0000 [drp] | Freq: Four times a day (QID) | OPHTHALMIC | 0 refills | Status: DC

## 2022-10-06 NOTE — Progress Notes (Signed)
I have spent 5 minutes in review of e-visit questionnaire, review and updating patient chart, medical decision making and response to patient.  ° °Jadzia Ibsen W Marsela Kuan, NP ° °  °

## 2022-10-06 NOTE — Progress Notes (Signed)

## 2022-10-12 ENCOUNTER — Other Ambulatory Visit: Payer: Self-pay

## 2022-10-12 MED ORDER — METOPROLOL SUCCINATE ER 50 MG PO TB24: 50.0000 mg | ORAL_TABLET | Freq: Every day | ORAL | 3 refills | Status: DC

## 2022-10-12 MED ORDER — WEGOVY 2.4 MG/0.75ML ~~LOC~~ SOAJ: 2.4000 mg | SUBCUTANEOUS | 1 refills | Status: DC

## 2022-10-15 ENCOUNTER — Encounter: Payer: Self-pay | Admitting: Internal Medicine

## 2022-10-15 MED ORDER — ALBUTEROL SULFATE HFA 108 (90 BASE) MCG/ACT IN AERS: 2.0000 | INHALATION_SPRAY | Freq: Four times a day (QID) | RESPIRATORY_TRACT | 1 refills | Status: DC | PRN

## 2022-10-15 MED ORDER — ELETRIPTAN HYDROBROMIDE 20 MG PO TABS: 20.0000 mg | ORAL_TABLET | ORAL | 1 refills | Status: DC | PRN

## 2022-10-17 ENCOUNTER — Encounter: Payer: Self-pay | Admitting: Internal Medicine

## 2022-10-18 MED ORDER — WEGOVY 2.4 MG/0.75ML ~~LOC~~ SOAJ: 2.4000 mg | SUBCUTANEOUS | 1 refills | Status: DC

## 2022-11-27 ENCOUNTER — Encounter: Payer: Self-pay | Admitting: Internal Medicine

## 2022-12-19 ENCOUNTER — Ambulatory Visit: Payer: BC Managed Care – PPO | Admitting: Internal Medicine

## 2022-12-19 ENCOUNTER — Encounter: Payer: Self-pay | Admitting: Internal Medicine

## 2022-12-19 VITALS — BP 136/76 | HR 57 | Temp 97.7°F | Resp 18 | Ht 68.0 in | Wt 237.5 lb

## 2022-12-19 DIAGNOSIS — M5416 Radiculopathy, lumbar region: Secondary | ICD-10-CM | POA: Diagnosis not present

## 2022-12-19 DIAGNOSIS — E669 Obesity, unspecified: Secondary | ICD-10-CM | POA: Diagnosis not present

## 2022-12-19 DIAGNOSIS — I10 Essential (primary) hypertension: Secondary | ICD-10-CM

## 2022-12-19 DIAGNOSIS — M25551 Pain in right hip: Secondary | ICD-10-CM | POA: Diagnosis not present

## 2022-12-19 MED ORDER — PREDNISONE 10 MG PO TABS: ORAL_TABLET | ORAL | 0 refills | Status: DC

## 2022-12-19 NOTE — Assessment & Plan Note (Signed)
Radiculopathy? 1 month history of pain at the right hip associated with numbness of the right leg suspicious for radiculopathy. Neurological exam however is normal. He has been taking naproxen twice daily for a while. Plan: Round of prednisone, take more Tylenol than naproxen.  See AVS. Refer to Ortho. HTN: Good med compliance, reports normal ambulatory BPs Obesity:  On Wegovy.  In our scale he has gained weight, he his home scales he reports he is losing weight. Reports his lifestyle has definitely improved. RTC: Has a CPX scheduled for next week but we agreed to reschedule for few weeks from now.

## 2022-12-19 NOTE — Patient Instructions (Addendum)
For pain control, I favor Tylenol over naproxen.  Tylenol  500 mg OTC 2 tabs a day every 8 hours as needed for pain  Over-the-counter naproxen every 12 hours as needed for pain.  Always take it with food because may cause gastritis and ulcers.  If you notice nausea, stomach pain, change in the color of stools --->  Stop the medicine and let us know  Will refer you to the orthopedic doctor  Reschedule your physical exam for few weeks from now.

## 2022-12-19 NOTE — Progress Notes (Signed)
Subjective:    Patient ID: Christian Ramos, male    DOB: 13-Dec-1960, 62 y.o.   MRN: [id number]  DOS:  12/19/2022 Type of visit - description: Acute  4 weeks ago developed pain located at the right hip, associated with numbness at the frontal aspect of the right leg up to the foot. The pain was not related to any fall or overdoing. He has been exercising and stretching regularly for months.  Denies any bladder or bowel incontinence. No fever or chills.   Wt Readings from Last 3 Encounters:  12/19/22 237 lb 8 oz (107.7 kg)  08/15/22 229 lb 2 oz (103.9 kg)  06/25/22 228 lb (103.4 kg)     Review of Systems See above   Past Medical History:  Diagnosis Date   Anosmia    Hypertension    Obesity (BMI 30-39.9)    Vitreous hemorrhage ([place]) 09/2018   R    Past Surgical History:  Procedure Laterality Date   NECK SURGERY  ~ 2000   mass removed anteriorly, Dr Warren Danes   VASECTOMY  ~2000    Current Outpatient Medications  Medication Instructions   albuterol (VENTOLIN HFA) 108 (90 Base) MCG/ACT inhaler 2 puffs, Inhalation, Every 6 hours PRN   eletriptan (RELPAX) 20 mg, Oral, As needed, May repeat in 2 hours if headache persists or recurs.  No more than 2 tablets in a 24-hour period   losartan-hydrochlorothiazide (HYZAAR) 100-25 MG tablet 1 tablet, Oral, Daily   metoprolol succinate (TOPROL-XL) 50 mg, Oral, Daily   Multiple Vitamin (MULTIVITAMIN WITH MINERALS) TABS tablet 1 tablet, Oral, Daily   predniSONE (DELTASONE) 10 MG tablet 4 tablets x 2 days, 3 tabs x 2 days, 2 tabs x 2 days, 1 tab x 2 days   sildenafil (REVATIO) 60-80 mg, Oral, At bedtime PRN   Wegovy 2.4 mg, Subcutaneous, Weekly       Objective:   Physical Exam BP 136/76   Pulse (!) 57   Temp 97.7 F (36.5 C) (Oral)   Resp 18   Ht 5' 8"$  (1.727 m)   Wt 237 lb 8 oz (107.7 kg)   SpO2 97%   BMI 36.11 kg/m  General:   Well developed, NAD, BMI noted. HEENT:  Normocephalic . Face symmetric, atraumatic MSK: No  TTP at the lumbar spine Lower extremities: no pretibial edema bilaterally  Skin: Not pale. Not jaundice Neurologic:  alert & oriented X3.  Speech normal, gait appropriate for age and unassisted. Motor and DTR symmetric.  Straight leg test negative. Psych--  Cognition and judgment appear intact.  Cooperative with normal attention span and concentration.  Behavior appropriate. No anxious or depressed appearing.      Assessment   Assessment  (transfer from [facility] 04/17/2016) HTN  Obesity Exercise  induced asthma OSA  HA-anosmia: saw neuro, last OV 11-2016; MRI (-), likely cervicogenic HA, pt declined Gabapentin, RX muscle relaxants, PT R Vitreous hemorrhage 09/2018 SVT Dx 12-2021.   PLAN Radiculopathy? 1 month history of pain at the right hip associated with numbness of the right leg suspicious for radiculopathy. Neurological exam however is normal. He has been taking naproxen twice daily for a while. Plan: Round of prednisone, take more Tylenol than naproxen.  See AVS. Refer to Ortho. HTN: Good med compliance, reports normal ambulatory BPs Obesity:  On Wegovy.  In our scale he has gained weight, he his home scales he reports he is losing weight. Reports his lifestyle has definitely improved. RTC: Has a CPX scheduled for  next week but we agreed to reschedule for few weeks from now.

## 2022-12-24 ENCOUNTER — Encounter: Payer: BC Managed Care – PPO | Admitting: Internal Medicine

## 2023-01-04 ENCOUNTER — Encounter: Payer: Self-pay | Admitting: Internal Medicine

## 2023-01-04 ENCOUNTER — Encounter: Payer: Self-pay | Admitting: Neurology

## 2023-01-04 ENCOUNTER — Ambulatory Visit: Payer: BC Managed Care – PPO | Admitting: Neurology

## 2023-01-04 VITALS — BP 121/80 | HR 72 | Ht 69.0 in | Wt 233.2 lb

## 2023-01-04 DIAGNOSIS — M5481 Occipital neuralgia: Secondary | ICD-10-CM

## 2023-01-04 DIAGNOSIS — G43009 Migraine without aura, not intractable, without status migrainosus: Secondary | ICD-10-CM | POA: Diagnosis not present

## 2023-01-04 MED ORDER — WEGOVY 2.4 MG/0.75ML ~~LOC~~ SOAJ: 2.4000 mg | SUBCUTANEOUS | 1 refills | Status: DC

## 2023-01-04 MED ORDER — ELETRIPTAN HYDROBROMIDE 20 MG PO TABS: 20.0000 mg | ORAL_TABLET | ORAL | 5 refills | Status: DC | PRN

## 2023-01-04 NOTE — Patient Instructions (Signed)
Refer to physical therapy for neck pain - cervicogenic headaches Eletriptan refilled.  Limit use of pain relievers to no more than 2 days out of week to prevent risk of rebound or medication-overuse headache. Follow up in one year or sooner if needed.  It physical therapy not helpful and you would like an occipital nerve block, contact me

## 2023-01-04 NOTE — Progress Notes (Signed)
NEUROLOGY FOLLOW UP OFFICE NOTE  KIPP HONER [id number]  Assessment/Plan:   Cervicogenic migraine Right sided occipital neuralgia   Refer to physical therapy for neck pain/cervicogenic headache Eletriptan '20mg'$  for abortive therapy.  Limit use of pain relievers to no more than 2 days out of week to prevent risk of rebound or medication-overuse headache. Keep headache diary Follow up in one year.  If physical therapy ineffective and he would like an occipital nerve block, he will contact the office.     Subjective:  Christian Ramos is a 62 year old right-handed man hypertension who follows up for cervicogenic headache    UPDATE: He stopped gabapentin as he just doesn't like taking medications. Intensity:  moderate Duration:  1 hour with eletriptan Frequency:  4 to 6 days a month  Current NSAIDS/analgesics:  none Current triptans:  eletriptan '20mg'$  Current ergotamine:  none Current anti-emetic:  none Current muscle relaxants:  none Current Antihypertensive medications:  metoprolol succinate, Hyzaar Current Antidepressant medications:  none Current Anticonvulsant medications:  none Current anti-CGRP:  none Current Vitamins/Herbal/Supplements:  none Current Antihistamines/Decongestants: none Other therapy:  none Hormone/birth control:  none  Caffeine:  no Alcohol:  3 to 5 drinks a week Smoker:  no Diet:  60 oz water Exercise:  yes Depression/stress:  no   HISTORY: In 2018, he reported onset of new headache 6-7/10 non-throbbing/pressure pain around 2016.  Starts at base of right side of head and radiates to right temporal-parietal region.  He calls it a pressure pain.  No numbness or tingling.  He denies pain radiating down the neck or into the arm.  When he moves his neck, he feels "crunching".  No aura.  Associated bilateral blurred vision, photophobia, phonophobia and a little lightheaded.  No nausea.  At that time, he reported headaches lasted 12 hours and  occurred 3 to 4 days a week.  No triggers or relieving factors.  Around the same time, he reported anosmia.  MRI of brain with and without contrast was performed on 09/26/16 to rule out mass lesion or other abnormality affecting the olfactory bulbs was personally reviewed and was normal.  Would treat with ibuprofen or Excedrin.  Tizanidine at night helped.  He was prescribed gabapentin but never started it as he was concerned about taking an anti-seizure medication.  He saw a spine surgeon who ordered X-rays as well as MRI which reportedly showed no structural cause for headaches.  I do not have these notes, images and patient does not remember who he saw.     He continued having headaches.  CT head on 06/20/2020 personally reviewed was normal.  By late 2022, they became frequent.  They would occur 10-20 days a month.  Sometimes he would be without headache for 5 days and then headaches would recur for several days.  He hasn't had a headache in 2 weeks.  He tried eletriptan, which aborts the headache quickly.  Prednisone taper was ineffective.      Past NSAIDS:  no Past analgesics:  no Past abortive triptans:  no Past muscle relaxants:  tizanidine '4mg'$  PRN Past anti-emetic:  no Past antihypertensive medications:  Prinzide, amlodipine, HCTZ Past antidepressant medications:  no Past anticonvulsant medications:  gabapentin '200mg'$  QHS Past vitamins/Herbal/Supplements:  no Past anthistamines/decongestants:   Claritin, Astelin NS Other past therapies:  no      No personal history of headache. Denies head injury prior to onset of symptoms  PAST MEDICAL HISTORY: Past Medical History:  Diagnosis Date  Anosmia    Hypertension    Obesity (BMI 30-39.9)    Vitreous hemorrhage ([place]) 09/2018   R    MEDICATIONS: Current Outpatient Medications on File Prior to Visit  Medication Sig Dispense Refill   albuterol (VENTOLIN HFA) 108 (90 Base) MCG/ACT inhaler Inhale 2 puffs into the lungs every 6 (six)  hours as needed for wheezing or shortness of breath. 48 g 1   eletriptan (RELPAX) 20 MG tablet Take 1 tablet (20 mg total) by mouth as needed for migraine or headache. May repeat in 2 hours if headache persists or recurs.  No more than 2 tablets in a 24-hour period 60 tablet 1   losartan-hydrochlorothiazide (HYZAAR) 100-25 MG tablet Take 1 tablet by mouth daily. 90 tablet 1   metoprolol succinate (TOPROL-XL) 50 MG 24 hr tablet Take 1 tablet (50 mg total) by mouth daily. 90 tablet 3   Multiple Vitamin (MULTIVITAMIN WITH MINERALS) TABS tablet Take 1 tablet by mouth daily.     predniSONE (DELTASONE) 10 MG tablet 4 tablets x 2 days, 3 tabs x 2 days, 2 tabs x 2 days, 1 tab x 2 days 20 tablet 0   Semaglutide-Weight Management (WEGOVY) 2.4 MG/0.75ML SOAJ Inject 2.4 mg into the skin once a week. 9 mL 1   sildenafil (REVATIO) 20 MG tablet Take 3-4 tablets (60-80 mg total) by mouth at bedtime as needed. 30 tablet 3   No current facility-administered medications on file prior to visit.    ALLERGIES: No Known Allergies  FAMILY HISTORY: Family History  Problem Relation Age of Onset   Breast cancer Mother        lumpectomy, XRT, chemo   Diabetes Mother    Hypertension Mother    Prostate cancer Father 52       ~ 17 y/o   Heart disease Father 35       MI    Colon cancer Neg Hx       Objective:  Blood pressure 121/80, pulse 72, height '5\' 9"'$  (1.753 m), weight 233 lb 3.2 oz (105.8 kg). General: No acute distress.  Patient appears well-groomed.   Head:  Normocephalic/atraumatic Eyes:  Fundi examined but not visualized Neck: supple, right suboccipital and upper cervical paraspinal tenderness, full range of motion Heart:  Regular rate and rhythm Neurological Exam: alert and oriented to person, place, and time.  Speech fluent and not dysarthric, language intact.  CN II-XII intact. Bulk and tone normal, muscle strength 5/5 throughout.  Sensation to light touch intact.  Deep tendon reflexes 2+  throughout.  Finger to nose testing intact.  Gait normal, Romberg negative.   Metta Clines, DO  CC: Kathlene November, MD

## 2023-01-08 ENCOUNTER — Ambulatory Visit: Payer: BC Managed Care – PPO | Admitting: Neurology

## 2023-01-11 ENCOUNTER — Ambulatory Visit: Payer: BC Managed Care – PPO | Attending: Neurology | Admitting: Physical Therapy

## 2023-01-11 DIAGNOSIS — R293 Abnormal posture: Secondary | ICD-10-CM | POA: Diagnosis not present

## 2023-01-11 DIAGNOSIS — M6281 Muscle weakness (generalized): Secondary | ICD-10-CM | POA: Insufficient documentation

## 2023-01-11 DIAGNOSIS — M5481 Occipital neuralgia: Secondary | ICD-10-CM | POA: Insufficient documentation

## 2023-01-11 DIAGNOSIS — G43009 Migraine without aura, not intractable, without status migrainosus: Secondary | ICD-10-CM | POA: Diagnosis not present

## 2023-01-11 NOTE — Therapy (Signed)
OUTPATIENT PHYSICAL THERAPY CERVICAL EVALUATION   Patient Name: Christian Ramos MRN: [id number] DOB:16-Jul-1961, 62 y.o., male Today's Date: 01/11/2023  END OF SESSION:  PT End of Session - 01/11/23 1018     Visit Number 1    Number of Visits 7   Plus eval   Date for PT Re-Evaluation 03/08/23   Due to delay in scheduling w/pt's travel schedule   Authorization Type [employer]    PT Start Time 1015    PT Stop Time 1058    PT Time Calculation (min) 43 min    Activity Tolerance Patient tolerated treatment well    Behavior During Therapy WFL for tasks assessed/performed             Past Medical History:  Diagnosis Date   Anosmia    Hypertension    Obesity (BMI 30-39.9)    Vitreous hemorrhage ([place]) 09/2018   R   Past Surgical History:  Procedure Laterality Date   NECK SURGERY  ~ 2000   mass removed anteriorly, Dr Warren Danes   VASECTOMY  ~2000   Patient Active Problem List   Diagnosis Date Noted   Erectile dysfunction 08/15/2022   SVT (supraventricular tachycardia) 12/29/2021   Demand ischemia 12/29/2021   Hyperlipidemia 12/29/2021   Pulmonary nodules 12/29/2021   Precordial chest pain 12/28/2021   Anosmia 08/21/2018   Exercise-induced asthma 01/18/2017   Cervicogenic headache 01/18/2017   Snoring, OSA? 01/18/2017   Annual physical exam 07/09/2016   PCP NOTES >>>>>>>>>>>>>>>>> 04/18/2016   Hypertension    Obesity (BMI 30-39.9)     PCP: Colon Branch, MD  REFERRING PROVIDER: Pieter Partridge, DO  REFERRING DIAG: [phone] (ICD-10-CM) - Migraine without aura and without status migrainosus, not intractable M54.81 (ICD-10-CM) - Occipital neuralgia of right side  THERAPY DIAG:  Abnormal posture  Muscle weakness (generalized)  Occipital neuralgia of right side  Rationale for Evaluation and Treatment: Rehabilitation  ONSET DATE: 01/04/2023 (referral)  SUBJECTIVE:                                                                                                                                                                                                          SUBJECTIVE STATEMENT: Pt reports all his headaches start at the same spot of his head (following nerve distribution of R occipital nerve). Reports when he moves his neck, it sounds like "a crunching water bottle". States Dr. Tomi Likens thinks his headaches are due to structural issues. 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 Can feel when a migraine is coming on, so will take meds that help. Most recent headache was on Tuesday during a trip.   PERTINENT HISTORY:  In 2018, he reported onset of new headache 6-7/10 non-throbbing/pressure pain around 2016.  Starts at base of right side of head and radiates to right temporal-parietal region.  He calls it a pressure pain.  No numbness or tingling.  He denies pain radiating down the neck or into the arm.  When he moves his neck, he feels "crunching".  No aura.  Associated bilateral blurred vision, photophobia, phonophobia and a little lightheaded.  No nausea.  At that time, he reported headaches lasted 12 hours and occurred 3 to 4 days a week.  No triggers or relieving factors.  Around the same time, he reported anosmia.  MRI of brain with and without contrast was performed on 09/26/16 to rule out mass lesion or other abnormality affecting the olfactory bulbs was personally reviewed and was normal.  Would treat with ibuprofen or Excedrin.  Tizanidine at night helped.  He was prescribed gabapentin but never started it as he was concerned about taking an anti-seizure medication.  He saw a spine surgeon who ordered X-rays as well as MRI which reportedly showed no structural cause for headaches.  I do not have these notes, images and patient does not remember who he saw.     He continued having headaches.  CT head on 06/20/2020 personally reviewed was normal.  By late 2022, they became frequent.  They would occur 10-20 days a  month.  Sometimes he would be without headache for 5 days and then headaches would recur for several days.  He hasn't had a headache in 2 weeks.  He tried eletriptan, which aborts the headache quickly.  Prednisone taper was ineffective.  PAIN:  Are you having pain? No  PRECAUTIONS: None  WEIGHT BEARING RESTRICTIONS: No  FALLS:  Has patient fallen in last 6 months? 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 Normal appearance of the brain.  No explanation for headache. 2. Clear olfactory recesses and unremarkable olfactory bulbs. No explanation for anosmia.  CT of Head from 05/2020  IMPRESSION: Normal head CT  PATIENT SURVEYS:  Headache Disability Index: 32/100 (moderate disability)   COGNITION: Overall cognitive status: Within functional limits for tasks assessed  SENSATION: WFL  POSTURE: rounded shoulders, forward head, increased thoracic kyphosis, and weight shift right  PALPATION: TTP on R upper trap and suboccipital region    CERVICAL ROM: Tested in seated position   Active ROM A/PROM (deg) eval  Flexion 39  Extension 56  Right lateral flexion 20  Left lateral flexion 25  Right rotation 57  Left rotation 49   (Blank rows = not tested)  UPPER EXTREMITY ROM: All WFL tested in seated position   Active ROM Right eval Left eval  Shoulder flexion    Shoulder extension    Shoulder abduction    Shoulder adduction    Shoulder extension    Shoulder internal rotation    Shoulder  external rotation    Elbow flexion    Elbow  extension    Wrist flexion    Wrist extension    Wrist ulnar deviation    Wrist radial deviation    Wrist pronation    Wrist supination     (Blank rows = not tested)   TODAY'S TREATMENT:    Next Session                                                                                                                               PATIENT EDUCATION:  Education details: POC, eval findings, initial HEP Person educated: Patient Education method: Explanation, Demonstration, Verbal cues, and Handouts Education comprehension: verbalized understanding and needs further education  HOME EXERCISE PROGRAM: Access Code: [email or web address] URL: [email or web address] Date: 01/11/2023 Prepared by: Mickie Bail Edger Husain  Exercises - Cervical Retraction at [place]  - 1 x daily - 7 x weekly - 3 sets - 10 reps - Single Arm Doorway Pec Stretch at 90 Degrees Abduction  - 1 x daily - 7 x weekly - 3 sets - 10 reps - Seated Windmill Trunk Rotation Stretch  - 1 x daily - 7 x weekly - 3 sets - 10 reps - Seated Upper Trapezius Stretch  - 1 x daily - 7 x weekly - 3 sets - 10 reps - Upper Trapezius Stretch  - 1 x daily - 7 x weekly - 3 sets - 10 reps - Wall Angels  - 1 x daily - 7 x weekly - 3 sets - 10 reps - Scapular Protraction at Wall  - 1 x daily - 7 x weekly - 3 sets - 10 reps  ASSESSMENT:  CLINICAL IMPRESSION: Patient is a 62 year old male referred to Neuro OPPT for migraines and occipital neuralgia. Pt's PMH is significant for: HTN. The following deficits were present during the exam: decreased cervical ROM, postural deficits and periscapular/paraspinal muscle weakness. Pt would benefit from skilled PT to address these impairments and functional limitations to maximize functional mobility independence.    OBJECTIVE IMPAIRMENTS: decreased mobility, decreased ROM, decreased strength, postural dysfunction, and pain  ACTIVITY LIMITATIONS: sitting  PARTICIPATION LIMITATIONS: driving, community activity,  and occupation  PERSONAL FACTORS: Fitness and Profession are also affecting patient's functional outcome.   REHAB POTENTIAL: Good  CLINICAL DECISION MAKING: Stable/uncomplicated  EVALUATION COMPLEXITY: Low   GOALS: Goals reviewed with patient? Yes  SHORT TERM GOALS: Target date: 02/01/2023  Pt will be independent with initial HEP for improved strength and mobility.  Baseline: Established on eval  Goal status: INITIAL  2.  Pt will improve left and right cervical lateral flexion by >/= 10 degrees from baseline for improved functional ROM  Baseline: Left: 25 degrees; right: 20 degrees Goal status: INITIAL  3.  Pt will improve cervical flexion by >/= 11 degrees from baseline for improved paraspinal mobility  Baseline: 39 degrees Goal status: INITIAL   LONG TERM GOALS: Target date: 02/22/2023   Pt  will be independent with final HEP for improved strength and mobility   Baseline:  Goal status: INITIAL  2.  Pt will improve cervical flexion ROM by >/= 21 degrees from baseline for improved functional mobility  Baseline: 39 degrees Goal status: INITIAL  3.  Pt will improve HDI to < 20 for improved QOL and reduced headache frequency  Baseline: 32 (moderate disability) Goal status: INITIAL   PLAN:  PT FREQUENCY: 1x/week  PT DURATION: 6 weeks   PLANNED INTERVENTIONS: Therapeutic exercises, Therapeutic activity, Neuromuscular re-education, Balance training, Gait training, Patient/Family education, Self Care, Joint mobilization, Joint manipulation, Vestibular training, Canalith repositioning, Dry Needling, Manual therapy, and Re-evaluation  PLAN FOR NEXT SESSION: Dry needling to suboccipitals, how is HEP? Continue working on periscapular strength/mobility and ROM   Dalene Robards E Wisdom Rickey, PT, DPT 01/11/2023, 11:00 AM

## 2023-01-18 ENCOUNTER — Ambulatory Visit: Payer: BC Managed Care – PPO | Admitting: Physical Therapy

## 2023-01-18 DIAGNOSIS — M5481 Occipital neuralgia: Secondary | ICD-10-CM | POA: Diagnosis not present

## 2023-01-18 DIAGNOSIS — R293 Abnormal posture: Secondary | ICD-10-CM | POA: Diagnosis not present

## 2023-01-18 DIAGNOSIS — M6281 Muscle weakness (generalized): Secondary | ICD-10-CM

## 2023-01-18 DIAGNOSIS — G43009 Migraine without aura, not intractable, without status migrainosus: Secondary | ICD-10-CM | POA: Diagnosis not present

## 2023-01-18 NOTE — Therapy (Signed)
OUTPATIENT PHYSICAL THERAPY CERVICAL TREATMENT   Patient Name: Christian Ramos MRN: [id number] DOB:November 25, 1960, 62 y.o., male Today's Date: 01/18/2023  END OF SESSION:  PT End of Session - 01/18/23 1021     Visit Number 2    Number of Visits 7   Plus eval   Date for PT Re-Evaluation 03/08/23   Due to delay in scheduling w/pt's travel schedule   Authorization Type [employer]    PT Start Time 1020    PT Stop Time 1048   pt done with treatment   PT Time Calculation (min) 28 min    Activity Tolerance Patient tolerated treatment well    Behavior During Therapy [facility] for tasks assessed/performed              Past Medical History:  Diagnosis Date   Anosmia    Hypertension    Obesity (BMI 30-39.9)    Vitreous hemorrhage ([place]) 09/2018   R   Past Surgical History:  Procedure Laterality Date   NECK SURGERY  ~ 2000   mass removed anteriorly, Dr Warren Danes   VASECTOMY  ~2000   Patient Active Problem List   Diagnosis Date Noted   Erectile dysfunction 08/15/2022   SVT (supraventricular tachycardia) 12/29/2021   Demand ischemia 12/29/2021   Hyperlipidemia 12/29/2021   Pulmonary nodules 12/29/2021   Precordial chest pain 12/28/2021   Anosmia 08/21/2018   Exercise-induced asthma 01/18/2017   Cervicogenic headache 01/18/2017   Snoring, OSA? 01/18/2017   Annual physical exam 07/09/2016   PCP NOTES >>>>>>>>>>>>>>>>> 04/18/2016   Hypertension    Obesity (BMI 30-39.9)     PCP: Colon Branch, MD  REFERRING PROVIDER: Pieter Partridge, DO  REFERRING DIAG: [phone] (ICD-10-CM) - Migraine without aura and without status migrainosus, not intractable M54.81 (ICD-10-CM) - Occipital neuralgia of right side  THERAPY DIAG:  Abnormal posture  Muscle weakness (generalized)  Occipital neuralgia of right side  Rationale for Evaluation and Treatment: Rehabilitation  ONSET DATE: 01/04/2023 (referral)  SUBJECTIVE:                                                                                                                                                                                                          SUBJECTIVE STATEMENT: Pt reports that he did his exercises while traveling this week, only had one headache Tuesday this week. Pt reports that he typically gets 3-5 headaches per month (about one per week). Pt reports the pec doorway stretch has been the most helpful. Pt has been alternating Tylenol and Advil to treat headaches.  PERTINENT HISTORY:  In 2018, he reported onset of new headache 6-7/10 non-throbbing/pressure pain around 2016.  Starts at base of right side of head and radiates to right temporal-parietal region.  He calls it a pressure pain.  No numbness or tingling.  He denies pain radiating down the neck or into the arm.  When he moves his neck, he feels "crunching".  No aura.  Associated bilateral blurred vision, photophobia, phonophobia and a little lightheaded.  No nausea.  At that time, he reported headaches lasted 12 hours and occurred 3 to 4 days a week.  No triggers or relieving factors.  Around the same time, he reported anosmia.  MRI of brain with and without contrast was performed on 09/26/16 to rule out mass lesion or other abnormality affecting the olfactory bulbs was personally reviewed and was normal.  Would treat with ibuprofen or Excedrin.  Tizanidine at night helped.  He was prescribed gabapentin but never started it as he was concerned about taking an anti-seizure medication.  He saw a spine surgeon who ordered X-rays as well as MRI which reportedly showed no structural cause for headaches.  I do not have these notes, images and patient does not remember who he saw.     He continued having headaches.  CT head on 06/20/2020 personally reviewed was normal.  By late 2022, they became frequent.  They would occur 10-20 days a month.  Sometimes he would be without headache for 5 days and then headaches would recur for several days.  He hasn't had a headache in 2 weeks.  He  tried eletriptan, which aborts the headache quickly.  Prednisone taper was ineffective.  PAIN:  Are you having pain? No  PRECAUTIONS: None  WEIGHT BEARING RESTRICTIONS: No  FALLS:  Has patient fallen in last 6 months? 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 Normal appearance of the brain.  No explanation for headache. 2. Clear olfactory recesses and unremarkable olfactory bulbs. No explanation for anosmia.  CT of Head from 05/2020  IMPRESSION: Normal head CT  PATIENT SURVEYS:  Headache Disability Index: 32/100 (moderate disability)   COGNITION: Overall cognitive status: Within functional limits for tasks assessed  SENSATION: WFL  POSTURE: rounded shoulders, forward head, increased thoracic kyphosis, and weight shift right  PALPATION: TTP on R upper trap and suboccipital region    CERVICAL ROM: Tested in seated position   Active ROM A/PROM (deg) eval  Flexion 39  Extension 56  Right lateral flexion 20  Left lateral flexion 25  Right rotation 57  Left rotation 49   (Blank rows = not tested)  UPPER EXTREMITY ROM: All WFL tested in seated position   Active ROM Right eval Left eval  Shoulder flexion    Shoulder extension    Shoulder abduction    Shoulder adduction    Shoulder extension    Shoulder internal rotation    Shoulder external rotation    Elbow flexion    Elbow extension    Wrist flexion    Wrist extension    Wrist ulnar deviation    Wrist  radial deviation    Wrist pronation    Wrist supination      (Blank rows = not tested)   TODAY'S TREATMENT:    TherEx Supine chin tucks x 15 reps Seated cervical rotation stretch 5 x 15-20 sec each with towel  Added to HEP, see bolded below  Manual Therapy Suboccipital release 5 x 45 sec each, release of pain/tightness with treatment  TherAct Trigger Point Dry-Needling  Treatment instructions: Expect mild to moderate muscle soreness. S/S of pneumothorax if dry needled over a lung field, and to seek immediate medical attention should they occur. Patient verbalized understanding of these instructions and education.  Patient Consent Given: Yes Education handout provided: Yes Muscles treated: R suboccipitals Treatment response/outcome: deep ache/pressure; increased sensitivity in more lateral TP   Trigger Point Dry Needling  What is Trigger Point Dry Needling (DN)? DN is a physical therapy technique used to treat muscle pain and dysfunction. Specifically, DN helps deactivate muscle trigger points (muscle knots).  A thin filiform needle is used to penetrate the skin and stimulate the underlying trigger point. The goal is for a local twitch response (LTR) to occur and for the trigger point to relax. No medication of any kind is injected during the procedure.   What Does Trigger Point Dry Needling Feel Like?  The procedure feels different for each individual patient. Some patients report that they do not actually feel the needle enter the skin and overall the process is not painful. Very mild bleeding may occur. However, many patients feel a deep cramping in the muscle in which the needle was inserted. This is the local twitch response.   How Will I feel after the treatment? Soreness is normal, and the onset of soreness may not occur for a few hours. Typically this soreness does not last longer than two days.  Bruising is uncommon, however; ice can be used to decrease any possible bruising.  In rare cases feeling tired or nauseous after the  treatment is normal. In addition, your symptoms may get worse before they get better, this period will typically not last longer than 24 hours.   What Can I do After My Treatment? Increase your hydration by drinking more water for the next 24 hours. You may place ice or heat on the areas treated that have become sore, however, do not use heat on inflamed or bruised areas. Heat often brings more relief post needling. You can continue your regular activities, but vigorous activity is not recommended initially after the treatment for 24 hours. DN is best combined with other physical therapy such as strengthening, stretching, and other therapies.                                                                                                                              PATIENT EDUCATION:  Education details: TPDN (see above), added to HEP Person educated: Patient Education method: Explanation, Demonstration, Verbal cues, and Handouts Education comprehension: verbalized  understanding and needs further education  HOME EXERCISE PROGRAM: Access Code: [email or web address] URL: [email or web address] Date: 01/11/2023 Prepared by: Mickie Bail Plaster  Exercises - Cervical Retraction at [place]  - 1 x daily - 7 x weekly - 3 sets - 10 reps - Single Arm Doorway Pec Stretch at 90 Degrees Abduction  - 1 x daily - 7 x weekly - 3 sets - 10 reps - Seated Windmill Trunk Rotation Stretch  - 1 x daily - 7 x weekly - 3 sets - 10 reps - Seated Upper Trapezius Stretch  - 1 x daily - 7 x weekly - 3 sets - 10 reps - Upper Trapezius Stretch  - 1 x daily - 7 x weekly - 3 sets - 10 reps - Wall Angels  - 1 x daily - 7 x weekly - 3 sets - 10 reps - Scapular Protraction at Wall  - 1 x daily - 7 x weekly - 3 sets - 10 reps - Supine Suboccipital Release with Tennis Balls  - 1 x daily - 7 x weekly - 1 sets - 1 reps - 5-10 min hold - Supine Chin Tuck  - 1 x daily - 7 x weekly - 3 sets - 10 reps - Seated Assisted Cervical  Rotation with Towel  - 1 x daily - 7 x weekly - 1 sets - 10 reps - 15-20 sec hold  ASSESSMENT:  CLINICAL IMPRESSION: Emphasis of skilled PT session on initiating TPDN followed by therex and stretching to address ongoing suboccipital pain and tightness in suboccipital musculature. Pt with increased sensitivity to DN in lateral suboccipital muscles but able to tolerate DN and manual therapy this session. Pt continues to benefit from skilled therapy services to work towards decreasing pain, improving cervical mobility, increasing overall functional level. Continue POC.    OBJECTIVE IMPAIRMENTS: decreased mobility, decreased ROM, decreased strength, postural dysfunction, and pain  ACTIVITY LIMITATIONS: sitting  PARTICIPATION LIMITATIONS: driving, community activity, and occupation  PERSONAL FACTORS: Fitness and Profession are also affecting patient's functional outcome.   REHAB POTENTIAL: Good  CLINICAL DECISION MAKING: Stable/uncomplicated  EVALUATION COMPLEXITY: Low   GOALS: Goals reviewed with patient? Yes  SHORT TERM GOALS: Target date: 02/01/2023  Pt will be independent with initial HEP for improved strength and mobility.  Baseline: Established on eval  Goal status: INITIAL  2.  Pt will improve left and right cervical lateral flexion by >/= 10 degrees from baseline for improved functional ROM  Baseline: Left: 25 degrees; right: 20 degrees Goal status: INITIAL  3.  Pt will improve cervical flexion by >/= 11 degrees from baseline for improved paraspinal mobility  Baseline: 39 degrees Goal status: INITIAL   LONG TERM GOALS: Target date: 02/22/2023   Pt will be independent with final HEP for improved strength and mobility   Baseline:  Goal status: INITIAL  2.  Pt will improve cervical flexion ROM by >/= 21 degrees from baseline for improved functional mobility  Baseline: 39 degrees Goal status: INITIAL  3.  Pt will improve HDI to < 20 for improved QOL and reduced  headache frequency  Baseline: 32 (moderate disability) Goal status: INITIAL   PLAN:  PT FREQUENCY: 1x/week  PT DURATION: 6 weeks   PLANNED INTERVENTIONS: Therapeutic exercises, Therapeutic activity, Neuromuscular re-education, Balance training, Gait training, Patient/Family education, Self Care, Joint mobilization, Joint manipulation, Vestibular training, Canalith repositioning, Dry Needling, Manual therapy, and Re-evaluation  PLAN FOR NEXT SESSION: Dry needling to suboccipitals, how is HEP? Continue working on periscapular strength/mobility and ROM  Excell Seltzer, PT, DPT, CSRS 01/18/2023, 10:49 AM

## 2023-01-19 ENCOUNTER — Encounter: Payer: Self-pay | Admitting: Internal Medicine

## 2023-01-21 ENCOUNTER — Encounter: Payer: Self-pay | Admitting: Internal Medicine

## 2023-01-21 MED ORDER — METOPROLOL SUCCINATE ER 50 MG PO TB24: 50.0000 mg | ORAL_TABLET | Freq: Every day | ORAL | 1 refills | Status: DC

## 2023-01-21 MED ORDER — LOSARTAN POTASSIUM-HCTZ 100-25 MG PO TABS: 1.0000 | ORAL_TABLET | Freq: Every day | ORAL | 1 refills | Status: DC

## 2023-01-22 ENCOUNTER — Telehealth: Payer: BC Managed Care – PPO | Admitting: Physician Assistant

## 2023-01-22 DIAGNOSIS — B9689 Other specified bacterial agents as the cause of diseases classified elsewhere: Secondary | ICD-10-CM

## 2023-01-22 DIAGNOSIS — J208 Acute bronchitis due to other specified organisms: Secondary | ICD-10-CM

## 2023-01-22 MED ORDER — PROMETHAZINE-DM 6.25-15 MG/5ML PO SYRP: 5.0000 mL | ORAL_SOLUTION | Freq: Four times a day (QID) | ORAL | 0 refills | Status: DC | PRN

## 2023-01-22 MED ORDER — DOXYCYCLINE HYCLATE 100 MG PO TABS: 100.0000 mg | ORAL_TABLET | Freq: Two times a day (BID) | ORAL | 0 refills | Status: DC

## 2023-01-22 NOTE — Patient Instructions (Addendum)
Artis Delay, thank you for joining [name], PA-C for today's virtual visit.  While this provider is not your primary care provider (PCP), if your PCP is located in our provider database this encounter information will be shared with them immediately following your visit.   [facility] account gives you access to today's visit and all your visits, tests, and labs performed at [facility] " click here if you don't have a [place] [place] account or go to [email or web address]  Consent: (Patient) Artis Delay provided verbal consent for this virtual visit at the beginning of the encounter.  Current Medications:  Current Outpatient Medications:    albuterol (VENTOLIN HFA) 108 (90 Base) MCG/ACT inhaler, Inhale 2 puffs into the lungs every 6 (six) hours as needed for wheezing or shortness of breath., Disp: 48 g, Rfl: 1   eletriptan (RELPAX) 20 MG tablet, Take 1 tablet (20 mg total) by mouth as needed for migraine or headache. May repeat in 2 hours if headache persists or recurs.  No more than 2 tablets in a 24-hour period., Disp: 10 tablet, Rfl: 5   losartan-hydrochlorothiazide (HYZAAR) 100-25 MG tablet, Take 1 tablet by mouth daily., Disp: 90 tablet, Rfl: 1   metoprolol succinate (TOPROL-XL) 50 MG 24 hr tablet, Take 1 tablet (50 mg total) by mouth daily., Disp: 90 tablet, Rfl: 1   Multiple Vitamin (MULTIVITAMIN WITH MINERALS) TABS tablet, Take 1 tablet by mouth daily., Disp: , Rfl:    Semaglutide-Weight Management (WEGOVY) 2.4 MG/0.75ML SOAJ, Inject 2.4 mg into the skin once a week., Disp: 9 mL, Rfl: 1   sildenafil (REVATIO) 20 MG tablet, Take 3-4 tablets (60-80 mg total) by mouth at bedtime as needed., Disp: 30 tablet, Rfl: 3   Medications ordered in this encounter:  No orders of the defined types were placed in this encounter.    *If you need refills on other medications prior to your next appointment, please contact your  pharmacy*  Follow-Up: Call back or seek an in-person evaluation if the symptoms worsen or if the condition fails to improve as anticipated.  Start [phone]  Other Instructions Take antibiotic (Doxycycline) as directed.  Increase fluids.  Get plenty of rest. Use plain Mucinex for congestion. Cough syrup as directed. Take a daily probiotic (I recommend Align or Culturelle, but even Activia Yogurt may be beneficial).  A humidifier placed in the bedroom may offer some relief for a dry, scratchy throat of nasal irritation.  Read information below on acute bronchitis. Please call or return to clinic if symptoms are not improving.  Acute Bronchitis Bronchitis is when the airways that extend from the windpipe into the lungs get red, puffy, and painful (inflamed). Bronchitis often causes thick spit (mucus) to develop. This leads to a cough. A cough is the most common symptom of bronchitis. In acute bronchitis, the condition usually begins suddenly and goes away over time (usually in 2 weeks). Smoking, allergies, and asthma can make bronchitis worse. Repeated episodes of bronchitis may cause more lung problems.  HOME CARE Rest. Drink enough fluids to keep your pee (urine) clear or pale yellow (unless you need to limit fluids as told by your doctor). Only take over-the-counter or prescription medicines as told by your doctor. Avoid smoking and secondhand smoke. These can make bronchitis worse. If you are a smoker, think about using nicotine gum or skin patches. Quitting smoking will help your lungs heal faster. Reduce the chance of getting bronchitis again  by: Washing your hands often. Avoiding people with cold symptoms. Trying not to touch your hands to your mouth, nose, or eyes. Follow up with your doctor as told.  GET HELP IF: Your symptoms do not improve after 1 week of treatment. Symptoms include: Cough. Fever. Coughing up thick spit. Body aches. Chest  congestion. Chills. Shortness of breath. Sore throat.  GET HELP RIGHT AWAY IF:  You have an increased fever. You have chills. You have severe shortness of breath. You have bloody thick spit (sputum). You throw up (vomit) often. You lose too much body fluid (dehydration). You have a severe headache. You faint.  MAKE SURE YOU:  Understand these instructions. Will watch your condition. Will get help right away if you are not doing well or get worse. Document Released: 04/02/2008 Document Revised: 06/17/2013 Document Reviewed: 04/07/2013 [facility] Patient Information 2015 [place], [place]. This information is not intended to replace advice given to you by your health care provider. Make sure you discuss any questions you have with your health care provider.   If you have been instructed to have an in-person evaluation today at a local Urgent Care facility, please use the link below. It will take you to a list of all of our available [facility], including address, phone number and hours of operation. Please do not delay care.  [facility]  If you or a family member do not have a primary care provider, use the link below to schedule a visit and establish care. When you choose a [facility] primary care physician or advanced practice provider, you gain a long-term partner in health. Find a Primary Care Provider  Learn more about [facility] in-office and virtual care options: [facility]

## 2023-01-22 NOTE — Progress Notes (Signed)
Virtual Visit Consent   Christian Ramos, you are scheduled for a virtual visit with a [facility] provider today. Just as with appointments in the office, your consent must be obtained to participate. Your consent will be active for this visit and any virtual visit you may have with one of our providers in the next 365 days. If you have a MyChart account, a copy of this consent can be sent to you electronically.  As this is a virtual visit, video technology does not allow for your provider to perform a traditional examination. This may limit your provider's ability to fully assess your condition. If your provider identifies any concerns that need to be evaluated in person or the need to arrange testing (such as labs, EKG, etc.), we will make arrangements to do so. Although advances in technology are sophisticated, we cannot ensure that it will always work on either your end or our end. If the connection with a video visit is poor, the visit may have to be switched to a telephone visit. With either a video or telephone visit, we are not always able to ensure that we have a secure connection.  By engaging in this virtual visit, you consent to the provision of healthcare and authorize for your insurance to be billed (if applicable) for the services provided during this visit. Depending on your insurance coverage, you may receive a charge related to this service.  I need to obtain your verbal consent now. Are you willing to proceed with your visit today? Christian Ramos has provided verbal consent on 01/22/2023 for a virtual visit (video or telephone). Christian Ramos, [place]  Date: 01/22/2023 7:39 PM  Virtual Visit via Video Note   I, Christian Ramos, connected with  KRIDAY SEMPER  ([id number], January 07, 1961) on 01/22/23 at  7:45 PM EDT by a video-enabled telemedicine application and verified that I am speaking with the correct person using two identifiers.  Location: Patient: Virtual Visit  Location Patient: Home Provider: Virtual Visit Location Provider: Home Office   I discussed the limitations of evaluation and management by telemedicine and the availability of in person appointments. The patient expressed understanding and agreed to proceed.    History of Present Illness: Christian Ramos is a 62 y.o. who identifies as a male who was assigned male at birth, and is being seen today for URI symptoms starting last week and worsening since last Thursday. Some nasal congestion and drainage but mainly chest congestion and cough that has now become productive of a green sputum. Denies chest pain or SOB. Denies known sick contact.   OTC -- Mucinex-DM, Sudafed, Benadryl.   HPI: HPI  Problems:  Patient Active Problem List   Diagnosis Date Noted   Erectile dysfunction 08/15/2022   SVT (supraventricular tachycardia) 12/29/2021   Demand ischemia 12/29/2021   Hyperlipidemia 12/29/2021   Pulmonary nodules 12/29/2021   Precordial chest pain 12/28/2021   Anosmia 08/21/2018   Exercise-induced asthma 01/18/2017   Cervicogenic headache 01/18/2017   Snoring, OSA? 01/18/2017   Annual physical exam 07/09/2016   PCP NOTES >>>>>>>>>>>>>>>>> 04/18/2016   Hypertension    Obesity (BMI 30-39.9)     Allergies: No Known Allergies Medications:  Current Outpatient Medications:    doxycycline (VIBRA-TABS) 100 MG tablet, Take 1 tablet (100 mg total) by mouth 2 (two) times daily., Disp: 14 tablet, Rfl: 0   promethazine-dextromethorphan (PROMETHAZINE-DM) 6.25-15 MG/5ML syrup, Take 5 mLs by mouth 4 (four) times daily as needed for cough., Disp:  118 mL, Rfl: 0   albuterol (VENTOLIN HFA) 108 (90 Base) MCG/ACT inhaler, Inhale 2 puffs into the lungs every 6 (six) hours as needed for wheezing or shortness of breath., Disp: 48 g, Rfl: 1   eletriptan (RELPAX) 20 MG tablet, Take 1 tablet (20 mg total) by mouth as needed for migraine or headache. May repeat in 2 hours if headache persists or recurs.  No more  than 2 tablets in a 24-hour period., Disp: 10 tablet, Rfl: 5   losartan-hydrochlorothiazide (HYZAAR) 100-25 MG tablet, Take 1 tablet by mouth daily., Disp: 90 tablet, Rfl: 1   metoprolol succinate (TOPROL-XL) 50 MG 24 hr tablet, Take 1 tablet (50 mg total) by mouth daily., Disp: 90 tablet, Rfl: 1   Multiple Vitamin (MULTIVITAMIN WITH MINERALS) TABS tablet, Take 1 tablet by mouth daily., Disp: , Rfl:    Semaglutide-Weight Management (WEGOVY) 2.4 MG/0.75ML SOAJ, Inject 2.4 mg into the skin once a week., Disp: 9 mL, Rfl: 1   sildenafil (REVATIO) 20 MG tablet, Take 3-4 tablets (60-80 mg total) by mouth at bedtime as needed., Disp: 30 tablet, Rfl: 3  Observations/Objective: Patient is well-developed, well-nourished in no acute distress.  Resting comfortably at home.  Head is normocephalic, atraumatic.  No labored breathing. Speech is clear and coherent with logical content.  Patient is alert and oriented at baseline.   Assessment and Plan: 1. Acute bacterial bronchitis - promethazine-dextromethorphan (PROMETHAZINE-DM) 6.25-15 MG/5ML syrup; Take 5 mLs by mouth 4 (four) times daily as needed for cough.  Dispense: 118 mL; Refill: 0 - doxycycline (VIBRA-TABS) 100 MG tablet; Take 1 tablet (100 mg total) by mouth 2 (two) times daily.  Dispense: 14 tablet; Refill: 0  Rx Doxycycline.  Increase fluids.  Rest.  Saline nasal spray.  Probiotic.  Mucinex as directed.  Humidifier in bedroom. Promethazine-DM per orders.  Call or return to clinic if symptoms are not improving.   Follow Up Instructions: I discussed the assessment and treatment plan with the patient. The patient was provided an opportunity to ask questions and all were answered. The patient agreed with the plan and demonstrated an understanding of the instructions.  A copy of instructions were sent to the patient via MyChart unless otherwise noted below.   The patient was advised to call back or seek an in-person evaluation if the symptoms worsen  or if the condition fails to improve as anticipated.  Time:  I spent 10 minutes with the patient via telehealth technology discussing the above problems/concerns.    Christian Rio, PA-C

## 2023-01-23 ENCOUNTER — Ambulatory Visit: Payer: BC Managed Care – PPO | Admitting: Physical Therapy

## 2023-01-23 DIAGNOSIS — G43009 Migraine without aura, not intractable, without status migrainosus: Secondary | ICD-10-CM | POA: Diagnosis not present

## 2023-01-23 DIAGNOSIS — R293 Abnormal posture: Secondary | ICD-10-CM | POA: Diagnosis not present

## 2023-01-23 DIAGNOSIS — M6281 Muscle weakness (generalized): Secondary | ICD-10-CM

## 2023-01-23 DIAGNOSIS — M5481 Occipital neuralgia: Secondary | ICD-10-CM

## 2023-01-23 NOTE — Therapy (Signed)
OUTPATIENT PHYSICAL THERAPY CERVICAL TREATMENT   Patient Name: Christian Ramos MRN: [id number] DOB:07-11-1961, 62 y.o., male Today's Date: 01/23/2023  END OF SESSION:  PT End of Session - 01/23/23 1534     Visit Number 3    Number of Visits 7   Plus eval   Date for PT Re-Evaluation 03/08/23   Due to delay in scheduling w/pt's travel schedule   Authorization Type BCBS    PT Start Time 1532    PT Stop Time 1610    PT Time Calculation (min) 38 min    Activity Tolerance Patient tolerated treatment well    Behavior During Therapy [facility] for tasks assessed/performed              Past Medical History:  Diagnosis Date   Anosmia    Hypertension    Obesity (BMI 30-39.9)    Vitreous hemorrhage ([place]) 09/2018   R   Past Surgical History:  Procedure Laterality Date   NECK SURGERY  ~ 2000   mass removed anteriorly, Dr Warren Danes   VASECTOMY  ~2000   Patient Active Problem List   Diagnosis Date Noted   Erectile dysfunction 08/15/2022   SVT (supraventricular tachycardia) 12/29/2021   Demand ischemia 12/29/2021   Hyperlipidemia 12/29/2021   Pulmonary nodules 12/29/2021   Precordial chest pain 12/28/2021   Anosmia 08/21/2018   Exercise-induced asthma 01/18/2017   Cervicogenic headache 01/18/2017   Snoring, OSA? 01/18/2017   Annual physical exam 07/09/2016   PCP NOTES >>>>>>>>>>>>>>>>> 04/18/2016   Hypertension    Obesity (BMI 30-39.9)     PCP: Colon Branch, MD  REFERRING PROVIDER: Pieter Partridge, DO  REFERRING DIAG: [phone] (ICD-10-CM) - Migraine without aura and without status migrainosus, not intractable M54.81 (ICD-10-CM) - Occipital neuralgia of right side  THERAPY DIAG:  Occipital neuralgia of right side  Abnormal posture  Muscle weakness (generalized)  Rationale for Evaluation and Treatment: Rehabilitation  ONSET DATE: 01/04/2023 (referral)  SUBJECTIVE:                                                                                                                                                                                                          SUBJECTIVE STATEMENT: Pt reports doing well. Really enjoyed the DN. The towel stretch is his new favorite. Had one headache Monday night but was able to take Advil and work on his stretches to lessen it.   PERTINENT HISTORY:  In 2018, he reported onset of new headache 6-7/10 non-throbbing/pressure pain around 2016.  Starts at base of right side of head  and radiates to right temporal-parietal region.  He calls it a pressure pain.  No numbness or tingling.  He denies pain radiating down the neck or into the arm.  When he moves his neck, he feels "crunching".  No aura.  Associated bilateral blurred vision, photophobia, phonophobia and a little lightheaded.  No nausea.  At that time, he reported headaches lasted 12 hours and occurred 3 to 4 days a week.  No triggers or relieving factors.  Around the same time, he reported anosmia.  MRI of brain with and without contrast was performed on 09/26/16 to rule out mass lesion or other abnormality affecting the olfactory bulbs was personally reviewed and was normal.  Would treat with ibuprofen or Excedrin.  Tizanidine at night helped.  He was prescribed gabapentin but never started it as he was concerned about taking an anti-seizure medication.  He saw a spine surgeon who ordered X-rays as well as MRI which reportedly showed no structural cause for headaches.  I do not have these notes, images and patient does not remember who he saw.     He continued having headaches.  CT head on 06/20/2020 personally reviewed was normal.  By late 2022, they became frequent.  They would occur 10-20 days a month.  Sometimes he would be without headache for 5 days and then headaches would recur for several days.  He hasn't had a headache in 2 weeks.  He tried eletriptan, which aborts the headache quickly.  Prednisone taper was ineffective.  PAIN:  Are you having pain? No  PRECAUTIONS:  None  WEIGHT BEARING RESTRICTIONS: No  FALLS:  Has patient fallen in last 6 months? 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 Normal appearance of the brain.  No explanation for headache. 2. Clear olfactory recesses and unremarkable olfactory bulbs. No explanation for anosmia.  CT of Head from 05/2020  IMPRESSION: Normal head CT  PATIENT SURVEYS:  Headache Disability Index: 32/100 (moderate disability)   COGNITION: Overall cognitive status: Within functional limits for tasks assessed  SENSATION: WFL  POSTURE: rounded shoulders, forward head, increased thoracic kyphosis, and weight shift right  PALPATION: TTP on R upper trap and suboccipital region    CERVICAL ROM: Tested in seated position   Active ROM A/PROM (deg) eval  Flexion 39  Extension 56  Right lateral flexion 20  Left lateral flexion 25  Right rotation 57  Left rotation 49   (Blank rows = not tested)  UPPER EXTREMITY ROM: All WFL tested in seated position   Active ROM Right eval Left eval  Shoulder flexion    Shoulder extension    Shoulder abduction    Shoulder adduction    Shoulder extension    Shoulder internal rotation    Shoulder external rotation    Elbow flexion    Elbow extension    Wrist flexion    Wrist extension    Wrist ulnar deviation    Wrist radial deviation    Wrist pronation    Wrist supination     (Blank rows = not tested)  TODAY'S TREATMENT:    Manual Therapy  Supine sub occipital release, trigger point release of cervical  paraspinals (splenius capitis on R side), Upper trap stretch bilaterally   Ther Ex Seated trigger point release of splenius capitis using lacrosse ball, x2 minutes  Supermans x12 w/3-5s isometric hold for improved posture and posterior chain strength.  PVC passthroughs x10, for improved postural control and shoulder ROM  PVC around the worlds, x5 each direction, for continued shoulder ROM. Increased difficulty performing in clockwise direction.                                                                                                                             PATIENT EDUCATION:  Education details: Continue HEP, purchasing lacrosse ball for trigger point relief, chirp wheel handout.  Person educated: Patient Education method: Explanation, Demonstration, Verbal cues, and Handouts Education comprehension: verbalized understanding and needs further education  HOME EXERCISE PROGRAM: Access Code: [email or web address] URL: [email or web address] Date: 01/11/2023 Prepared by: Mickie Bail Sorayah Schrodt  Exercises - Cervical Retraction at [place]  - 1 x daily - 7 x weekly - 3 sets - 10 reps - Single Arm Doorway Pec Stretch at 90 Degrees Abduction  - 1 x daily - 7 x weekly - 3 sets - 10 reps - Seated Windmill Trunk Rotation Stretch  - 1 x daily - 7 x weekly - 3 sets - 10 reps - Seated Upper Trapezius Stretch  - 1 x daily - 7 x weekly - 3 sets - 10 reps - Upper Trapezius Stretch  - 1 x daily - 7 x weekly - 3 sets - 10 reps - Wall Angels  - 1 x daily - 7 x weekly - 3 sets - 10 reps - Scapular Protraction at Wall  - 1 x daily - 7 x weekly - 3 sets - 10 reps - Supine Suboccipital Release with Tennis Balls  - 1 x daily - 7 x weekly - 1 sets - 1 reps - 5-10 min hold - Supine Chin Tuck  - 1 x daily - 7 x weekly - 3 sets - 10 reps - Seated Assisted Cervical Rotation with Towel  - 1 x daily - 7 x weekly - 1 sets - 10 reps - 15-20 sec hold  ASSESSMENT:  CLINICAL IMPRESSION: Emphasis of skilled PT session  on trigger point release of R splenius capitis, shoulder ROM and posterior chain strength. Pt reports increase in ROM and decreased pain this date. Pt continues to be limited by tightness in R paraspinals, likely splenius capitis, limiting his cervical rotation and functional use of cervical spine. Due to pt's history of headaches, pt demonstrates significant guarding and shrug compensation on R side w/OH movements or scapular retraction. Continue POC.    OBJECTIVE IMPAIRMENTS: decreased mobility, decreased ROM, decreased strength, postural dysfunction, and pain  ACTIVITY LIMITATIONS: sitting  PARTICIPATION LIMITATIONS: driving, community activity, and occupation  PERSONAL FACTORS: Fitness and Profession are also affecting patient's functional outcome.  REHAB POTENTIAL: Good  CLINICAL DECISION MAKING: Stable/uncomplicated  EVALUATION COMPLEXITY: Low   GOALS: Goals reviewed with patient? Yes  SHORT TERM GOALS: Target date: 02/01/2023  Pt will be independent with initial HEP for improved strength and mobility.  Baseline: Established on eval  Goal status: INITIAL  2.  Pt will improve left and right cervical lateral flexion by >/= 10 degrees from baseline for improved functional ROM  Baseline: Left: 25 degrees; right: 20 degrees Goal status: INITIAL  3.  Pt will improve cervical flexion by >/= 11 degrees from baseline for improved paraspinal mobility  Baseline: 39 degrees Goal status: INITIAL   LONG TERM GOALS: Target date: 02/22/2023   Pt will be independent with final HEP for improved strength and mobility   Baseline:  Goal status: INITIAL  2.  Pt will improve cervical flexion ROM by >/= 21 degrees from baseline for improved functional mobility  Baseline: 39 degrees Goal status: INITIAL  3.  Pt will improve HDI to < 20 for improved QOL and reduced headache frequency  Baseline: 32 (moderate disability) Goal status: INITIAL   PLAN:  PT FREQUENCY: 1x/week  PT  DURATION: 6 weeks   PLANNED INTERVENTIONS: Therapeutic exercises, Therapeutic activity, Neuromuscular re-education, Balance training, Gait training, Patient/Family education, Self Care, Joint mobilization, Joint manipulation, Vestibular training, Canalith repositioning, Dry Needling, Manual therapy, and Re-evaluation  PLAN FOR NEXT SESSION: Dry needling to suboccipitals, how is HEP? Continue working on periscapular strength/mobility and ROM, serratus wall slides, prone snow angels   Camyah Pultz E Antonya Leeder, PT, DPT 01/23/2023, 4:11 PM

## 2023-01-30 ENCOUNTER — Ambulatory Visit: Payer: BC Managed Care – PPO | Attending: Neurology | Admitting: Physical Therapy

## 2023-01-30 DIAGNOSIS — R293 Abnormal posture: Secondary | ICD-10-CM | POA: Insufficient documentation

## 2023-01-30 DIAGNOSIS — M5481 Occipital neuralgia: Secondary | ICD-10-CM | POA: Diagnosis not present

## 2023-01-30 DIAGNOSIS — M6281 Muscle weakness (generalized): Secondary | ICD-10-CM | POA: Diagnosis not present

## 2023-01-30 NOTE — Therapy (Signed)
OUTPATIENT PHYSICAL THERAPY CERVICAL TREATMENT   Patient Name: RUBE BOSWORTH MRN: [id number] DOB:[birth year], 62 y.o., male Today's Date: 01/30/2023  END OF SESSION:  PT End of Session - 01/30/23 1538     Visit Number 4    Number of Visits 7   Plus eval   Date for PT Re-Evaluation 03/08/23   Due to delay in scheduling w/pt's travel schedule   Authorization Type BCBS    PT Start Time 1537   Pt arrived late   PT Stop Time 1615    PT Time Calculation (min) 38 min    Activity Tolerance Patient tolerated treatment well    Behavior During Therapy WFL for tasks assessed/performed               Past Medical History:  Diagnosis Date   Anosmia    Hypertension    Obesity (BMI 30-39.9)    Vitreous hemorrhage ([place]) 09/2018   R   Past Surgical History:  Procedure Laterality Date   NECK SURGERY  ~ 2000   mass removed anteriorly, Dr Warren Danes   VASECTOMY  ~2000   Patient Active Problem List   Diagnosis Date Noted   Erectile dysfunction 08/15/2022   SVT (supraventricular tachycardia) 12/29/2021   Demand ischemia 12/29/2021   Hyperlipidemia 12/29/2021   Pulmonary nodules 12/29/2021   Precordial chest pain 12/28/2021   Anosmia 08/21/2018   Exercise-induced asthma 01/18/2017   Cervicogenic headache 01/18/2017   Snoring, OSA? 01/18/2017   Annual physical exam 07/09/2016   PCP NOTES >>>>>>>>>>>>>>>>> 04/18/2016   Hypertension    Obesity (BMI 30-39.9)     PCP: Colon Branch, MD  REFERRING PROVIDER: Pieter Partridge, DO  REFERRING DIAG: [phone] (ICD-10-CM) - Migraine without aura and without status migrainosus, not intractable M54.81 (ICD-10-CM) - Occipital neuralgia of right side  THERAPY DIAG:  Occipital neuralgia of right side  Muscle weakness (generalized)  Rationale for Evaluation and Treatment: Rehabilitation  ONSET DATE: 01/04/2023 (referral)  SUBJECTIVE:                                                                                                                                                                                                          SUBJECTIVE STATEMENT: Pt reports doing well. Just got off a flight from [place]. Had one headache Saturday night. Feels like his headaches are about the same. Is using the Chirp wheel at night which does help.   PERTINENT HISTORY:  In 2018, he reported onset of new headache 6-7/10 non-throbbing/pressure pain around 2016.  Starts at base of right side  of head and radiates to right temporal-parietal region.  He calls it a pressure pain.  No numbness or tingling.  He denies pain radiating down the neck or into the arm.  When he moves his neck, he feels "crunching".  No aura.  Associated bilateral blurred vision, photophobia, phonophobia and a little lightheaded.  No nausea.  At that time, he reported headaches lasted 12 hours and occurred 3 to 4 days a week.  No triggers or relieving factors.  Around the same time, he reported anosmia.  MRI of brain with and without contrast was performed on 09/26/16 to rule out mass lesion or other abnormality affecting the olfactory bulbs was personally reviewed and was normal.  Would treat with ibuprofen or Excedrin.  Tizanidine at night helped.  He was prescribed gabapentin but never started it as he was concerned about taking an anti-seizure medication.  He saw a spine surgeon who ordered X-rays as well as MRI which reportedly showed no structural cause for headaches.  I do not have these notes, images and patient does not remember who he saw.     He continued having headaches.  CT head on 06/20/2020 personally reviewed was normal.  By late 2022, they became frequent.  They would occur 10-20 days a month.  Sometimes he would be without headache for 5 days and then headaches would recur for several days.  He hasn't had a headache in 2 weeks.  He tried eletriptan, which aborts the headache quickly.  Prednisone taper was ineffective.  PAIN:  Are you having pain? No  PRECAUTIONS:  None  WEIGHT BEARING RESTRICTIONS: No  FALLS:  Has patient fallen in last 6 months? 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 Normal appearance of the brain.  No explanation for headache. 2. Clear olfactory recesses and unremarkable olfactory bulbs. No explanation for anosmia.  CT of Head from 05/2020  IMPRESSION: Normal head CT  PATIENT SURVEYS:  Headache Disability Index: 32/100 (moderate disability)   COGNITION: Overall cognitive status: Within functional limits for tasks assessed  SENSATION: WFL  POSTURE: rounded shoulders, forward head, increased thoracic kyphosis, and weight shift right  PALPATION: TTP on R upper trap and suboccipital region    CERVICAL ROM: Tested in seated position   Active ROM AROM (deg) eval AROM (Deg) 4/3  Flexion 39 41  Extension 56   Right lateral flexion 20 21  Left lateral flexion 25 25  Right rotation 57   Left rotation 49    (Blank rows = not tested)  UPPER EXTREMITY ROM: All WFL tested in seated position   Active ROM Right eval Left eval  Shoulder flexion    Shoulder extension    Shoulder abduction    Shoulder adduction    Shoulder extension    Shoulder internal rotation    Shoulder external rotation    Elbow flexion    Elbow extension    Wrist flexion    Wrist extension    Wrist ulnar deviation    Wrist radial deviation    Wrist pronation    Wrist  supination     (Blank rows = not tested)   TODAY'S TREATMENT:    Ther Act  STG Assessment  Assessed cervical ROM (see chart  above) in seated position  Ther Ex  Prone splenius capitis exercise (prone on elbows, looking back at ipsilateral back pocket and holding for 3-5s), x15 each side w/intermittent isometric stretch in between reps. Pt initially unable to achieve full ROM to look at back pocket but did improve mobility w/added reps.  Wall facing squats w/BUE abduction and flexion, x15 reps, for improved thoracic mobility and chest stretch. Pt performed well and was able to perform within 4" of wall.  Scorpions, x6 per side, for improved spinal mobility and chest stretch. Pt w/reduced ROM when rotating to R side.  Pigeon stretch (pt w/history of sciatica) for piriformis stretch and improved mobility in hips/lumbar spine, x30s per side.  Demonstrated quadruped thread the needles and spiderman lunges w/elbow taps as options for improved thoracic mobility as pt travels. Pt verbalized understanding.                                                                                                                             PATIENT EDUCATION:  Education details: Continue HEP, goal outcomes  Person educated: Patient Education method: Explanation, Demonstration, Verbal cues, and Handouts Education comprehension: verbalized understanding and needs further education  HOME EXERCISE PROGRAM: Access Code: [email or web address] URL: [email or web address] Date: 01/11/2023 Prepared by: Mickie Bail Pate Aylward  Exercises - Cervical Retraction at [place]  - 1 x daily - 7 x weekly - 3 sets - 10 reps - Single Arm Doorway Pec Stretch at 90 Degrees Abduction  - 1 x daily - 7 x weekly - 3 sets - 10 reps - Seated Windmill Trunk Rotation Stretch  - 1 x daily - 7 x weekly - 3 sets - 10 reps - Seated Upper Trapezius Stretch  - 1 x daily - 7 x weekly - 3 sets - 10 reps - Upper Trapezius Stretch  - 1 x daily - 7 x weekly - 3 sets - 10 reps - Wall Angels  - 1 x daily - 7 x weekly - 3 sets - 10 reps - Scapular Protraction at Wall  - 1 x daily - 7 x  weekly - 3 sets - 10 reps - Supine Suboccipital Release with Tennis Balls  - 1 x daily - 7 x weekly - 1 sets - 1 reps - 5-10 min hold - Supine Chin Tuck  - 1 x daily - 7 x weekly - 3 sets - 10 reps - Seated Assisted Cervical Rotation with Towel  - 1 x daily - 7 x weekly - 1 sets - 10 reps - 15-20 sec hold  ASSESSMENT:  CLINICAL IMPRESSION: Emphasis of skilled PT session on STG assessment, cervical ROM and spinal mobility. Pt has met 1 of 3 STGs, performing his HEP and mobility exercises regularly. Pt has not significantly improved his cervical ROM this date, but  had just gotten off of several hour flight when ROM was measured. Pt continues to be limited by hypomobility of thoracic spine, upper cross syndrome and suspected arthritis in cervical spine. Pt has responded well to TPDN and mobility exercises for cervical and thoracic spine and will continue to benefit from skilled PT services for reduced pain levels and headache frequency. Continue POC.    OBJECTIVE IMPAIRMENTS: decreased mobility, decreased ROM, decreased strength, postural dysfunction, and pain  ACTIVITY LIMITATIONS: sitting  PARTICIPATION LIMITATIONS: driving, community activity, and occupation  PERSONAL FACTORS: Fitness and Profession are also affecting patient's functional outcome.   REHAB POTENTIAL: Good  CLINICAL DECISION MAKING: Stable/uncomplicated  EVALUATION COMPLEXITY: Low   GOALS: Goals reviewed with patient? Yes  SHORT TERM GOALS: Target date: 02/01/2023  Pt will be independent with initial HEP for improved strength and mobility.  Baseline: Established on eval  Goal status: MET  2.  Pt will improve left and right cervical lateral flexion by >/= 10 degrees from baseline for improved functional ROM  Baseline: Left: 25 degrees; right: 20 degrees Goal status: NOT MET  3.  Pt will improve cervical flexion by >/= 11 degrees from baseline for improved paraspinal mobility  Baseline: 39 degrees Goal status: NOT  MET   LONG TERM GOALS: Target date: 02/22/2023   Pt will be independent with final HEP for improved strength and mobility   Baseline:  Goal status: INITIAL  2.  Pt will improve cervical flexion ROM by >/= 21 degrees from baseline for improved functional mobility  Baseline: 39 degrees Goal status: INITIAL  3.  Pt will improve HDI to < 20 for improved QOL and reduced headache frequency  Baseline: 32 (moderate disability) Goal status: INITIAL   PLAN:  PT FREQUENCY: 1x/week  PT DURATION: 6 weeks   PLANNED INTERVENTIONS: Therapeutic exercises, Therapeutic activity, Neuromuscular re-education, Balance training, Gait training, Patient/Family education, Self Care, Joint mobilization, Joint manipulation, Vestibular training, Canalith repositioning, Dry Needling, Manual therapy, and Re-evaluation  PLAN FOR NEXT SESSION: Dry needling to suboccipitals, how is HEP? Continue working on periscapular strength/mobility and ROM, serratus wall slides, prone snow angels   Domenique Southers E Pavlos Yon, PT, DPT 01/30/2023, 4:15 PM

## 2023-01-31 ENCOUNTER — Telehealth: Payer: Self-pay

## 2023-01-31 NOTE — Telephone Encounter (Signed)
Received PA form from [facility]- completed and faxed back to [phone]. Awaiting determination.

## 2023-01-31 NOTE — Telephone Encounter (Signed)
Further questions received via fax- completed and faxed back to [phone].

## 2023-02-01 NOTE — Telephone Encounter (Signed)
PA approved. Effective through 05/21/23.

## 2023-02-05 ENCOUNTER — Other Ambulatory Visit (HOSPITAL_COMMUNITY): Payer: Self-pay

## 2023-02-05 NOTE — Telephone Encounter (Signed)
Pt informed PA approved.

## 2023-02-08 ENCOUNTER — Ambulatory Visit: Payer: BC Managed Care – PPO | Admitting: Physical Therapy

## 2023-02-08 ENCOUNTER — Encounter: Payer: Self-pay | Admitting: Internal Medicine

## 2023-02-08 DIAGNOSIS — R293 Abnormal posture: Secondary | ICD-10-CM | POA: Diagnosis not present

## 2023-02-08 DIAGNOSIS — M6281 Muscle weakness (generalized): Secondary | ICD-10-CM

## 2023-02-08 DIAGNOSIS — M5481 Occipital neuralgia: Secondary | ICD-10-CM

## 2023-02-08 NOTE — Therapy (Signed)
OUTPATIENT PHYSICAL THERAPY CERVICAL TREATMENT   Patient Name: Christian Ramos MRN: [id number] DOB:12-Oct-1961, 62 y.o., male Today's Date: 02/08/2023  END OF SESSION:  PT End of Session - 02/08/23 1234     Visit Number 5    Number of Visits 7   Plus eval   Date for PT Re-Evaluation 03/08/23   Due to delay in scheduling w/pt's travel schedule   Authorization Type BCBS    PT Start Time 1235   pt arrived late   PT Stop Time 1310    PT Time Calculation (min) 35 min    Activity Tolerance Patient tolerated treatment well    Behavior During Therapy WFL for tasks assessed/performed                Past Medical History:  Diagnosis Date   Anosmia    Hypertension    Obesity (BMI 30-39.9)    Vitreous hemorrhage (HCC) 09/2018   R   Past Surgical History:  Procedure Laterality Date   NECK SURGERY  ~ 2000   mass removed anteriorly, Dr Cloria Spring   VASECTOMY  ~2000   Patient Active Problem List   Diagnosis Date Noted   Erectile dysfunction 08/15/2022   SVT (supraventricular tachycardia) 12/29/2021   Demand ischemia 12/29/2021   Hyperlipidemia 12/29/2021   Pulmonary nodules 12/29/2021   Precordial chest pain 12/28/2021   Anosmia 08/21/2018   Exercise-induced asthma 01/18/2017   Cervicogenic headache 01/18/2017   Snoring, OSA? 01/18/2017   Annual physical exam 07/09/2016   PCP NOTES >>>>>>>>>>>>>>>>> 04/18/2016   Hypertension    Obesity (BMI 30-39.9)     PCP: Wanda Plump, MD  REFERRING PROVIDER: Drema Dallas, DO  REFERRING DIAG: [phone] (ICD-10-CM) - Migraine without aura and without status migrainosus, not intractable M54.81 (ICD-10-CM) - Occipital neuralgia of right side  THERAPY DIAG:  Occipital neuralgia of right side  Muscle weakness (generalized)  Abnormal posture  Rationale for Evaluation and Treatment: Rehabilitation  ONSET DATE: 01/04/2023 (referral)  SUBJECTIVE:                                                                                                                                                                                                          SUBJECTIVE STATEMENT: Pt reports his pain has been doing good, no headaches this week. No pain right now. Pt reports he has been working on his exercises and using his Chirp wheel. Pt did his exercises this morning and they have been helpful.   PERTINENT HISTORY:  In 2018, he reported onset of new headache 6-7/10  non-throbbing/pressure pain around 2016.  Starts at base of right side of head and radiates to right temporal-parietal region.  He calls it a pressure pain.  No numbness or tingling.  He denies pain radiating down the neck or into the arm.  When he moves his neck, he feels "crunching".  No aura.  Associated bilateral blurred vision, photophobia, phonophobia and a little lightheaded.  No nausea.  At that time, he reported headaches lasted 12 hours and occurred 3 to 4 days a week.  No triggers or relieving factors.  Around the same time, he reported anosmia.  MRI of brain with and without contrast was performed on 09/26/16 to rule out mass lesion or other abnormality affecting the olfactory bulbs was personally reviewed and was normal.  Would treat with ibuprofen or Excedrin.  Tizanidine at night helped.  He was prescribed gabapentin but never started it as he was concerned about taking an anti-seizure medication.  He saw a spine surgeon who ordered X-rays as well as MRI which reportedly showed no structural cause for headaches.  I do not have these notes, images and patient does not remember who he saw.     He continued having headaches.  CT head on 06/20/2020 personally reviewed was normal.  By late 2022, they became frequent.  They would occur 10-20 days a month.  Sometimes he would be without headache for 5 days and then headaches would recur for several days.  He hasn't had a headache in 2 weeks.  He tried eletriptan, which aborts the headache quickly.  Prednisone taper was  ineffective.  PAIN:  Are you having pain? No  PRECAUTIONS: None  WEIGHT BEARING RESTRICTIONS: No  FALLS:  Has patient fallen in last 6 months? 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 Normal appearance of the brain.  No explanation for headache. 2. Clear olfactory recesses and unremarkable olfactory bulbs. No explanation for anosmia.  CT of Head from 05/2020  IMPRESSION: Normal head CT  PATIENT SURVEYS:  Headache Disability Index: 32/100 (moderate disability)   COGNITION: Overall cognitive status: Within functional limits for tasks assessed  SENSATION: WFL  POSTURE: rounded shoulders, forward head, increased thoracic kyphosis, and weight shift right  PALPATION: TTP on R upper trap and suboccipital region    CERVICAL ROM: Tested in seated position   Active ROM AROM (deg) eval AROM (Deg) 4/3  Flexion 39 41  Extension 56   Right lateral flexion 20 21  Left lateral flexion 25 25  Right rotation 57   Left rotation 49    (Blank rows = not tested)  UPPER EXTREMITY ROM: All WFL tested in seated position   Active ROM Right eval Left eval  Shoulder flexion    Shoulder extension    Shoulder abduction    Shoulder adduction    Shoulder extension    Shoulder internal rotation    Shoulder external rotation    Elbow flexion    Elbow extension    Wrist flexion    Wrist extension    Wrist ulnar deviation    Wrist  radial deviation    Wrist pronation    Wrist supination     (Blank rows = not tested)   TODAY'S TREATMENT:      Manual Therapy Suboccipital release 5 x 30 sec each Cervical distraction 3 x 30 sec each Cervical distraction with L lateral SB 5 x 30 sec each (decreased tightness following this)   STM to suboccipitals and splenius capitus following TPDN this session. Pt with decreased palpable tightness in these muscles following treatment this date.  Ther Act   Trigger Point Dry-Needling  Treatment instructions: Expect mild to moderate muscle soreness. S/S of pneumothorax if dry needled over a lung field, and to seek immediate medical attention should they occur. Patient verbalized understanding of these instructions and education.  Patient Consent Given: Yes Education handout provided: Previously provided Muscles treated: R suboccipitals, R splenius capitus Treatment response/outcome: deep ache/muscle cramp; muscle twitch detected; ongoing palpable TP and tightness in these areas but improved after treatment   Ther Ex  Supine thoracic mobilization with towel roll x 10 reps, feels a stretch at end range shoulder abduction Prone snow angels x 10 reps Wall slides/angels x 10 reps                                                                                                                  PATIENT EDUCATION:  Education details: Continue HEP, PT POC Person educated: Patient Education method: Medical illustrator Education comprehension: verbalized understanding and needs further education  HOME EXERCISE PROGRAM: Access Code: [email or web address] URL: [email or web address] Date: 01/11/2023 Prepared by: Alethia Berthold Plaster  Exercises - Cervical Retraction at Wall  - 1 x daily - 7 x weekly - 3 sets - 10 reps - Single Arm Doorway Pec Stretch at 90 Degrees Abduction  - 1 x daily - 7 x weekly - 3 sets - 10 reps - Seated Windmill Trunk Rotation Stretch  - 1 x daily - 7 x weekly - 3 sets - 10 reps - Seated Upper Trapezius Stretch  - 1 x daily - 7 x weekly - 3 sets - 10 reps - Upper  Trapezius Stretch  - 1 x daily - 7 x weekly - 3 sets - 10 reps - Wall Angels  - 1 x daily - 7 x weekly - 3 sets - 10 reps - Scapular Protraction at Wall  - 1 x daily - 7 x weekly - 3 sets - 10 reps - Supine Suboccipital Release with Tennis Balls  - 1 x daily - 7 x weekly - 1 sets - 1 reps - 5-10 min hold - Supine Chin Tuck  - 1 x daily - 7 x weekly - 3 sets - 10 reps - Seated Assisted Cervical Rotation with Towel  - 1 x daily - 7 x weekly - 1 sets - 10 reps - 15-20 sec hold  ASSESSMENT:  CLINICAL IMPRESSION: Emphasis of skilled PT session on performing TPDN, manual therapy, and stretching to address ongoing pain and tightness leading to headaches. Pt with overall improvement in  symptoms over the past week with performance of HEP. Pt with significant tightness in R suboccipitals and R splenius capitus muscles, some relief of tightness with TPDN and manual therapy. Pt continues to benefit from skilled therapy services to work towards reducing his pain and headaches and improve overall function. Continue POC.    OBJECTIVE IMPAIRMENTS: decreased mobility, decreased ROM, decreased strength, postural dysfunction, and pain  ACTIVITY LIMITATIONS: sitting  PARTICIPATION LIMITATIONS: driving, community activity, and occupation  PERSONAL FACTORS: Fitness and Profession are also affecting patient's functional outcome.   REHAB POTENTIAL: Good  CLINICAL DECISION MAKING: Stable/uncomplicated  EVALUATION COMPLEXITY: Low   GOALS: Goals reviewed with patient? Yes  SHORT TERM GOALS: Target date: 02/01/2023  Pt will be independent with initial HEP for improved strength and mobility.  Baseline: Established on eval  Goal status: MET  2.  Pt will improve left and right cervical lateral flexion by >/= 10 degrees from baseline for improved functional ROM  Baseline: Left: 25 degrees; right: 20 degrees Goal status: NOT MET  3.  Pt will improve cervical flexion by >/= 11 degrees from baseline for  improved paraspinal mobility  Baseline: 39 degrees Goal status: NOT MET   LONG TERM GOALS: Target date: 02/22/2023   Pt will be independent with final HEP for improved strength and mobility   Baseline:  Goal status: INITIAL  2.  Pt will improve cervical flexion ROM by >/= 21 degrees from baseline for improved functional mobility  Baseline: 39 degrees Goal status: INITIAL  3.  Pt will improve HDI to < 20 for improved QOL and reduced headache frequency  Baseline: 32 (moderate disability) Goal status: INITIAL   PLAN:  PT FREQUENCY: 1x/week  PT DURATION: 6 weeks   PLANNED INTERVENTIONS: Therapeutic exercises, Therapeutic activity, Neuromuscular re-education, Balance training, Gait training, Patient/Family education, Self Care, Joint mobilization, Joint manipulation, Vestibular training, Canalith repositioning, Dry Needling, Manual therapy, and Re-evaluation  PLAN FOR NEXT SESSION: Dry needling to suboccipitals and splenius capitus, how is HEP? Continue working on periscapular strength/mobility and ROM, serratus wall slides, prone snow angels, manual therapy to suboccipitals and splenius   Peter Congo, PT, DPT, CSRS 02/08/2023, 1:11 PM

## 2023-02-13 ENCOUNTER — Ambulatory Visit: Payer: BC Managed Care – PPO | Admitting: Physical Therapy

## 2023-02-17 ENCOUNTER — Encounter: Payer: Self-pay | Admitting: Internal Medicine

## 2023-02-17 DIAGNOSIS — M5410 Radiculopathy, site unspecified: Secondary | ICD-10-CM

## 2023-02-17 DIAGNOSIS — M25551 Pain in right hip: Secondary | ICD-10-CM

## 2023-02-20 ENCOUNTER — Ambulatory Visit: Payer: BC Managed Care – PPO | Admitting: Physical Therapy

## 2023-02-20 DIAGNOSIS — M5481 Occipital neuralgia: Secondary | ICD-10-CM

## 2023-02-20 DIAGNOSIS — M6281 Muscle weakness (generalized): Secondary | ICD-10-CM

## 2023-02-20 DIAGNOSIS — R293 Abnormal posture: Secondary | ICD-10-CM | POA: Diagnosis not present

## 2023-02-20 NOTE — Therapy (Signed)
OUTPATIENT PHYSICAL THERAPY CERVICAL TREATMENT   Patient Name: Christian Ramos MRN: [id number] DOB:Sep 08, 1961, 62 y.o., male Today's Date: 02/20/2023  END OF SESSION:  PT End of Session - 02/20/23 1544     Visit Number 6    Number of Visits 7   Plus eval   Date for PT Re-Evaluation 03/08/23   Due to delay in scheduling w/pt's travel schedule   Authorization Type BCBS    PT Start Time 1542   Pt arrived late   PT Stop Time 1625    PT Time Calculation (min) 43 min    Activity Tolerance Patient tolerated treatment well    Behavior During Therapy [facility] for tasks assessed/performed                Past Medical History:  Diagnosis Date   Anosmia    Hypertension    Obesity (BMI 30-39.9)    Vitreous hemorrhage (HCC) 09/2018   R   Past Surgical History:  Procedure Laterality Date   NECK SURGERY  ~ 2000   mass removed anteriorly, Dr Cloria Spring   VASECTOMY  ~2000   Patient Active Problem List   Diagnosis Date Noted   Erectile dysfunction 08/15/2022   SVT (supraventricular tachycardia) 12/29/2021   Demand ischemia 12/29/2021   Hyperlipidemia 12/29/2021   Pulmonary nodules 12/29/2021   Precordial chest pain 12/28/2021   Anosmia 08/21/2018   Exercise-induced asthma 01/18/2017   Cervicogenic headache 01/18/2017   Snoring, OSA? 01/18/2017   Annual physical exam 07/09/2016   PCP NOTES >>>>>>>>>>>>>>>>> 04/18/2016   Hypertension    Obesity (BMI 30-39.9)     PCP: Wanda Plump, MD  REFERRING PROVIDER: Drema Dallas, DO  REFERRING DIAG: [phone] (ICD-10-CM) - Migraine without aura and without status migrainosus, not intractable M54.81 (ICD-10-CM) - Occipital neuralgia of right side  THERAPY DIAG:  Occipital neuralgia of right side  Muscle weakness (generalized)  Rationale for Evaluation and Treatment: Rehabilitation  ONSET DATE: 01/04/2023 (referral)  SUBJECTIVE:                                                                                                                                                                                                          SUBJECTIVE STATEMENT: Pt reports he is doing well, has only had one headache in the past few weeks. Reports he has hip pain that gets worse w/walking and at night.   PERTINENT HISTORY:  In 2018, he reported onset of new headache 6-7/10 non-throbbing/pressure pain around 2016.  Starts at base of right side of head and radiates to  right temporal-parietal region.  He calls it a pressure pain.  No numbness or tingling.  He denies pain radiating down the neck or into the arm.  When he moves his neck, he feels "crunching".  No aura.  Associated bilateral blurred vision, photophobia, phonophobia and a little lightheaded.  No nausea.  At that time, he reported headaches lasted 12 hours and occurred 3 to 4 days a week.  No triggers or relieving factors.  Around the same time, he reported anosmia.  MRI of brain with and without contrast was performed on 09/26/16 to rule out mass lesion or other abnormality affecting the olfactory bulbs was personally reviewed and was normal.  Would treat with ibuprofen or Excedrin.  Tizanidine at night helped.  He was prescribed gabapentin but never started it as he was concerned about taking an anti-seizure medication.  He saw a spine surgeon who ordered X-rays as well as MRI which reportedly showed no structural cause for headaches.  I do not have these notes, images and patient does not remember who he saw.     He continued having headaches.  CT head on 06/20/2020 personally reviewed was normal.  By late 2022, they became frequent.  They would occur 10-20 days a month.  Sometimes he would be without headache for 5 days and then headaches would recur for several days.  He hasn't had a headache in 2 weeks.  He tried eletriptan, which aborts the headache quickly.  Prednisone taper was ineffective.  PAIN:  Are you having pain? No  PRECAUTIONS: None  WEIGHT BEARING RESTRICTIONS: No  FALLS:   Has patient fallen in last 6 months? 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 Normal appearance of the brain.  No explanation for headache. 2. Clear olfactory recesses and unremarkable olfactory bulbs. No explanation for anosmia.  CT of Head from 05/2020  IMPRESSION: Normal head CT  PATIENT SURVEYS:  Headache Disability Index: 32/100 (moderate disability)   COGNITION: Overall cognitive status: Within functional limits for tasks assessed  SENSATION: WFL  POSTURE: rounded shoulders, forward head, increased thoracic kyphosis, and weight shift right  PALPATION: TTP on R upper trap and suboccipital region    CERVICAL ROM: Tested in seated position   Active ROM AROM (deg) eval AROM (Deg) 4/3  Flexion 39 41  Extension 56   Right lateral flexion 20 21  Left lateral flexion 25 25  Right rotation 57   Left rotation 49    (Blank rows = not tested)  UPPER EXTREMITY ROM: All WFL tested in seated position   Active ROM Right eval Left eval  Shoulder flexion    Shoulder extension    Shoulder abduction    Shoulder adduction    Shoulder extension    Shoulder internal rotation    Shoulder external rotation    Elbow flexion    Elbow extension    Wrist flexion    Wrist extension    Wrist ulnar deviation    Wrist radial deviation    Wrist pronation    Wrist supination     (  Blank rows = not tested)   TODAY'S TREATMENT:    Ther Ex  Observed AROM of thoracic and lumbar spine in standing, w/no report of pain w/any movement. Did not  formally measure ROM.  The following exercises were performed for improved spinal mobility and pain modulation, from cervical to lumbar spine:  Supine iron crosses, x5 per side w/emphasis on maintaining shoulder contact on mat for facilitation of upper thoracic rotation, as pt very limited here which is likely contributing to neck pain   Quadruped cat cows, x8. Pt w/more difficulty performing cow than cat due to increased kyphosis of thoracic spine Pigeon stretch, x3 minutes per side Seated 90-90 hip mobility for upright posture, hip IR/ER and thoracic rotation. Pt much more limited on R side > L side w/IR  Discussed likelihood that pt's hip pain is coming from low back, as his pain follows L4/L5 dermatomal pattern. Pt to see orthopedic soon about this.                                                                                                                   PATIENT EDUCATION:  Education details: Updates to HEP  Person educated: Patient Education method: Medical illustrator Education comprehension: verbalized understanding and needs further education  HOME EXERCISE PROGRAM: Access Code: [email or web address] URL: [email or web address] Date: 01/11/2023 Prepared by: Alethia Berthold Shawon Denzer  Exercises - Cervical Retraction at Wall  - 1 x daily - 7 x weekly - 3 sets - 10 reps - Single Arm Doorway Pec Stretch at 90 Degrees Abduction  - 1 x daily - 7 x weekly - 3 sets - 10 reps - Seated Windmill Trunk Rotation Stretch  - 1 x daily - 7 x weekly - 3 sets - 10 reps - Seated Upper Trapezius Stretch  - 1 x daily - 7 x weekly - 3 sets - 10 reps - Upper Trapezius Stretch  - 1 x daily - 7 x weekly - 3 sets - 10 reps - Wall Angels  - 1 x daily - 7 x weekly - 3 sets - 10 reps - Scapular Protraction at Wall  - 1 x daily - 7 x weekly - 3 sets - 10 reps - Supine Suboccipital Release with Tennis Balls  - 1 x daily - 7 x weekly - 1 sets - 1 reps - 5-10 min hold - Supine Chin Tuck  - 1 x daily - 7 x  weekly - 3 sets - 10 reps - Seated Assisted Cervical Rotation with Towel  - 1 x daily - 7 x weekly - 1 sets - 10 reps - 15-20 sec hold  Verbally added:  Pigeon stretch  90-90 hip stretch Iron crosses  ASSESSMENT:  CLINICAL IMPRESSION: Emphasis of skilled PT session on improved spinal mobility for reduced pain levels and overall improved functional strength. Pt very rigid in upper thoracic spine and has difficulty properly activating his periscapular musculature. As a result, pt overcompensates w/use of cervical and lumbar mobility, likely contributing to pt's splenius capitis pain  and recurrent "hip" pain. Pt had significant difficulty w/rotational mobility of spine and reported feeling stretch in both neck and low back w/movements. Pt in agreement to add one more visit to have TPDN performed to splenius capitis and will decide whether he would like to recert or DC next visit. Continue POC.    OBJECTIVE IMPAIRMENTS: decreased mobility, decreased ROM, decreased strength, postural dysfunction, and pain  ACTIVITY LIMITATIONS: sitting  PARTICIPATION LIMITATIONS: driving, community activity, and occupation  PERSONAL FACTORS: Fitness and Profession are also affecting patient's functional outcome.   REHAB POTENTIAL: Good  CLINICAL DECISION MAKING: Stable/uncomplicated  EVALUATION COMPLEXITY: Low   GOALS: Goals reviewed with patient? Yes  SHORT TERM GOALS: Target date: 02/01/2023  Pt will be independent with initial HEP for improved strength and mobility.  Baseline: Established on eval  Goal status: MET  2.  Pt will improve left and right cervical lateral flexion by >/= 10 degrees from baseline for improved functional ROM  Baseline: Left: 25 degrees; right: 20 degrees Goal status: NOT MET  3.  Pt will improve cervical flexion by >/= 11 degrees from baseline for improved paraspinal mobility  Baseline: 39 degrees Goal status: NOT MET   LONG TERM GOALS: Target date:  02/22/2023   Pt will be independent with final HEP for improved strength and mobility   Baseline:  Goal status: INITIAL  2.  Pt will improve cervical flexion ROM by >/= 21 degrees from baseline for improved functional mobility  Baseline: 39 degrees Goal status: INITIAL  3.  Pt will improve HDI to < 20 for improved QOL and reduced headache frequency  Baseline: 32 (moderate disability) Goal status: INITIAL   PLAN:  PT FREQUENCY: 1x/week  PT DURATION: 6 weeks   PLANNED INTERVENTIONS: Therapeutic exercises, Therapeutic activity, Neuromuscular re-education, Balance training, Gait training, Patient/Family education, Self Care, Joint mobilization, Joint manipulation, Vestibular training, Canalith repositioning, Dry Needling, Manual therapy, and Re-evaluation  PLAN FOR NEXT SESSION: Goal assessment. DC vs recert. Dry needling to suboccipitals and splenius capitus, how is HEP? Continue working on periscapular strength/mobility and ROM, serratus wall slides, prone snow angels, manual therapy to suboccipitals and splenius   Dshaun Reppucci E Matheo Rathbone, PT, DPT  02/20/2023, 4:26 PM

## 2023-02-26 DIAGNOSIS — M25551 Pain in right hip: Secondary | ICD-10-CM | POA: Diagnosis not present

## 2023-02-26 NOTE — Progress Notes (Signed)
Pt not evaluated; no show.

## 2023-02-28 ENCOUNTER — Ambulatory Visit: Payer: BC Managed Care – PPO | Attending: Neurology | Admitting: Physical Therapy

## 2023-02-28 DIAGNOSIS — M5481 Occipital neuralgia: Secondary | ICD-10-CM | POA: Diagnosis not present

## 2023-02-28 DIAGNOSIS — M6281 Muscle weakness (generalized): Secondary | ICD-10-CM | POA: Diagnosis not present

## 2023-02-28 DIAGNOSIS — R293 Abnormal posture: Secondary | ICD-10-CM

## 2023-02-28 NOTE — Therapy (Signed)
OUTPATIENT PHYSICAL THERAPY CERVICAL TREATMENT- DISCHARGE NOTE   Patient Name: Christian Ramos MRN: [id number] DOB:[birth year], 62 y.o., male Today's Date: 02/28/2023   PHYSICAL THERAPY DISCHARGE SUMMARY  Visits from Start of Care: 7  Current functional level related to goals / functional outcomes: Independent   Remaining deficits: Decreased cervical ROM but significantly improved from initial evaluation   Education / Equipment: Handout for HEP   Patient agrees to discharge. Patient goals were partially met. Patient is being discharged due to being pleased with the current functional level.   END OF SESSION:  PT End of Session - 02/28/23 1622     Visit Number 7    Number of Visits 7   Plus eval   Date for PT Re-Evaluation 03/08/23   Due to delay in scheduling w/pt's travel schedule   Authorization Type [employer]    PT Start Time 1620   pt arrived late   PT Stop Time 1658    PT Time Calculation (min) 38 min    Activity Tolerance Patient tolerated treatment well    Behavior During Therapy WFL for tasks assessed/performed                 Past Medical History:  Diagnosis Date   Anosmia    Hypertension    Obesity (BMI 30-39.9)    Vitreous hemorrhage (HCC) 09/2018   R   Past Surgical History:  Procedure Laterality Date   NECK SURGERY  ~ 2000   mass removed anteriorly, Dr Cloria Spring   VASECTOMY  ~2000   Patient Active Problem List   Diagnosis Date Noted   Erectile dysfunction 08/15/2022   SVT (supraventricular tachycardia) 12/29/2021   Demand ischemia 12/29/2021   Hyperlipidemia 12/29/2021   Pulmonary nodules 12/29/2021   Precordial chest pain 12/28/2021   Anosmia 08/21/2018   Exercise-induced asthma 01/18/2017   Cervicogenic headache 01/18/2017   Snoring, OSA? 01/18/2017   Annual physical exam 07/09/2016   PCP NOTES >>>>>>>>>>>>>>>>> 04/18/2016   Hypertension    Obesity (BMI 30-39.9)     PCP: Wanda Plump, MD  REFERRING PROVIDER: Drema Dallas,  DO  REFERRING DIAG: [phone] (ICD-10-CM) - Migraine without aura and without status migrainosus, not intractable M54.81 (ICD-10-CM) - Occipital neuralgia of right side  THERAPY DIAG:  Occipital neuralgia of right side  Abnormal posture  Muscle weakness (generalized)  Rationale for Evaluation and Treatment: Rehabilitation  ONSET DATE: 01/04/2023 (referral)  SUBJECTIVE:  SUBJECTIVE STATEMENT: Pt reports his neck pain/headaches have been much better. Pt has not taken a migraine pill in 3 weeks.   Pt did see an orthopedic doctor regarding his R hip pain, determined that hip was not the cause of the pain and more likely his low back. Pt going to be referred to a spine doctor then ask for referral back to this clinic to address low back pain and symptoms down into RLE. Pt agreeable to d/c from PT for his occipital neuralgia this session.  Pt reports that since last visit he has stood on concrete x 12 hours, played pickleball, walked/jogged in his neighborhood, and cleaned out a storage unit. Pt does not have his R "hip" pain with any activity but feels the pain at the end of the day. Pt reports his pain is relief by laying down or certain leg/hip stretches.   PERTINENT HISTORY:  In 2018, he reported onset of new headache 6-7/10 non-throbbing/pressure pain around 2016.  Starts at base of right side of head and radiates to right temporal-parietal region.  He calls it a pressure pain.  No numbness or tingling.  He denies pain radiating down the neck or into the arm.  When he moves his neck, he feels "crunching".  No aura.  Associated bilateral blurred vision, photophobia, phonophobia and a little lightheaded.  No nausea.  At that time, he reported headaches lasted 12 hours and occurred 3 to 4 days a week.   No triggers or relieving factors.  Around the same time, he reported anosmia.  MRI of brain with and without contrast was performed on 09/26/16 to rule out mass lesion or other abnormality affecting the olfactory bulbs was personally reviewed and was normal.  Would treat with ibuprofen or Excedrin.  Tizanidine at night helped.  He was prescribed gabapentin but never started it as he was concerned about taking an anti-seizure medication.  He saw a spine surgeon who ordered X-rays as well as MRI which reportedly showed no structural cause for headaches.  I do not have these notes, images and patient does not remember who he saw.     He continued having headaches.  CT head on 06/20/2020 personally reviewed was normal.  By late 2022, they became frequent.  They would occur 10-20 days a month.  Sometimes he would be without headache for 5 days and then headaches would recur for several days.  He hasn't had a headache in 2 weeks.  He tried eletriptan, which aborts the headache quickly.  Prednisone taper was ineffective.  PAIN:  Are you having pain? No  PRECAUTIONS: None  WEIGHT BEARING RESTRICTIONS: No  FALLS:  Has patient fallen in last 6 months? 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 Normal appearance of the brain.  No explanation for headache. 2. Clear olfactory recesses and unremarkable olfactory bulbs. No explanation for anosmia.  CT of Head from 05/2020  IMPRESSION: Normal head CT  PATIENT SURVEYS:  Headache Disability Index: 32/100 (moderate  disability)   COGNITION: Overall cognitive status: Within functional limits for tasks assessed  SENSATION: WFL  POSTURE: rounded shoulders, forward head, increased thoracic kyphosis, and weight shift right  PALPATION: TTP on R upper trap and suboccipital region    CERVICAL ROM: Tested in seated position   Active ROM AROM (deg) eval AROM (Deg) 4/3 AROM (deg) 5/2  Flexion 39 41 45  Extension 56  60  Right lateral flexion 20 21 35  Left lateral flexion 25 25 45  Right rotation 57  75  Left rotation 49  70   (Blank rows = not tested)  UPPER EXTREMITY ROM: All WFL tested in seated position   Active ROM Right eval Left eval  Shoulder flexion    Shoulder extension    Shoulder abduction    Shoulder adduction    Shoulder extension    Shoulder internal rotation    Shoulder external rotation    Elbow flexion    Elbow extension    Wrist flexion    Wrist extension    Wrist ulnar deviation    Wrist radial deviation    Wrist pronation    Wrist supination     (Blank rows = not tested)   TODAY'S TREATMENT:    Ther Ex  Supine LTR x 10 reps each direction Supine posterior pelvic tilts x 10 reps with 5 sec hold  Added to HEP, see bolded below  TherAct Reassessed HDI: 20/100 Slump Test: negative Reassessed cervical AROM, see above  Trigger Point Dry-Needling  Treatment instructions: Expect mild to moderate muscle soreness. S/S of pneumothorax if dry needled over a lung field, and to seek immediate medical attention should they occur. Patient verbalized understanding of these instructions and education.  Patient Consent Given: Yes Education handout provided: Previously provided Muscles treated: R suboccipitals, R splenius capitus Treatment response/outcome: deep ache/muscle cramp; muscle twitch detected; decrease in size of palpable trigger point                                                                                                                   PATIENT  EDUCATION:  Education details: Updates to HEP, continue with HEP, PT POC (d/c this session, obtain new referral to address lumbar spine) Person educated: Patient Education method: Explanation, Demonstration, and Handouts Education comprehension: verbalized understanding and returned demonstration  HOME EXERCISE PROGRAM: Access Code: [email or web address] URL: [email or web address] Date: 01/11/2023 Prepared by: Alethia Berthold Plaster  Exercises - Cervical Retraction at Wall  - 1 x daily - 7 x weekly - 3 sets - 10 reps - Single Arm Doorway Pec Stretch at 90 Degrees Abduction  - 1 x daily - 7 x weekly - 3 sets - 10 reps - Seated Windmill Trunk Rotation Stretch  - 1 x daily - 7 x weekly - 3 sets - 10 reps - Seated Upper Trapezius Stretch  - 1 x daily - 7 x weekly - 3 sets - 10 reps - Upper Trapezius Stretch  - 1 x daily - 7 x weekly - 3 sets - 10 reps - Wall Angels  - 1 x daily - 7  x weekly - 3 sets - 10 reps - Scapular Protraction at Wall  - 1 x daily - 7 x weekly - 3 sets - 10 reps - Supine Suboccipital Release with Tennis Balls  - 1 x daily - 7 x weekly - 1 sets - 1 reps - 5-10 min hold - Supine Chin Tuck  - 1 x daily - 7 x weekly - 3 sets - 10 reps - Seated Assisted Cervical Rotation with Towel  - 1 x daily - 7 x weekly - 1 sets - 10 reps - 15-20 sec hold - Supine Lower Trunk Rotation  - 1 x daily - 7 x weekly - 3 sets - 10 reps - Supine Posterior Pelvic Tilt  - 1 x daily - 7 x weekly - 3 sets - 10 reps - 5 sec hold  Verbally added:  Pigeon stretch  90-90 hip stretch Iron crosses  ASSESSMENT:  CLINICAL IMPRESSION: Emphasis of skilled PT session on assessing LTG in preparation for d/c from OPPT services this date. Pt has met 2/3 LTG due to being independent with his final cervical HEP and decreasing his headaches/migraines and disability level by improving his score on the HDI from 32 initially to 20 this date. Pt did improve cervical ROM in all directions but did not improve cervical  flexion enough to meet goal. Pt agreeable to d/c from OPPT services this date as he is able to self-manage cervical pain/symptoms and headaches independently at this point. Pt would benefit from a new referral to PT to address R hip and lumbar pain and symptoms down into his RLE. Pt to obtain referral after seeing spinal doctor and will return to PT at that time for a new evaluation.    OBJECTIVE IMPAIRMENTS: decreased mobility, decreased ROM, decreased strength, postural dysfunction, and pain  ACTIVITY LIMITATIONS: sitting  PARTICIPATION LIMITATIONS: driving, community activity, and occupation  PERSONAL FACTORS: Fitness and Profession are also affecting patient's functional outcome.   REHAB POTENTIAL: Good  CLINICAL DECISION MAKING: Stable/uncomplicated  EVALUATION COMPLEXITY: Low   GOALS: Goals reviewed with patient? Yes  SHORT TERM GOALS: Target date: 02/01/2023  Pt will be independent with initial HEP for improved strength and mobility. Baseline: Established on eval  Goal status: MET  2.  Pt will improve left and right cervical lateral flexion by >/= 10 degrees from baseline for improved functional ROM  Baseline: Left: 25 degrees; right: 20 degrees Goal status: NOT MET  3.  Pt will improve cervical flexion by >/= 11 degrees from baseline for improved paraspinal mobility  Baseline: 39 degrees Goal status: NOT MET   LONG TERM GOALS: Target date: 02/22/2023  Pt will be independent with final HEP for improved strength and mobility  Baseline:  Goal status: MET  2.  Pt will improve cervical flexion ROM by >/= 21 degrees from baseline for improved functional mobility  Baseline: 39 degrees, 45 degrees (5/2) Goal status: NOT MET  3.  Pt will improve HDI to < 20 for improved QOL and reduced headache frequency  Baseline: 32 (moderate disability), 20 (5/2) Goal status: MET      Peter Congo, PT, DPT, CSRS  02/28/2023, 4:59 PM

## 2023-03-01 NOTE — Telephone Encounter (Signed)
Referral placed.

## 2023-03-01 NOTE — Telephone Encounter (Signed)
Refer to orthopedic doctor that does spine care

## 2023-03-01 NOTE — Addendum Note (Signed)
Addended by: Conrad [place] D on: 03/01/2023 10:15 AM   Modules accepted: Orders

## 2023-03-13 DIAGNOSIS — D225 Melanocytic nevi of trunk: Secondary | ICD-10-CM | POA: Diagnosis not present

## 2023-03-13 DIAGNOSIS — L821 Other seborrheic keratosis: Secondary | ICD-10-CM | POA: Diagnosis not present

## 2023-03-13 DIAGNOSIS — L570 Actinic keratosis: Secondary | ICD-10-CM | POA: Diagnosis not present

## 2023-03-13 DIAGNOSIS — Z85828 Personal history of other malignant neoplasm of skin: Secondary | ICD-10-CM | POA: Diagnosis not present

## 2023-03-13 DIAGNOSIS — B354 Tinea corporis: Secondary | ICD-10-CM | POA: Diagnosis not present

## 2023-04-17 ENCOUNTER — Encounter: Payer: Self-pay | Admitting: Internal Medicine

## 2023-05-01 ENCOUNTER — Other Ambulatory Visit (HOSPITAL_COMMUNITY): Payer: Self-pay

## 2023-05-09 ENCOUNTER — Other Ambulatory Visit (HOSPITAL_COMMUNITY): Payer: Self-pay

## 2023-05-09 NOTE — Telephone Encounter (Signed)
This has already been approved and documented in separate encounter.

## 2023-05-14 DIAGNOSIS — M545 Low back pain, unspecified: Secondary | ICD-10-CM | POA: Diagnosis not present

## 2023-05-14 DIAGNOSIS — G8929 Other chronic pain: Secondary | ICD-10-CM | POA: Diagnosis not present

## 2023-05-14 DIAGNOSIS — M25551 Pain in right hip: Secondary | ICD-10-CM | POA: Diagnosis not present

## 2023-05-14 DIAGNOSIS — M5417 Radiculopathy, lumbosacral region: Secondary | ICD-10-CM | POA: Diagnosis not present

## 2023-06-11 DIAGNOSIS — M5117 Intervertebral disc disorders with radiculopathy, lumbosacral region: Secondary | ICD-10-CM | POA: Diagnosis not present

## 2023-06-11 DIAGNOSIS — M4316 Spondylolisthesis, lumbar region: Secondary | ICD-10-CM | POA: Diagnosis not present

## 2023-06-11 DIAGNOSIS — M4726 Other spondylosis with radiculopathy, lumbar region: Secondary | ICD-10-CM | POA: Diagnosis not present

## 2023-06-11 DIAGNOSIS — M5417 Radiculopathy, lumbosacral region: Secondary | ICD-10-CM | POA: Diagnosis not present

## 2023-06-11 DIAGNOSIS — M4727 Other spondylosis with radiculopathy, lumbosacral region: Secondary | ICD-10-CM | POA: Diagnosis not present

## 2023-06-24 DIAGNOSIS — M5116 Intervertebral disc disorders with radiculopathy, lumbar region: Secondary | ICD-10-CM | POA: Diagnosis not present

## 2023-06-24 DIAGNOSIS — M5416 Radiculopathy, lumbar region: Secondary | ICD-10-CM | POA: Diagnosis not present

## 2023-06-24 DIAGNOSIS — M7918 Myalgia, other site: Secondary | ICD-10-CM | POA: Diagnosis not present

## 2023-06-24 DIAGNOSIS — M545 Low back pain, unspecified: Secondary | ICD-10-CM | POA: Diagnosis not present

## 2023-06-24 DIAGNOSIS — M4726 Other spondylosis with radiculopathy, lumbar region: Secondary | ICD-10-CM | POA: Diagnosis not present

## 2023-06-24 DIAGNOSIS — G894 Chronic pain syndrome: Secondary | ICD-10-CM | POA: Diagnosis not present

## 2023-06-24 DIAGNOSIS — M533 Sacrococcygeal disorders, not elsewhere classified: Secondary | ICD-10-CM | POA: Diagnosis not present

## 2023-06-24 DIAGNOSIS — M7060 Trochanteric bursitis, unspecified hip: Secondary | ICD-10-CM | POA: Diagnosis not present

## 2023-06-24 DIAGNOSIS — M461 Sacroiliitis, not elsewhere classified: Secondary | ICD-10-CM | POA: Diagnosis not present

## 2023-06-26 ENCOUNTER — Encounter: Payer: Self-pay | Admitting: Internal Medicine

## 2023-06-26 ENCOUNTER — Telehealth: Payer: Self-pay | Admitting: Internal Medicine

## 2023-06-26 MED ORDER — WEGOVY 2.4 MG/0.75ML ~~LOC~~ SOAJ: 2.4000 mg | SUBCUTANEOUS | 0 refills | Status: DC

## 2023-06-26 NOTE — Addendum Note (Signed)
Addended byConrad [place] D on: 06/26/2023 10:40 AM   Modules accepted: Orders

## 2023-06-26 NOTE — Telephone Encounter (Signed)
Rx sent 

## 2023-06-26 NOTE — Telephone Encounter (Signed)
Pt was told to schedule appt for med refills but no appts available that works with patient's schedule and he will be out of town from tomorrow (8/29) until 07/08/23 and is out of the medication. Please advise pt if able to send in a refill to get him through until he gets back and can see Dr. Drue Novel on 07/09/23. Pt would like refill sent to [facility] on [address].

## 2023-07-03 ENCOUNTER — Telehealth: Payer: Self-pay | Admitting: Internal Medicine

## 2023-07-03 MED ORDER — WEGOVY 2.4 MG/0.75ML ~~LOC~~ SOAJ: 2.4000 mg | SUBCUTANEOUS | 0 refills | Status: DC

## 2023-07-03 NOTE — Addendum Note (Signed)
Addended byConrad [place] D on: 07/03/2023 04:12 PM   Modules accepted: Orders

## 2023-07-03 NOTE — Telephone Encounter (Signed)
Victorino Dike from [facility] called & stated that they need a new script sent in for the pt for Wegovy due to the pt's plan changing & that it is now being covered through [facility]. Tele # is [phone]. Fax : [phone].  Additionally, Victorino Dike stated that the pt's prior auth for the medication has also expired & she will be faxing a new one by tomorrow.    Please send new prescription to [facility].

## 2023-07-03 NOTE — Telephone Encounter (Signed)
Rx sent 

## 2023-07-09 ENCOUNTER — Encounter: Payer: Self-pay | Admitting: Internal Medicine

## 2023-07-09 ENCOUNTER — Ambulatory Visit: Payer: BC Managed Care – PPO | Admitting: Internal Medicine

## 2023-07-09 ENCOUNTER — Telehealth: Payer: Self-pay

## 2023-07-09 VITALS — BP 130/80 | HR 81 | Temp 97.8°F | Resp 16 | Ht 68.0 in | Wt 228.5 lb

## 2023-07-09 DIAGNOSIS — E669 Obesity, unspecified: Secondary | ICD-10-CM | POA: Diagnosis not present

## 2023-07-09 DIAGNOSIS — I10 Essential (primary) hypertension: Secondary | ICD-10-CM | POA: Diagnosis not present

## 2023-07-09 DIAGNOSIS — M48061 Spinal stenosis, lumbar region without neurogenic claudication: Secondary | ICD-10-CM | POA: Diagnosis not present

## 2023-07-09 DIAGNOSIS — M4317 Spondylolisthesis, lumbosacral region: Secondary | ICD-10-CM | POA: Diagnosis not present

## 2023-07-09 DIAGNOSIS — Z0000 Encounter for general adult medical examination without abnormal findings: Secondary | ICD-10-CM

## 2023-07-09 DIAGNOSIS — M4727 Other spondylosis with radiculopathy, lumbosacral region: Secondary | ICD-10-CM | POA: Diagnosis not present

## 2023-07-09 DIAGNOSIS — M5417 Radiculopathy, lumbosacral region: Secondary | ICD-10-CM | POA: Diagnosis not present

## 2023-07-09 DIAGNOSIS — M5116 Intervertebral disc disorders with radiculopathy, lumbar region: Secondary | ICD-10-CM | POA: Diagnosis not present

## 2023-07-09 DIAGNOSIS — E785 Hyperlipidemia, unspecified: Secondary | ICD-10-CM | POA: Diagnosis not present

## 2023-07-09 DIAGNOSIS — M4306 Spondylolysis, lumbar region: Secondary | ICD-10-CM | POA: Diagnosis not present

## 2023-07-09 DIAGNOSIS — M4726 Other spondylosis with radiculopathy, lumbar region: Secondary | ICD-10-CM | POA: Diagnosis not present

## 2023-07-09 DIAGNOSIS — G894 Chronic pain syndrome: Secondary | ICD-10-CM | POA: Diagnosis not present

## 2023-07-09 LAB — COMPREHENSIVE METABOLIC PANEL
ALT: 19 U/L (ref 0–53)
AST: 17 U/L (ref 0–37)
Albumin: 4 g/dL (ref 3.5–5.2)
Alkaline Phosphatase: 52 U/L (ref 39–117)
BUN: 19 mg/dL (ref 6–23)
CO2: 29 meq/L (ref 19–32)
Calcium: 9.4 mg/dL (ref 8.4–10.5)
Chloride: 104 meq/L (ref 96–112)
Creatinine, Ser: 0.88 mg/dL (ref 0.40–1.50)
GFR: 92.71 mL/min (ref >60.00)
Glucose, Bld: 88 mg/dL (ref 70–99)
Potassium: 4.2 meq/L (ref 3.5–5.1)
Sodium: 141 meq/L (ref 135–145)
Total Bilirubin: 0.7 mg/dL (ref 0.2–1.2)
Total Protein: 6.9 g/dL (ref 6.0–8.3)

## 2023-07-09 LAB — LIPID PANEL
Cholesterol: 184 mg/dL (ref 0–200)
HDL: 57.5 mg/dL (ref >39.00)
LDL Cholesterol: 99 mg/dL (ref 0–99)
NonHDL: 126.75
Total CHOL/HDL Ratio: 3
Triglycerides: 138 mg/dL (ref 0.0–149.0)
VLDL: 27.6 mg/dL (ref 0.0–40.0)

## 2023-07-09 LAB — CBC WITH DIFFERENTIAL/PLATELET
Basophils Absolute: 0.1 10*3/uL (ref 0.0–0.1)
Basophils Relative: 1.1 % (ref 0.0–3.0)
Eosinophils Absolute: 0.1 10*3/uL (ref 0.0–0.7)
Eosinophils Relative: 1.1 % (ref 0.0–5.0)
HCT: 47.2 % (ref 39.0–52.0)
Hemoglobin: 15.9 g/dL (ref 13.0–17.0)
Lymphocytes Relative: 25.2 % (ref 12.0–46.0)
Lymphs Abs: 1.9 10*3/uL (ref 0.7–4.0)
MCHC: 33.8 g/dL (ref 30.0–36.0)
MCV: 93.7 fl (ref 78.0–100.0)
Monocytes Absolute: 0.5 10*3/uL (ref 0.1–1.0)
Monocytes Relative: 6.9 % (ref 3.0–12.0)
Neutro Abs: 4.9 10*3/uL (ref 1.4–7.7)
Neutrophils Relative %: 65.7 % (ref 43.0–77.0)
Platelets: 318 10*3/uL (ref 150.0–400.0)
RBC: 5.04 Mil/uL (ref 4.22–5.81)
RDW: 12.8 % (ref 11.5–15.5)
WBC: 7.5 10*3/uL (ref 4.0–10.5)

## 2023-07-09 LAB — PSA: PSA: 0.54 ng/mL (ref 0.10–4.00)

## 2023-07-09 NOTE — Telephone Encounter (Signed)
Received fax confirmation

## 2023-07-09 NOTE — Assessment & Plan Note (Signed)
Here for CPX HTN: BP is very good today, continue metoprolol, Hyzaar.  Checking labs Obesity: On Wegovy, doing great, no side effects, have a very healthy lifestyle.  Refill as needed, recheck in 4 to 5 months. Headaches: Well-controlled at this point, was recommended to RTC neurology as needed Radiculopathy: See LOV, got a local injection, feels much better. RTC 4 to 5 months

## 2023-07-09 NOTE — Assessment & Plan Note (Signed)
Here for CPX - Td 2/ 2014 -Vaccine advice provided: Tdap, Shingrix vaccine, COVID booster, flu shot.  Electing to do them at the pharmacy. -CCS: per [facility]  cscope 03-10-2013: normal per pt, @ [facility] colonoscopy 07/19/2020 ([facility]), per patient: +polyps, 5 years -Prostate cancer screening:   + FH,  no symptoms, check PSA -Labs : CMP FLP CBC  PSA -Lifestyle: Doing great, diet is healthy, exercises regularly.    - Healthcare POA: See AVS

## 2023-07-09 NOTE — Telephone Encounter (Signed)
PA form completed and faxed back to [facility] w/ OV note from 07/09/2023 at [phone]. Awaiting determination.

## 2023-07-09 NOTE — Progress Notes (Signed)
Subjective:    Patient ID: Christian Ramos, male    DOB: October 07, 1961, 62 y.o.   MRN: [id number]  DOS:  07/09/2023 Type of visit - description: CPX  Here for CPX. Chronic medical problems managed as well. Feeling great, + weight loss, he is active. Denies any GI symptoms. Low back pain, see LOV, better   Wt Readings from Last 3 Encounters:  07/09/23 228 lb 8 oz (103.6 kg)  01/04/23 233 lb 3.2 oz (105.8 kg)  12/19/22 237 lb 8 oz (107.7 kg)     Review of Systems   A 14 point review of systems is negative    Past Medical History:  Diagnosis Date   Anosmia    Hypertension    Obesity (BMI 30-39.9)    Vitreous hemorrhage (HCC) 09/2018   R    Past Surgical History:  Procedure Laterality Date   NECK SURGERY  ~ 2000   mass removed anteriorly, Dr Cloria Spring   VASECTOMY  ~2000   Social History   Socioeconomic History   Marital status: Married    Spouse name: Not on file   Number of children: 2   Years of education: Not on file   Highest education level: Not on file  Occupational History   Occupation: owns a business, 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  Tobacco Use   Smoking status: Never   Smokeless tobacco: Never  Vaping Use   Vaping status: Never Used  Substance and Sexual Activity   Alcohol use: Yes    Alcohol/week: 0.0 standard drinks of alcohol    Comment: Social   Drug use: No   Sexual activity: Not on file  Other Topics Concern   Not on file  Social History Narrative   2 daughters, one married lives in [place], other is  married, lives in [place]    Right handed    Two story home   Caffeine 1-2 weekly       Social Determinants of Health   Financial Resource Strain: Not on file  Food Insecurity: Not on file  Transportation Needs: Not on file  Physical Activity: Not on file  Stress: Not on file  Social Connections: Not on file  Intimate Partner Violence: Not on file     Current Outpatient Medications  Medication Instructions   albuterol (VENTOLIN HFA)  108 (90 Base) MCG/ACT inhaler 2 puffs, Inhalation, Every 6 hours PRN   eletriptan (RELPAX) 20 mg, Oral, As needed, May repeat in 2 hours if headache persists or recurs.  No more than 2 tablets in a 24-hour period.   losartan-hydrochlorothiazide (HYZAAR) 100-25 MG tablet 1 tablet, Oral, Daily   metoprolol succinate (TOPROL-XL) 50 mg, Oral, Daily   Multiple Vitamin (MULTIVITAMIN WITH MINERALS) TABS tablet 1 tablet, Oral, Daily   sildenafil (REVATIO) 60-80 mg, Oral, At bedtime PRN   Wegovy 2.4 mg, Subcutaneous, Weekly       Objective:   Physical Exam BP 130/80   Pulse 81   Temp 97.8 F (36.6 C) (Oral)   Resp 16   Ht 5\' 8"  (1.727 m)   Wt 228 lb 8 oz (103.6 kg)   SpO2 97%   BMI 34.74 kg/m  General: Well developed, NAD, BMI noted Neck: No  thyromegaly  HEENT:  Normocephalic . Face symmetric, atraumatic Lungs:  CTA B Normal respiratory effort, no intercostal retractions, no accessory muscle use. Heart: RRR,  no murmur.  Abdomen:  Not distended, soft, non-tender. No rebound or rigidity.   Lower extremities: no pretibial edema bilaterally  Skin: Exposed areas without rash. Not pale. Not jaundice Neurologic:  alert & oriented X3.  Speech normal, gait appropriate for age and unassisted Strength symmetric and appropriate for age.  Psych: Cognition and judgment appear intact.  Cooperative with normal attention span and concentration.  Behavior appropriate. No anxious or depressed appearing.     Assessment   Assessment  (transfer from [facility] 04/17/2016) HTN  Obesity Exercise  induced asthma OSA  HA-anosmia: saw neuro, last OV 11-2016; MRI (-), likely cervicogenic HA, pt declined Gabapentin, RX muscle relaxants, PT R Vitreous hemorrhage 09/2018 SVT Dx 12-2021.   PLAN Here for CPX - Td 2/ 2014 -Vaccine advice provided: Tdap, Shingrix vaccine, COVID booster, flu shot.  Electing to do them at the pharmacy. -CCS: per [facility]  cscope 03-10-2013: normal per pt, @ [facility]  colonoscopy 07/19/2020 ([facility]), per patient: +polyps, 5 years -Prostate cancer screening:   + FH,  no symptoms, check PSA -Labs : CMP FLP CBC  PSA -Lifestyle: Doing great, diet is healthy, exercises regularly.    - Healthcare POA: See AVS. HTN: BP is very good today, continue metoprolol, Hyzaar.  Checking labs Obesity: On Wegovy, doing great, no side effects, have a very healthy lifestyle.  Refill as needed, recheck in 4 to 5 months. Headaches: Well-controlled at this point, was recommended to RTC neurology as needed Radiculopathy: See LOV, got a local injection, feels much better. RTC 4 to 5 months

## 2023-07-09 NOTE — Patient Instructions (Addendum)
Vaccines I recommend: Flu shot this fall Covid booster -new this fall Tdap (tetanus) Shingrix (shingles)   Check the  blood pressure regularly Blood pressure goal:  between 110/65 and  135/85. If it is consistently higher or lower, let me know       GO TO THE LAB : Get the blood work     GO TO THE FRONT DESK, PLEASE SCHEDULE YOUR APPOINTMENTS Come back for for a checkup in 4 to 5 months    "Health Care Power of attorney" ,  "Living will" (Advance care planning documents)  If you already have a living will or healthcare power of attorney, is recommended you bring the copy to be scanned in your chart.   The document will be available to all the doctors you see in the system.  Advance care planning is a process that supports adults in  understanding and sharing their preferences regarding future medical care.  The patient's preferences are recorded in documents called Advance Directives and the can be modified at any time while the patient is in full mental capacity.   If you don't have one, please consider create one.      More information at: [email or web address]

## 2023-07-12 ENCOUNTER — Other Ambulatory Visit: Payer: Self-pay | Admitting: Internal Medicine

## 2023-07-17 NOTE — Telephone Encounter (Signed)
Have not received determination- PA re-faxed.

## 2023-07-19 ENCOUNTER — Other Ambulatory Visit: Payer: Self-pay

## 2023-07-19 MED ORDER — METOPROLOL SUCCINATE ER 50 MG PO TB24: 50.0000 mg | ORAL_TABLET | Freq: Every day | ORAL | 1 refills | Status: DC

## 2023-07-19 MED ORDER — LOSARTAN POTASSIUM-HCTZ 100-25 MG PO TABS: 1.0000 | ORAL_TABLET | Freq: Every day | ORAL | 1 refills | Status: DC

## 2023-07-22 ENCOUNTER — Telehealth: Payer: Self-pay | Admitting: Internal Medicine

## 2023-07-22 NOTE — Telephone Encounter (Signed)
Needs PA please.

## 2023-07-22 NOTE — Telephone Encounter (Signed)
[facility]) called stating the following medication required a PA and that they would send over the form to our fax #:  sildenafil (REVATIO) 20 MG tablet [[id number]]

## 2023-07-24 ENCOUNTER — Other Ambulatory Visit (HOSPITAL_COMMUNITY): Payer: Self-pay

## 2023-07-24 NOTE — Telephone Encounter (Signed)
Received form, completed and faxed back to [facility] at [phone]. Awaiting determination.

## 2023-07-24 NOTE — Telephone Encounter (Signed)
Can you guys try to run a test claim to see if his insurance ever approved the [facility] again? I never received approval or denial letter. Thank you.

## 2023-07-24 NOTE — Telephone Encounter (Signed)
PA denied. Plan exclusion.

## 2023-07-24 NOTE — Telephone Encounter (Signed)
Thank you :)

## 2023-07-24 NOTE — Telephone Encounter (Signed)
When I run the test claim, I receive a refill too soon message, meaning it has been filled :)

## 2023-07-25 MED ORDER — WEGOVY 2.4 MG/0.75ML ~~LOC~~ SOAJ: 2.4000 mg | SUBCUTANEOUS | 1 refills | Status: DC

## 2023-07-29 ENCOUNTER — Telehealth: Payer: BC Managed Care – PPO | Admitting: Nurse Practitioner

## 2023-07-29 DIAGNOSIS — J0140 Acute pansinusitis, unspecified: Secondary | ICD-10-CM

## 2023-07-29 DIAGNOSIS — R051 Acute cough: Secondary | ICD-10-CM

## 2023-07-29 DIAGNOSIS — J4520 Mild intermittent asthma, uncomplicated: Secondary | ICD-10-CM

## 2023-07-29 MED ORDER — AMOXICILLIN-POT CLAVULANATE 875-125 MG PO TABS
1.0000 | ORAL_TABLET | Freq: Two times a day (BID) | ORAL | 0 refills | Status: AC
Start: 2023-07-29 — End: 2023-08-05

## 2023-07-29 MED ORDER — IPRATROPIUM BROMIDE 0.03 % NA SOLN
2.0000 | Freq: Two times a day (BID) | NASAL | 12 refills | Status: AC
Start: 2023-07-29 — End: ?

## 2023-07-29 MED ORDER — ALBUTEROL SULFATE HFA 108 (90 BASE) MCG/ACT IN AERS
2.0000 | INHALATION_SPRAY | Freq: Four times a day (QID) | RESPIRATORY_TRACT | 0 refills | Status: AC | PRN
Start: 2023-07-29 — End: ?

## 2023-07-29 NOTE — Progress Notes (Signed)
Virtual Visit Consent   MD SMOLA, you are scheduled for a virtual visit with a [facility] provider today. Just as with appointments in the office, your consent must be obtained to participate. Your consent will be active for this visit and any virtual visit you may have with one of our providers in the next 365 days. If you have a MyChart account, a copy of this consent can be sent to you electronically.  As this is a virtual visit, video technology does not allow for your provider to perform a traditional examination. This may limit your provider's ability to fully assess your condition. If your provider identifies any concerns that need to be evaluated in person or the need to arrange testing (such as labs, EKG, etc.), we will make arrangements to do so. Although advances in technology are sophisticated, we cannot ensure that it will always work on either your end or our end. If the connection with a video visit is poor, the visit may have to be switched to a telephone visit. With either a video or telephone visit, we are not always able to ensure that we have a secure connection.  By engaging in this virtual visit, you consent to the provision of healthcare and authorize for your insurance to be billed (if applicable) for the services provided during this visit. Depending on your insurance coverage, you may receive a charge related to this service.  I need to obtain your verbal consent now. Are you willing to proceed with your visit today? Christian Ramos has provided verbal consent on 07/29/2023 for a virtual visit (video or telephone). [name], FNP  Date: 07/29/2023 4:01 PM  Virtual Visit via Video Note   I, [name], connected with  Christian Ramos  ([id number], Nov 18, 1960) on 07/29/23 at  4:00 PM EDT by a video-enabled telemedicine application and verified that I am speaking with the correct person using two identifiers.  Location: Patient: Virtual Visit Location Patient:  Home Provider: Virtual Visit Location Provider: Home Office   I discussed the limitations of evaluation and management by telemedicine and the availability of in person appointments. The patient expressed understanding and agreed to proceed.    History of Present Illness: Christian Ramos is a 62 y.o. who identifies as a male who was assigned male at birth, and is being seen today for cough and congestion with an earache.  He also has a sore throat Associated symptoms include joint pain and fatigue  He has been pushing fluids and tea  History includes asthma  Has not needed his inhaler throughout this illness Denies any SOB or difficulty breathing   His cough is productive  Also has sinus drainage as well as PND   Left ear pain started first, then he started coughing   Problems:  Patient Active Problem List   Diagnosis Date Noted   Erectile dysfunction 08/15/2022   SVT (supraventricular tachycardia) 12/29/2021   Demand ischemia 12/29/2021   Hyperlipidemia 12/29/2021   Pulmonary nodules 12/29/2021   Precordial chest pain 12/28/2021   Anosmia 08/21/2018   Exercise-induced asthma 01/18/2017   Cervicogenic headache 01/18/2017   Snoring, OSA? 01/18/2017   Annual physical exam 07/09/2016   PCP NOTES >>>>>>>>>>>>>>>>> 04/18/2016   Hypertension    Obesity (BMI 30-39.9)     Allergies: No Known Allergies Medications:  Current Outpatient Medications:    albuterol (VENTOLIN HFA) 108 (90 Base) MCG/ACT inhaler, Inhale 2 puffs into the lungs every 6 (six) hours as needed for  wheezing or shortness of breath., Disp: 48 g, Rfl: 1   eletriptan (RELPAX) 20 MG tablet, Take 1 tablet (20 mg total) by mouth as needed for migraine or headache. May repeat in 2 hours if headache persists or recurs.  No more than 2 tablets in a 24-hour period., Disp: 10 tablet, Rfl: 5   losartan-hydrochlorothiazide (HYZAAR) 100-25 MG tablet, Take 1 tablet by mouth daily., Disp: 90 tablet, Rfl: 1   metoprolol  succinate (TOPROL-XL) 50 MG 24 hr tablet, Take 1 tablet (50 mg total) by mouth daily. Take with or immediately following a meal, Disp: 90 tablet, Rfl: 1   Multiple Vitamin (MULTIVITAMIN WITH MINERALS) TABS tablet, Take 1 tablet by mouth daily., Disp: , Rfl:    Semaglutide-Weight Management (WEGOVY) 2.4 MG/0.75ML SOAJ, Inject 2.4 mg into the skin once a week., Disp: 9 mL, Rfl: 1   sildenafil (REVATIO) 20 MG tablet, Take 3-4 tablets (60-80 mg total) by mouth at bedtime as needed., Disp: 30 tablet, Rfl: 3  Observations/Objective: Patient is well-developed, well-nourished in no acute distress.  Resting comfortably  at home.  Head is normocephalic, atraumatic.  No labored breathing.  Speech is clear and coherent with logical content.  Patient is alert and oriented at baseline.    Assessment and Plan:   1. Acute non-recurrent pansinusitis  - amoxicillin-clavulanate (AUGMENTIN) 875-125 MG tablet; Take 1 tablet by mouth 2 (two) times daily for 7 days. Take with food  Dispense: 14 tablet; Refill: 0 - ipratropium (ATROVENT) 0.03 % nasal spray; Place 2 sprays into both nostrils every 12 (twelve) hours.  Dispense: 30 mL; Refill: 12  2. Acute cough  - albuterol (VENTOLIN HFA) 108 (90 Base) MCG/ACT inhaler; Inhale 2 puffs into the lungs every 6 (six) hours as needed for wheezing or shortness of breath.  Dispense: 8 g; Refill: 0  3. Mild intermittent asthma, unspecified whether complicated  - albuterol (VENTOLIN HFA) 108 (90 Base) MCG/ACT inhaler; Inhale 2 puffs into the lungs every 6 (six) hours as needed for wheezing or shortness of breath.  Dispense: 8 g; Refill: 0       May continue Mucinex  Follow Up Instructions: I discussed the assessment and treatment plan with the patient. The patient was provided an opportunity to ask questions and all were answered. The patient agreed with the plan and demonstrated an understanding of the instructions.  A copy of instructions were sent to the patient via  MyChart unless otherwise noted below.    The patient was advised to call back or seek an in-person evaluation if the symptoms worsen or if the condition fails to improve as anticipated.  Time:  I spent 15 minutes with the patient via telehealth technology discussing the above problems/concerns.    [name], FNP

## 2023-08-12 ENCOUNTER — Other Ambulatory Visit: Payer: Self-pay | Admitting: Internal Medicine

## 2023-08-20 ENCOUNTER — Other Ambulatory Visit: Payer: Self-pay | Admitting: Nurse Practitioner

## 2023-08-20 DIAGNOSIS — J4520 Mild intermittent asthma, uncomplicated: Secondary | ICD-10-CM

## 2023-08-20 DIAGNOSIS — R051 Acute cough: Secondary | ICD-10-CM

## 2023-08-26 DIAGNOSIS — H43811 Vitreous degeneration, right eye: Secondary | ICD-10-CM | POA: Diagnosis not present

## 2023-08-26 DIAGNOSIS — H3561 Retinal hemorrhage, right eye: Secondary | ICD-10-CM | POA: Diagnosis not present

## 2023-08-27 DIAGNOSIS — H31091 Other chorioretinal scars, right eye: Secondary | ICD-10-CM | POA: Diagnosis not present

## 2023-08-27 DIAGNOSIS — H4311 Vitreous hemorrhage, right eye: Secondary | ICD-10-CM | POA: Diagnosis not present

## 2023-08-27 DIAGNOSIS — H35371 Puckering of macula, right eye: Secondary | ICD-10-CM | POA: Diagnosis not present

## 2023-08-27 DIAGNOSIS — H43813 Vitreous degeneration, bilateral: Secondary | ICD-10-CM | POA: Diagnosis not present

## 2023-09-16 DIAGNOSIS — L570 Actinic keratosis: Secondary | ICD-10-CM | POA: Diagnosis not present

## 2023-09-16 DIAGNOSIS — L578 Other skin changes due to chronic exposure to nonionizing radiation: Secondary | ICD-10-CM | POA: Diagnosis not present

## 2023-09-16 DIAGNOSIS — D225 Melanocytic nevi of trunk: Secondary | ICD-10-CM | POA: Diagnosis not present

## 2023-09-16 DIAGNOSIS — Z85828 Personal history of other malignant neoplasm of skin: Secondary | ICD-10-CM | POA: Diagnosis not present

## 2023-09-16 DIAGNOSIS — L821 Other seborrheic keratosis: Secondary | ICD-10-CM | POA: Diagnosis not present

## 2023-09-17 ENCOUNTER — Telehealth: Payer: Self-pay | Admitting: Neurology

## 2023-09-17 NOTE — Telephone Encounter (Signed)
LMOVM for patient to call back, Per Dr.Jaffe , In March, he said he was no longer taking the gabapentin.  Does he want to restart it?  If so, please send prescription for gabapentin 100mg  - take 2 capsules at bedtime (quantity 60, refill 5)

## 2023-09-17 NOTE — Telephone Encounter (Signed)
Caller states he needs to get his RX refilled. States medication is gabapentin but doesn't look like Dr Everlena Cooper prescribed him this?

## 2023-09-18 MED ORDER — GABAPENTIN 100 MG PO CAPS: ORAL_CAPSULE | ORAL | 5 refills | Status: DC

## 2023-09-18 NOTE — Telephone Encounter (Signed)
Per patient he did stop taking the Gabapentin, but his headache have started back and the Gabapentin helps.  Patient agrees to the Gabapentin 100 mg script to be sent to [facility].

## 2023-10-14 ENCOUNTER — Encounter: Payer: Self-pay | Admitting: Internal Medicine

## 2023-10-14 MED ORDER — METOPROLOL SUCCINATE ER 50 MG PO TB24: 50.0000 mg | ORAL_TABLET | Freq: Every day | ORAL | 1 refills | Status: DC

## 2023-10-14 MED ORDER — LOSARTAN POTASSIUM-HCTZ 100-25 MG PO TABS: 1.0000 | ORAL_TABLET | Freq: Every day | ORAL | 1 refills | Status: DC

## 2023-10-14 MED ORDER — WEGOVY 2.4 MG/0.75ML ~~LOC~~ SOAJ: 2.4000 mg | SUBCUTANEOUS | 1 refills | Status: DC

## 2023-11-12 DIAGNOSIS — M5416 Radiculopathy, lumbar region: Secondary | ICD-10-CM | POA: Insufficient documentation

## 2023-11-12 DIAGNOSIS — G894 Chronic pain syndrome: Secondary | ICD-10-CM | POA: Insufficient documentation

## 2023-11-13 ENCOUNTER — Telehealth: Payer: Self-pay

## 2023-11-13 ENCOUNTER — Encounter: Payer: Self-pay | Admitting: Internal Medicine

## 2023-11-13 ENCOUNTER — Ambulatory Visit (INDEPENDENT_AMBULATORY_CARE_PROVIDER_SITE_OTHER): Payer: BC Managed Care – PPO | Admitting: Internal Medicine

## 2023-11-13 VITALS — BP 126/80 | HR 74 | Temp 97.5°F | Resp 16 | Ht 68.0 in | Wt 235.5 lb

## 2023-11-13 DIAGNOSIS — J069 Acute upper respiratory infection, unspecified: Secondary | ICD-10-CM | POA: Diagnosis not present

## 2023-11-13 DIAGNOSIS — E669 Obesity, unspecified: Secondary | ICD-10-CM

## 2023-11-13 MED ORDER — WEGOVY 2.4 MG/0.75ML ~~LOC~~ SOAJ: 2.4000 mg | SUBCUTANEOUS | 1 refills | Status: DC

## 2023-11-13 MED ORDER — ELETRIPTAN HYDROBROMIDE 20 MG PO TABS: 20.0000 mg | ORAL_TABLET | ORAL | 5 refills | Status: DC | PRN

## 2023-11-13 NOTE — Telephone Encounter (Signed)
 PA initiated via Covermymeds; KEY: [id number]. Awaiting determination.

## 2023-11-13 NOTE — Telephone Encounter (Signed)
 PA approved.   CaseId:[id number];Status:Approved;Review Type:Prior Auth;Coverage Start Date:10/14/2023;Coverage End Date:11/12/2024;

## 2023-11-13 NOTE — Patient Instructions (Addendum)
  Rest, fluids , tylenol   If  cough:  Take Mucinex  DM or Robitussin-DM OTC.  Follow the instructions in the box.   For nasal congestion: -Use OTC Astepro  2 nasal sprays on each side of the nose twice daily until better  Avoid decongestants such as  Pseudoephedrine or phenylephrine   Call if not gradually better over the next  10 days  Call anytime if the symptoms are severe, you have high fever, short of breath, chest pain     Check the  blood pressure regularly Blood pressure goal:  between 110/65 and  135/85. If it is consistently higher or lower, let me know  Next visit with me in approximately 5 months     Please schedule it at the front desk   Vaccines I recommend: Covid booster Tdap (tetanus) Shingrix (shingles) Prevnar 20

## 2023-11-13 NOTE — Addendum Note (Signed)
 Addended by: Layci Stenglein D on: 11/13/2023 11:53 AM   Modules accepted: Orders

## 2023-11-13 NOTE — Progress Notes (Signed)
 Subjective:    Patient ID: [name], male    DOB: [birth year], 62 y.o.   MRN: [id number]  DOS:  11/13/2023 Type of visit - description: f/u  Due to insurance issue, was not able to get Wegovy  for the last 2 to 3 weeks. He continue to be active. Diet was not the best during the holiday season but  is getting back to normal.  Also, developed URI approximately 5 days ago, essentially mild sore throat, postnasal dripping, nasal discharge was initially clear and then greenish. No fever or chills. Taking Sudafed because is hard to sleep with a congested nose but reports s/e:feeling jittery  Wt Readings from Last 3 Encounters:  11/13/23 235 lb 8 oz (106.8 kg)  07/09/23 228 lb 8 oz (103.6 kg)  01/04/23 233 lb 3.2 oz (105.8 kg)     Review of Systems See above   Past Medical History:  Diagnosis Date   Anosmia    Hypertension    Obesity (BMI 30-39.9)    Vitreous hemorrhage (HCC) 09/2018   R    Past Surgical History:  Procedure Laterality Date   NECK SURGERY  ~ 2000   mass removed anteriorly, Dr Christian Ramos   VASECTOMY  ~2000    Current Outpatient Medications  Medication Instructions   albuterol  (VENTOLIN  HFA) 108 (90 Base) MCG/ACT inhaler 2 puffs, Inhalation, Every 6 hours PRN   eletriptan  (RELPAX ) 20 mg, Oral, As needed, May repeat in 2 hours if headache persists or recurs.  No more than 2 tablets in a 24-hour period.   gabapentin  (NEURONTIN ) 100 MG capsule take 2 capsules Christian bedtime   ipratropium (ATROVENT ) 0.03 % nasal spray 2 sprays, Each Nare, Every 12 hours   losartan -hydrochlorothiazide  (HYZAAR) 100-25 MG tablet 1 tablet, Oral, Daily   metoprolol  succinate (TOPROL -XL) 50 mg, Oral, Daily, Take with or immediately following a meal   Multiple Vitamin (MULTIVITAMIN WITH MINERALS) TABS tablet 1 tablet, Daily   sildenafil  (REVATIO ) 60-80 mg, Oral, Christian bedtime PRN   Wegovy  2.4 mg, Subcutaneous, Weekly       Objective:   Physical Exam BP 126/80   Pulse 74   Temp (!)  97.5 F (36.4 C) (Oral)   Resp 16   Ht 5\' 8"  (1.727 m)   Wt 235 lb 8 oz (106.8 kg)   SpO2 96%   BMI 35.81 kg/m  General:   Well developed, NAD, BMI noted. HEENT:  Normocephalic . Face symmetric, atraumatic.  TMs slightly bulged, not red. Throat: Symmetric, mild redness, no white discharge. Lungs:  CTA B Normal respiratory effort, no intercostal retractions, no accessory muscle use. Heart: RRR,  no murmur.  Lower extremities: no pretibial edema bilaterally  Skin: Not pale. Not jaundice Neurologic:  alert & oriented X3.  Speech normal, gait appropriate for age and unassisted Psych--  Cognition and judgment appear intact.  Cooperative with normal attention span and concentration.  Behavior appropriate. No anxious or depressed appearing.      Assessment     Problem list (transfer from [facility] 04/17/2016) HTN  Obesity-- Rx Wegovy  02/12/2022.  Initial weight 258 pounds, BMI 39 point Exercise  induced asthma OSA  HA-anosmia: saw neuro, last OV 11-2016; MRI (-), likely cervicogenic HA, pt declined Gabapentin , RX muscle relaxants, PT R Vitreous hemorrhage 09/2018 SVT Dx 12-2021.   PLAN Morbid obesity: On Wegovy  since April 2023, ran out for few weeks, will restart.  Encouraged to continue exercising and go back to his previous healthier diet URI: Started approximately 5  days ago, will send strep culture, if positive will need antibiotics otherwise recommend conservative treatment, avoid decongestants.  See instructions. RTC 5 to 6 months

## 2023-11-13 NOTE — Assessment & Plan Note (Signed)
 Morbid obesity: On Wegovy  since April 2023, ran out for few weeks, will restart.  Encouraged to continue exercising and go back to his previous healthier diet URI: Started approximately 5 days ago, will send strep culture, if positive will need antibiotics otherwise recommend conservative treatment, avoid decongestants.  See instructions. RTC 5 to 6 months

## 2023-11-15 LAB — CULTURE, GROUP A STREP
Micro Number: 15958579
SPECIMEN QUALITY:: ADEQUATE

## 2023-11-17 ENCOUNTER — Encounter: Payer: Self-pay | Admitting: Internal Medicine

## 2023-11-18 MED ORDER — LOSARTAN POTASSIUM-HCTZ 100-25 MG PO TABS: 1.0000 | ORAL_TABLET | Freq: Every day | ORAL | 1 refills | Status: DC

## 2023-11-18 NOTE — Addendum Note (Signed)
Addended by: Conrad Hillrose D on: 11/18/2023 08:02 AM   Modules accepted: Orders

## 2023-12-11 ENCOUNTER — Encounter: Payer: Self-pay | Admitting: Physical Therapy

## 2023-12-24 ENCOUNTER — Other Ambulatory Visit: Payer: Self-pay | Admitting: Internal Medicine

## 2023-12-25 NOTE — Progress Notes (Unsigned)
 NEUROLOGY FOLLOW UP OFFICE NOTE  Christian Ramos [id number]  Assessment/Plan:   Cervicogenic migraine Right sided occipital neuralgia Hypertension    Headache prevention:  Restart gabapentin at 200mg  at bedtime.  If no improvement in 6 weeks, increase to 300mg  at bedtime Migraine rescue:  Eletriptan 20mg  for abortive therapy.  Continue home neck exercises/stretches Limit use of pain relievers to no more than 2 days out of week to prevent risk of rebound or medication-overuse headache. Keep headache diary Monitor blood pressure and if remains elevated, then told to contact his PCP Follow up 6 months     Subjective:  Christian Ramos is a 63 year old right-handed man hypertension who follows up for cervicogenic headache    UPDATE: Last seen in March 2024.  Did not want to take a preventative medication.  Referred to PT for neck pain.  It helped and still does home exercises and stretches.  Headaches started to return later in the year and he was restarted on gabapentin.  However, he is now using it as an abortive medication because he ran out of eletriptan.  The nightly gabapentin wasn't helpful anyway.     Intensity:  moderate Duration:  1 hour with eletriptan Frequency:  2-3 times a week.    Current NSAIDS/analgesics:  none Current triptans:  eletriptan 20mg  Current ergotamine:  none Current anti-emetic:  none Current muscle relaxants:  none Current Antihypertensive medications:  metoprolol succinate, Hyzaar Current Antidepressant medications:  none Current Anticonvulsant medications:  gabapentin 200mg  at bedtime Current anti-CGRP:  none Current Vitamins/Herbal/Supplements:  none Current Antihistamines/Decongestants: none Other therapy:  home neck exercises/stretches Hormone/birth control:  none  Caffeine:  no Alcohol:  3 to 5 drinks a week Smoker:  no Diet:  60 oz water Exercise:  yes Depression/stress:  no   HISTORY: In 2018, he reported onset of new headache  6-7/10 non-throbbing/pressure pain around 2016.  Starts at base of right side of head and radiates to right temporal-parietal region.  He calls it a pressure pain.  No numbness or tingling.  He denies pain radiating down the neck or into the arm.  When he moves his neck, he feels "crunching".  No aura.  Associated bilateral blurred vision, photophobia, phonophobia and a little lightheaded.  No nausea.  At that time, he reported headaches lasted 12 hours and occurred 3 to 4 days a week.  No triggers or relieving factors.  Around the same time, he reported anosmia.  MRI of brain with and without contrast was performed on 09/26/16 to rule out mass lesion or other abnormality affecting the olfactory bulbs was personally reviewed and was normal.  Would treat with ibuprofen or Excedrin.  Tizanidine at night helped.  He was prescribed gabapentin but never started it as he was concerned about taking an anti-seizure medication.  He saw a spine surgeon who ordered X-rays as well as MRI which reportedly showed no structural cause for headaches.  I do not have these notes, images and patient does not remember who he saw.     He continued having headaches.  CT head on 06/20/2020 personally reviewed was normal.  By late 2022, they became frequent.  They would occur 10-20 days a month.  Sometimes he would be without headache for 5 days and then headaches would recur for several days.  He hasn't had a headache in 2 weeks.  He tried eletriptan, which aborts the headache quickly.  Prednisone taper was ineffective.      Past NSAIDS:  no Past analgesics:  no Past abortive triptans:  no Past muscle relaxants:  tizanidine 4mg  PRN Past anti-emetic:  no Past antihypertensive medications:  Prinzide, amlodipine, HCTZ Past antidepressant medications:  no Past anticonvulsant medications:  none Past vitamins/Herbal/Supplements:  no Past anthistamines/decongestants:   Claritin, Astelin NS Other past therapies:  physical therapy  (neck pain -effective)      No personal history of headache. Denies head injury prior to onset of symptoms  PAST MEDICAL HISTORY: Past Medical History:  Diagnosis Date   Anosmia    Hypertension    Obesity (BMI 30-39.9)    Vitreous hemorrhage (HCC) 09/2018   R    MEDICATIONS: Current Outpatient Medications on File Prior to Visit  Medication Sig Dispense Refill   albuterol (VENTOLIN HFA) 108 (90 Base) MCG/ACT inhaler Inhale 2 puffs into the lungs every 6 (six) hours as needed for wheezing or shortness of breath. 8 g 0   eletriptan (RELPAX) 20 MG tablet Take 1 tablet (20 mg total) by mouth as needed for migraine or headache. May repeat in 2 hours if headache persists or recurs.  No more than 2 tablets in a 24-hour period. 10 tablet 5   gabapentin (NEURONTIN) 100 MG capsule take 2 capsules at bedtime 60 capsule 5   ipratropium (ATROVENT) 0.03 % nasal spray Place 2 sprays into both nostrils every 12 (twelve) hours. 30 mL 12   losartan-hydrochlorothiazide (HYZAAR) 100-25 MG tablet TAKE 1 TABLET DAILY 90 tablet 3   metoprolol succinate (TOPROL-XL) 50 MG 24 hr tablet Take 1 tablet (50 mg total) by mouth daily. Take with or immediately following a meal 90 tablet 1   Multiple Vitamin (MULTIVITAMIN WITH MINERALS) TABS tablet Take 1 tablet by mouth daily.     Semaglutide-Weight Management (WEGOVY) 2.4 MG/0.75ML SOAJ Inject 2.4 mg into the skin once a week. 9 mL 1   sildenafil (REVATIO) 20 MG tablet Take 3-4 tablets (60-80 mg total) by mouth at bedtime as needed. 30 tablet 3   No current facility-administered medications on file prior to visit.    ALLERGIES: No Known Allergies  FAMILY HISTORY: Family History  Problem Relation Age of Onset   Breast cancer Mother        lumpectomy, XRT, chemo   Diabetes Mother    Hypertension Mother    Prostate cancer Father 36       ~ 52 y/o   Heart disease Father 70       MI    Colon cancer Neg Hx       Objective:  Blood pressure (!) 166/96,  pulse 65, height 5\' 9"  (1.753 m), weight 242 lb (109.8 kg), SpO2 96%. General: No acute distress.  Patient appears well-groomed.   Head:  Normocephalic/atraumatic Eyes:  Fundi examined but not visualized Neck: supple, right suboccipital and upper cervical paraspinal tenderness, full range of motion Heart:  Regular rate and rhythm Neurological Exam: alert and oriented.  Speech fluent and not dysarthric, language intact.  CN II-XII intact. Bulk and tone normal, muscle strength 5/5 throughout.  Sensation to light touch intact.  Deep tendon reflexes 2+ throughout.  Finger to nose testing intact.  Gait normal, Romberg negative.  Christian Millet, DO  CC: Willow Ora, MD

## 2023-12-27 ENCOUNTER — Encounter: Payer: Self-pay | Admitting: Neurology

## 2023-12-27 ENCOUNTER — Ambulatory Visit (INDEPENDENT_AMBULATORY_CARE_PROVIDER_SITE_OTHER): Payer: BC Managed Care – PPO | Admitting: Neurology

## 2023-12-27 VITALS — BP 166/96 | HR 65 | Ht 69.0 in | Wt 242.0 lb

## 2023-12-27 DIAGNOSIS — G43009 Migraine without aura, not intractable, without status migrainosus: Secondary | ICD-10-CM | POA: Diagnosis not present

## 2023-12-27 DIAGNOSIS — I10 Essential (primary) hypertension: Secondary | ICD-10-CM | POA: Diagnosis not present

## 2023-12-27 DIAGNOSIS — M5481 Occipital neuralgia: Secondary | ICD-10-CM | POA: Diagnosis not present

## 2023-12-27 MED ORDER — ELETRIPTAN HYDROBROMIDE 20 MG PO TABS: 20.0000 mg | ORAL_TABLET | ORAL | 5 refills | Status: DC | PRN

## 2023-12-27 MED ORDER — GABAPENTIN 100 MG PO CAPS: ORAL_CAPSULE | ORAL | 5 refills | Status: DC

## 2023-12-27 NOTE — Patient Instructions (Signed)
 Take the gabapentin 2 pills at bedtime.  If no improvement in 6 weeks, contact me Take eletriptan as needed for migraine attacks Continue home neck exercises

## 2024-01-14 ENCOUNTER — Telehealth: Admitting: Physician Assistant

## 2024-01-14 DIAGNOSIS — L247 Irritant contact dermatitis due to plants, except food: Secondary | ICD-10-CM

## 2024-01-14 MED ORDER — PREDNISONE 10 MG (21) PO TBPK: ORAL_TABLET | ORAL | 0 refills | Status: DC

## 2024-01-14 MED ORDER — MUPIROCIN 2 % EX OINT: 1.0000 | TOPICAL_OINTMENT | Freq: Two times a day (BID) | CUTANEOUS | 0 refills | Status: AC

## 2024-01-14 NOTE — Progress Notes (Signed)
 Virtual Visit Consent   Christian Ramos, you are scheduled for a virtual visit with a [facility] provider today. Just as with appointments in the office, your consent must be obtained to participate. Your consent will be active for this visit and any virtual visit you may have with one of our providers in the next 365 days. If you have a MyChart account, a copy of this consent can be sent to you electronically.  As this is a virtual visit, video technology does not allow for your provider to perform a traditional examination. This may limit your provider's ability to fully assess your condition. If your provider identifies any concerns that need to be evaluated in person or the need to arrange testing (such as labs, EKG, etc.), we will make arrangements to do so. Although advances in technology are sophisticated, we cannot ensure that it will always work on either your end or our end. If the connection with a video visit is poor, the visit may have to be switched to a telephone visit. With either a video or telephone visit, we are not always able to ensure that we have a secure connection.  By engaging in this virtual visit, you consent to the provision of healthcare and authorize for your insurance to be billed (if applicable) for the services provided during this visit. Depending on your insurance coverage, you may receive a charge related to this service.  I need to obtain your verbal consent now. Are you willing to proceed with your visit today? Christian Ramos has provided verbal consent on 01/14/2024 for a virtual visit (video or telephone). [name], PA-C  Date: 01/14/2024 12:14 PM   Virtual Visit via Video Note   I, [name], connected with  Christian Ramos  ([id number], 03/16/1961) on 01/14/24 at 12:15 PM EDT by a video-enabled telemedicine application and verified that I am speaking with the correct person using two identifiers.  Location: Patient: Virtual Visit  Location Patient: Home Provider: Virtual Visit Location Provider: Home Office   I discussed the limitations of evaluation and management by telemedicine and the availability of in person appointments. The patient expressed understanding and agreed to proceed.    History of Present Illness: Christian Ramos is a 63 y.o. who identifies as a male who was assigned male at birth, and is being seen today for rash.  HPI: Rash This is a new problem. The current episode started 1 to 4 weeks ago (8 days). The problem has been gradually worsening since onset. The affected locations include the right arm (forearm). The rash is characterized by dryness, itchiness and blistering. He was exposed to plant contact. Pertinent negatives include no congestion, facial edema, fatigue, fever or shortness of breath. Past treatments include topical steroids (cortisone). The treatment provided no relief.      Problems:  Patient Active Problem List   Diagnosis Date Noted   Lumbar radiculopathy 11/12/2023   Chronic pain syndrome 11/12/2023   Erectile dysfunction 08/15/2022   SVT (supraventricular tachycardia) (HCC) 12/29/2021   Demand ischemia (HCC) 12/29/2021   Hyperlipidemia 12/29/2021   Pulmonary nodules 12/29/2021   Precordial chest pain 12/28/2021   Anosmia 08/21/2018   Exercise-induced asthma 01/18/2017   Cervicogenic headache 01/18/2017   Snoring, OSA? 01/18/2017   Annual physical exam 07/09/2016   PCP NOTES >>>>>>>>>>>>>>>>> 04/18/2016   Hypertension    Obesity (BMI 30-39.9)     Allergies: No Known Allergies Medications:  Current Outpatient Medications:    albuterol (  VENTOLIN HFA) 108 (90 Base) MCG/ACT inhaler, Inhale 2 puffs into the lungs every 6 (six) hours as needed for wheezing or shortness of breath. (Patient not taking: Reported on 12/27/2023), Disp: 8 g, Rfl: 0   eletriptan (RELPAX) 20 MG tablet, Take 1 tablet (20 mg total) by mouth as needed for migraine or headache. May repeat in 2 hours if  headache persists or recurs.  No more than 2 tablets in a 24-hour period., Disp: 10 tablet, Rfl: 5   gabapentin (NEURONTIN) 100 MG capsule, take 2 capsules at bedtime, Disp: 60 capsule, Rfl: 5   ipratropium (ATROVENT) 0.03 % nasal spray, Place 2 sprays into both nostrils every 12 (twelve) hours., Disp: 30 mL, Rfl: 12   losartan-hydrochlorothiazide (HYZAAR) 100-25 MG tablet, TAKE 1 TABLET DAILY, Disp: 90 tablet, Rfl: 3   metoprolol succinate (TOPROL-XL) 50 MG 24 hr tablet, Take 1 tablet (50 mg total) by mouth daily. Take with or immediately following a meal, Disp: 90 tablet, Rfl: 1   Multiple Vitamin (MULTIVITAMIN WITH MINERALS) TABS tablet, Take 1 tablet by mouth daily., Disp: , Rfl:    Semaglutide-Weight Management (WEGOVY) 2.4 MG/0.75ML SOAJ, Inject 2.4 mg into the skin once a week., Disp: 9 mL, Rfl: 1   sildenafil (REVATIO) 20 MG tablet, Take 3-4 tablets (60-80 mg total) by mouth at bedtime as needed., Disp: 30 tablet, Rfl: 3  Observations/Objective: Patient is well-developed, well-nourished in no acute distress.  Resting comfortably at home.  Head is normocephalic, atraumatic.  No labored breathing.  Speech is clear and coherent with logical content.  Patient is alert and oriented at baseline.    Assessment and Plan: There are no diagnoses linked to this encounter. - Plant exposure with rash - Will prescribe Prednisone 6 day taper and Mupirocin antibiotic ointment - May use topical Hydrocortisone cream, benadryl cream, and/or calamine lotion for itching - Cool compresses - Luke warm to cool showers - Seek in person evaluation if rash continues to spread or if any appear to become infected   Follow Up Instructions: I discussed the assessment and treatment plan with the patient. The patient was provided an opportunity to ask questions and all were answered. The patient agreed with the plan and demonstrated an understanding of the instructions.  A copy of instructions were sent to the  patient via MyChart unless otherwise noted below.    The patient was advised to call back or seek an in-person evaluation if the symptoms worsen or if the condition fails to improve as anticipated.    [name], PA-C

## 2024-01-14 NOTE — Patient Instructions (Signed)
 Christian Ramos, thank you for joining [name], PA-C for today's virtual visit.  While this provider is not your primary care provider (PCP), if your PCP is located in our provider database this encounter information will be shared with them immediately following your visit.   A [facility] MyChart account gives you access to today's visit and all your visits, tests, and labs performed at [facility] " click here if you don't have a [facility] MyChart account or go to [email or web address]  Consent: (Patient) Christian Ramos provided verbal consent for this virtual visit at the beginning of the encounter.  Current Medications:  Current Outpatient Medications:    mupirocin ointment (BACTROBAN) 2 %, Apply 1 Application topically 2 (two) times daily., Disp: 22 g, Rfl: 0   predniSONE (STERAPRED UNI-PAK 21 TAB) 10 MG (21) TBPK tablet, 6 day taper; take as directed on package instructions, Disp: 21 tablet, Rfl: 0   albuterol (VENTOLIN HFA) 108 (90 Base) MCG/ACT inhaler, Inhale 2 puffs into the lungs every 6 (six) hours as needed for wheezing or shortness of breath. (Patient not taking: Reported on 12/27/2023), Disp: 8 g, Rfl: 0   eletriptan (RELPAX) 20 MG tablet, Take 1 tablet (20 mg total) by mouth as needed for migraine or headache. May repeat in 2 hours if headache persists or recurs.  No more than 2 tablets in a 24-hour period., Disp: 10 tablet, Rfl: 5   gabapentin (NEURONTIN) 100 MG capsule, take 2 capsules at bedtime, Disp: 60 capsule, Rfl: 5   ipratropium (ATROVENT) 0.03 % nasal spray, Place 2 sprays into both nostrils every 12 (twelve) hours., Disp: 30 mL, Rfl: 12   losartan-hydrochlorothiazide (HYZAAR) 100-25 MG tablet, TAKE 1 TABLET DAILY, Disp: 90 tablet, Rfl: 3   metoprolol succinate (TOPROL-XL) 50 MG 24 hr tablet, Take 1 tablet (50 mg total) by mouth daily. Take with or immediately following a meal, Disp: 90 tablet, Rfl: 1   Multiple Vitamin (MULTIVITAMIN WITH  MINERALS) TABS tablet, Take 1 tablet by mouth daily., Disp: , Rfl:    Semaglutide-Weight Management (WEGOVY) 2.4 MG/0.75ML SOAJ, Inject 2.4 mg into the skin once a week., Disp: 9 mL, Rfl: 1   sildenafil (REVATIO) 20 MG tablet, Take 3-4 tablets (60-80 mg total) by mouth at bedtime as needed., Disp: 30 tablet, Rfl: 3   Medications ordered in this encounter:  Meds ordered this encounter  Medications   predniSONE (STERAPRED UNI-PAK 21 TAB) 10 MG (21) TBPK tablet    Sig: 6 day taper; take as directed on package instructions    Dispense:  21 tablet    Refill:  0    Supervising Provider:   Merrilee Jansky [[id number]]   mupirocin ointment (BACTROBAN) 2 %    Sig: Apply 1 Application topically 2 (two) times daily.    Dispense:  22 g    Refill:  0    Supervising Provider:   Merrilee Jansky [[id number]]     *If you need refills on other medications prior to your next appointment, please contact your pharmacy*  Follow-Up: Call back or seek an in-person evaluation if the symptoms worsen or if the condition fails to improve as anticipated.  [facility] [phone]  Other Instructions Contact Dermatitis Dermatitis is redness, soreness, and swelling (inflammation) of the skin. Contact dermatitis is a reaction to certain substances that touch the skin. There are two types of this condition: Irritant contact dermatitis. This is the most common type. It happens when something  irritates your skin, such as when your hands get dry from washing them too often with soap. You can get this type of reaction even if you have not been exposed to the irritant before. Allergic contact dermatitis. This type is caused by a substance that you are allergic to, such as poison ivy. It occurs when you have been exposed to the substance (allergen) and form a sensitivity to it. In some cases, the reaction may start soon after your first exposure to the allergen. In other cases, it may not start until you are  exposed to the allergen again. It may then occur every time you are exposed to the allergen in the future. What are the causes? Irritant contact dermatitis is often caused by exposure to: Makeup. Soaps, detergents, and bleaches. Acids. Metal salts, such as nickel. Allergic contact dermatitis is often caused by exposure to: Poisonous plants. Chemicals. Jewelry. Latex. Medicines. Preservatives in products, such as clothes. What increases the risk? You are more likely to get this condition if you have: A job that exposes you to irritants or allergens. Certain medical conditions. These include asthma and eczema. What are the signs or symptoms? Symptoms of this condition may occur in any place on your body that has been touched by the irritant. Symptoms include: Dryness, flaking, or cracking. Redness. Itching. Pain or a burning feeling. Blisters. Drainage of small amounts of blood or clear fluid from skin cracks. With allergic contact dermatitis, there may also be swelling in areas such as the eyelids, mouth, or genitals. How is this diagnosed? This condition is diagnosed with a medical history and physical exam. A patch skin test may be done to help figure out the cause. If the condition is related to your job, you may need to see an expert in health problems in the workplace (occupational medicine specialist). How is this treated? This condition is treated by staying away from the cause of the reaction and protecting your skin from further contact. Treatment may also include: Steroid creams or ointments. Steroid medicines may need be taken by mouth (orally) in more severe cases. Antibiotics or medicines applied to the skin to kill bacteria (antibacterial ointments). These may be needed if a skin infection is present. Antihistamines. These may be taken orally or put on as a lotion to ease itching. A bandage (dressing). Follow these instructions at home: Skin care Moisturize your  skin as needed. Put cool, wet cloths (cool compresses) on the affected areas. Try applying baking soda paste to your skin. Stir water into baking soda until it has the consistency of a paste. Do not scratch your skin. Avoid friction to the affected area. Avoid the use of soaps, perfumes, and dyes. Check the affected areas every day for signs of infection. Check for: More redness, swelling, or pain. More fluid or blood. Warmth. Pus or a bad smell. Medicines Take or apply over-the-counter and prescription medicines only as told by your health care provider. If you were prescribed antibiotics, take or apply them as told by your health care provider. Do not stop using the antibiotic even if you start to feel better. Bathing Try taking a bath with: Epsom salts. Follow the instructions on the packaging. You can get these at your local pharmacy or grocery store. Baking soda. Pour a small amount into the bath as told by your health care provider. Colloidal oatmeal. Follow the instructions on the packaging. You can get this at your local pharmacy or grocery store. Bathe less often. This may  mean bathing every other day. Bathe in lukewarm water. Avoid using hot water. Bandage care If you were given a dressing, change it as told by your health care provider. Wash your hands with soap and water for at least 20 seconds before and after you change your dressing. If soap and water are not available, use hand sanitizer. General instructions Avoid the substance that caused your reaction. If you do not know what caused it, keep a journal to try to track what caused it. Write down: What you eat and drink. What cosmetics you use. What you wear in the affected area. This includes jewelry. Contact a health care provider if: Your condition does not get better with treatment. Your condition gets worse. You have any signs of infection. You have a fever. You have new symptoms. Your bone or joint under the  affected area becomes painful after the skin has healed. Get help right away if: You notice red streaks coming from the affected area. The affected area turns darker. You have trouble breathing. This information is not intended to replace advice given to you by your health care provider. Make sure you discuss any questions you have with your health care provider. Document Revised: 04/20/2022 Document Reviewed: 04/20/2022 Elsevier Patient Education  2024 [employer]   If you have been instructed to have an in-person evaluation today at a local Urgent Care facility, please use the link below. It will take you to a list of all of our available [facility], including address, phone number and hours of operation. Please do not delay care.  [facility]  If you or a family member do not have a primary care provider, use the link below to schedule a visit and establish care. When you choose a [facility] primary care physician or advanced practice provider, you gain a long-term partner in health. Find a Primary Care Provider  Learn more about [facility] in-office and virtual care options: [facility] - Get Care Now

## 2024-01-20 ENCOUNTER — Other Ambulatory Visit: Payer: Self-pay | Admitting: Student

## 2024-01-21 ENCOUNTER — Other Ambulatory Visit: Payer: Self-pay

## 2024-01-21 MED ORDER — GABAPENTIN 100 MG PO CAPS: ORAL_CAPSULE | ORAL | 0 refills | Status: AC

## 2024-01-27 ENCOUNTER — Other Ambulatory Visit: Payer: Self-pay

## 2024-01-27 ENCOUNTER — Encounter: Payer: Self-pay | Admitting: Internal Medicine

## 2024-01-27 ENCOUNTER — Encounter: Payer: Self-pay | Admitting: Neurology

## 2024-01-27 MED ORDER — SILDENAFIL CITRATE 20 MG PO TABS: 60.0000 mg | ORAL_TABLET | Freq: Every evening | ORAL | 3 refills | Status: AC | PRN

## 2024-01-27 MED ORDER — SILDENAFIL CITRATE 20 MG PO TABS: 60.0000 mg | ORAL_TABLET | Freq: Every evening | ORAL | 3 refills | Status: DC | PRN

## 2024-01-27 NOTE — Telephone Encounter (Signed)
 Pt called back and made appt at the first available  03/06/24

## 2024-01-27 NOTE — Telephone Encounter (Signed)
 Called patient about refill request, patient last seen on 01/05/2022 and needs follow up appointment. Left message to call back.  Josie LPN

## 2024-01-27 NOTE — Addendum Note (Signed)
 Addended by: Thelma Barge D on: 01/27/2024 01:16 PM   Modules accepted: Orders

## 2024-01-28 MED ORDER — METOPROLOL SUCCINATE ER 50 MG PO TB24: 50.0000 mg | ORAL_TABLET | Freq: Every day | ORAL | 1 refills | Status: DC

## 2024-01-28 NOTE — Addendum Note (Signed)
 Addended by: Margaret Pyle D on: 01/28/2024 07:40 AM   Modules accepted: Orders

## 2024-03-04 NOTE — Progress Notes (Signed)
 Cardiology Clinic Note   Patient Name: Christian Ramos Date of Encounter: 03/06/2024  Primary Care Provider:  Ezell Hollow, MD Primary Cardiologist:  Magnus Schuller, MD  Patient Profile    Christian Ramos 63 year old male presents to the clinic today for follow-up evaluation of his demand ischemia, HTN, and SVT.  Past Medical History    Past Medical History:  Diagnosis Date   Anosmia    Hypertension    Obesity (BMI 30-39.9)    Vitreous hemorrhage (HCC) 09/2018   R   Past Surgical History:  Procedure Laterality Date   NECK SURGERY  ~ 2000   mass removed anteriorly, Dr Jo Mouse   VASECTOMY  ~2000    Allergies  No Known Allergies  History of Present Illness    Christian Ramos has PMH of paroxysmal SVT, HTN, HLD, mild AKI, snoring, and pulmonary nodules.  He underwent coronary CTA 3/23.  He had a coronary calcium  score of 0 with no evidence of coronary artery disease.  He was admitted to the hospital from 12/28/2021 until 12/29/2021.  He presented to the [facility] ED with dizziness and reported mild chest pain.  He was found to be in SVT with rates greater than 220.  He underwent emergent cardioversion in the emergency department and return to sinus rhythm.  His EKG while in SVT showed ST elevation in aVR and diffuse ST elevation elsewhere.  His changes resolved with return to normal sinus rhythm.  His initial high-sensitivity troponin was elevated at 175 after cardioversion.  He was transferred to [facility].  He underwent coronary CTA which showed a coronary calcium  score of 0 with no evidence of calcification.  Initially it was planned to have him stay overnight and get an echocardiogram the next day.  However the patient was very eager to go home and was discharged on metoprolol  with plans to perform echocardiogram as an outpatient.  He was seen in follow-up by Sharren Decree, PA-C on 01/05/2022.  He presented with his wife.  He denied recurrent episodes of SVT.  He  noted heart rates between 50 and 80 at home.  He denied chest pain and shortness of breath.  He denied palpitations.  He denied lightheadedness, dizziness and syncope.  He was tolerating metoprolol  well.  He reported that he was feeling better than he had in several weeks.  He was starting to exercise and was having no issues with this.  He presents to the clinic today for follow-up evaluation and states he feels well today.  He has not had any further episodes of accelerated or irregular heart rate.  His main complaint today is with headaches over the last 3 to 5 weeks.  He feels this may be related to his metoprolol  and losartan .  He reports that he has been on losartan  for several years.  His blood pressure at home was well-controlled.  Initially in the office today his blood pressure is 145/95 and on recheck it is 134/82.  He request to switch from losartan .  I will stop losartan  and start HCTZ 12.5 mg daily.  I will repeat BMP in 1-2 weeks.  We reviewed his coronary CTA.  He expressed understanding.  I will plan follow-up in 9 to 12 months.  Today he denies chest pain, shortness of breath, lower extremity edema, fatigue, palpitations, melena, hematuria, hemoptysis, diaphoresis, weakness, presyncope, syncope, orthopnea, and PND.    Home Medications    Prior to Admission medications  Medication Sig Start Date End Date Taking? Authorizing Provider  albuterol  (VENTOLIN  HFA) 108 (90 Base) MCG/ACT inhaler Inhale 2 puffs into the lungs every 6 (six) hours as needed for wheezing or shortness of breath. Patient not taking: Reported on 12/27/2023 07/29/23   Mardene Shake, FNP  eletriptan  (RELPAX ) 20 MG tablet Take 1 tablet (20 mg total) by mouth as needed for migraine or headache. May repeat in 2 hours if headache persists or recurs.  No more than 2 tablets in a 24-hour period. 12/27/23   Merriam Abbey, DO  gabapentin  (NEURONTIN ) 100 MG capsule take 2 capsules at bedtime 01/21/24   Jaffe, Adam R, DO   ipratropium (ATROVENT ) 0.03 % nasal spray Place 2 sprays into both nostrils every 12 (twelve) hours. 07/29/23   Mardene Shake, FNP  losartan -hydrochlorothiazide  B[place]the [place]) 100-25 MG tablet TAKE 1 TABLET DAILY 12/24/23   Paz, Jose E, MD  metoprolol  succinate (TOPROL -XL) 50 MG 24 hr tablet Take 1 tablet (50 mg total) by mouth daily. 01/28/24   Millicent Ally, MD  Multiple Vitamin (MULTIVITAMIN WITH MINERALS) TABS tablet Take 1 tablet by mouth daily.    [provider]  mupirocin  ointment (BACTROBAN ) 2 % Apply 1 Application topically 2 (two) times daily. 01/14/24   Angelia Kelp, PA-C  predniSONE  (STERAPRED UNI-PAK 21 TAB) 10 MG (21) TBPK tablet 6 day taper; take as directed on package instructions 01/14/24   Angelia Kelp, PA-C  Semaglutide -Weight Management (WEGOVY ) 2.4 MG/0.75ML SOAJ Inject 2.4 mg into the skin once a week. 11/13/23   Paz, Jose E, MD  sildenafil  (REVATIO ) 20 MG tablet Take 3-4 tablets (60-80 mg total) by mouth at bedtime as needed. 01/27/24   Ezell Hollow, MD    Family History    Family History  Problem Relation Age of Onset   Breast cancer Mother        lumpectomy, XRT, chemo   Diabetes Mother    Hypertension Mother    Prostate cancer Father 84       ~ 66 y/o   Heart disease Father 2       MI    Colon cancer Neg Hx    He indicated that his mother is deceased. He indicated that his father is deceased. He indicated that the status of his neg hx is unknown.  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  Tobacco Use   Smoking status: Never   Smokeless tobacco: Never  Vaping Use   Vaping status: Never Used  Substance and Sexual Activity   Alcohol use: Yes    Alcohol/week: 0.0 standard  drinks of alcohol    Comment: Social   Drug use: No   Sexual activity: Not on file  Other Topics Concern   Not on file  Social History Narrative   2 daughters, one married lives in [place], other is  married, lives in [place]    Right handed    Two story home   Caffeine 1-2 weekly       Social Drivers of Health   Financial Resource Strain: Low Risk  (11/06/2023)   Overall Financial Resource Strain (CARDIA)    Difficulty of Paying Living Expenses: Not  hard at all  Food Insecurity: No Food Insecurity (11/06/2023)   Hunger Vital Sign    Worried About Running Out of Food in the Last Year: Never true    Ran Out of Food in the Last Year: Never true  Transportation Needs: No Transportation Needs (11/06/2023)   PRAPARE - Administrator, Civil Service (Medical): No    Lack of Transportation (Non-Medical): No  Physical Activity: Sufficiently Active (11/06/2023)   Exercise Vital Sign    Days of Exercise per Week: 4 days    Minutes of Exercise per Session: 40 min  Stress: Stress Concern Present (11/06/2023)   [employer] of Occupational Health - Occupational Stress Questionnaire    Feeling of Stress : To some extent  Social Connections: Socially Integrated (11/06/2023)   Social Connection and Isolation Panel [NHANES]    Frequency of Communication with Friends and Family: More than three times a week    Frequency of Social Gatherings with Friends and Family: More than three times a week    Attends Religious Services: More than 4 times per year    Active Member of [employer] or Organizations: Yes    Attends Engineer, structural: More than 4 times per year    Marital Status: Married  Catering manager Violence: Not on file     Review of Systems    General:  No chills, fever, night sweats or weight changes.  Cardiovascular:  No chest pain, dyspnea on exertion, edema, orthopnea, palpitations, paroxysmal nocturnal dyspnea. Dermatological: No rash,  lesions/masses Respiratory: No cough, dyspnea Urologic: No hematuria, dysuria Abdominal:   No nausea, vomiting, diarrhea, bright red blood per rectum, melena, or hematemesis Neurologic:  No visual changes, wkns, changes in mental status. All other systems reviewed and are otherwise negative except as noted above.  Physical Exam    VS:  BP 134/82   Pulse 62   Ht 5\' 9"  (1.753 m)   Wt 239 lb 12.8 oz (108.8 kg)   SpO2 96%   BMI 35.41 kg/m  , BMI Body mass index is 35.41 kg/m. GEN: Well nourished, well developed, in no acute distress. HEENT: normal. Neck: Supple, no JVD, carotid bruits, or masses. Cardiac: RRR, no murmurs, rubs, or gallops. No clubbing, cyanosis, edema.  Radials/DP/PT 2+ and equal bilaterally.  Respiratory:  Respirations regular and unlabored, clear to auscultation bilaterally. GI: Soft, nontender, nondistended, BS + x 4. MS: no deformity or atrophy. Skin: warm and dry, no rash. Neuro:  Strength and sensation are intact. Psych: Normal affect.  Accessory Clinical Findings    Recent Labs: 07/09/2023: ALT 19; BUN 19; Creatinine, Ser 0.88; Hemoglobin 15.9; Platelets 318.0; Potassium 4.2; Sodium 141   Recent Lipid Panel    Component Value Date/Time   CHOL 184 07/09/2023 1010   TRIG 138.0 07/09/2023 1010   HDL 57.50 07/09/2023 1010   CHOLHDL 3 07/09/2023 1010   VLDL 27.6 07/09/2023 1010   LDLCALC 99 07/09/2023 1010   LDLCALC 86 06/20/2020 1347   LDLDIRECT 117.0 11/10/2021 0829         ECG personally reviewed by me today-   None today.   Coronary CTA 12/29/2021: Impressions: 1. Coronary calcium  score of 0. This was 0 percentile for age-, race-, and sex-matched controls. 2. Normal coronary origin with right dominance. 3. No evidence of CAD.  CAD-RADS 0.  Echocardiogram 01/18/2022  IMPRESSIONS     1. Left ventricular ejection fraction, by estimation, is 60 to 65%. The  left ventricle has normal function.  The left ventricle has no regional  wall motion  abnormalities. There is mild concentric left ventricular  hypertrophy. Left ventricular diastolic  parameters were normal.   2. Right ventricular systolic function is normal. The right ventricular  size is normal. There is normal pulmonary artery systolic pressure. The  estimated right ventricular systolic pressure is 28.4 mmHg.   3. Left atrial size was mildly dilated.   4. The mitral valve is normal in structure. No evidence of mitral valve  regurgitation. No evidence of mitral stenosis.   5. The aortic valve is tricuspid. There is mild calcification of the  aortic valve. Aortic valve regurgitation is not visualized. Aortic valve  sclerosis/calcification is present, without any evidence of aortic  stenosis.   6. The inferior vena cava is normal in size with greater than 50%  respiratory variability, suggesting right atrial pressure of 3 mmHg.   FINDINGS   Left Ventricle: Left ventricular ejection fraction, by estimation, is 60  to 65%. The left ventricle has normal function. The left ventricle has no  regional wall motion abnormalities. The left ventricular internal cavity  size was normal in size. There is   mild concentric left ventricular hypertrophy. Left ventricular diastolic  parameters were normal.   Right Ventricle: The right ventricular size is normal. No increase in  right ventricular wall thickness. Right ventricular systolic function is  normal. There is normal pulmonary artery systolic pressure. The tricuspid  regurgitant velocity is 2.52 m/s, and   with an assumed right atrial pressure of 3 mmHg, the estimated right  ventricular systolic pressure is 28.4 mmHg.   Left Atrium: Left atrial size was mildly dilated.   Right Atrium: Right atrial size was normal in size.   Pericardium: There is no evidence of pericardial effusion.   Mitral Valve: The mitral valve is normal in structure. No evidence of  mitral valve regurgitation. No evidence of mitral valve stenosis.    Tricuspid Valve: The tricuspid valve is normal in structure. Tricuspid  valve regurgitation is trivial. No evidence of tricuspid stenosis.   Aortic Valve: The aortic valve is tricuspid. There is mild calcification  of the aortic valve. Aortic valve regurgitation is not visualized. Aortic  valve sclerosis/calcification is present, without any evidence of aortic  stenosis.   Pulmonic Valve: The pulmonic valve was normal in structure. Pulmonic valve  regurgitation is trivial. No evidence of pulmonic stenosis.   Aorta: The aortic root is normal in size and structure.   Venous: The inferior vena cava is normal in size with greater than 50%  respiratory variability, suggesting right atrial pressure of 3 mmHg.   IAS/Shunts: No atrial level shunt detected by color flow Doppler.    Assessment & Plan   1.  Paroxysmal SVT- HR today 62 bpm.  Denies recent episodes of accelerated or irregular heartbeat.  Echocardiogram 01/18/2022 showed an LVEF of 60 to 65% normal diastolic parameters, mild concentric LVH, mildly dilated left atria and no significant valvular abnormalities. Avoid triggers caffeine, chocolate, EtOH, dehydration etc. Reviewed vagal maneuvers Continue metoprolol   Essential hypertension-BP today 134/82. Maintain blood pressure log Continue  HCTZ, metoprolol  Stop losartan  Start hydrochlorothiazide  12.5 Heart healthy low-sodium diet BMP in 1-2 weeks  Hyperlipidemia-LDL 99 on 07/09/23. High-fiber diet Increase physical activity as tolerated  Snoring-No longer snores.  Sleep hygiene instructions Avoid supine sleeping   Disposition: Follow-up with Dr. Chancy Comber or me in 12 months.   Chet Cota. Duvall Comes NP-C     03/06/2024, 9:33 AM [facility] [address] Office [phone] Fax [phone]    I spent 14 minutes examining this patient, reviewing medications, and using patient centered shared decision making involving their  cardiac care.   I spent  20 minutes reviewing past medical history,  medications, and prior cardiac tests.

## 2024-03-06 ENCOUNTER — Ambulatory Visit: Attending: General Practice | Admitting: General Practice

## 2024-03-06 ENCOUNTER — Encounter: Payer: Self-pay | Admitting: General Practice

## 2024-03-06 VITALS — BP 134/82 | HR 62 | Ht 69.0 in | Wt 239.8 lb

## 2024-03-06 DIAGNOSIS — E782 Mixed hyperlipidemia: Secondary | ICD-10-CM

## 2024-03-06 DIAGNOSIS — R0683 Snoring: Secondary | ICD-10-CM

## 2024-03-06 DIAGNOSIS — I471 Supraventricular tachycardia, unspecified: Secondary | ICD-10-CM | POA: Diagnosis not present

## 2024-03-06 DIAGNOSIS — I10 Essential (primary) hypertension: Secondary | ICD-10-CM

## 2024-03-06 MED ORDER — HYDROCHLOROTHIAZIDE 12.5 MG PO CAPS
12.5000 mg | ORAL_CAPSULE | Freq: Every day | ORAL | 3 refills | Status: DC
Start: 2024-03-06 — End: 2024-07-15

## 2024-03-06 NOTE — Patient Instructions (Signed)
 Medication Instructions:  Your physician has recommended you make the following change in your medication:  START HYDROCHLOROTHIAZIDE   12.5 MG DAILY.  STOP LOSARTAN   *If you need a refill on your cardiac medications before your next appointment, please call your pharmacy*  Lab Work: TO BE DONE IN 1-2 WEEKS: BMET  If you have labs (blood work) drawn today and your tests are completely normal, you will receive your results only by: MyChart Message (if you have MyChart) OR A paper copy in the mail If you have any lab test that is abnormal or we need to change your treatment, we will call you to review the results.  Testing/Procedures: NONE  Follow-Up: At [facility], you and your health needs are our priority.  As part of our continuing mission to provide you with exceptional heart care, our providers are all part of one team.  This team includes your primary Cardiologist (physician) and Advanced Practice Providers or APPs (Physician Assistants and Nurse Practitioners) who all work together to provide you with the care you need, when you need it.  Your next appointment:   1 year(s)  Provider:   One of our Advanced Practice Providers (APPs): Melita Springer, PA-C  Friddie Jetty, NP Evaline Hill, NP  Theotis Flake, PA-C Lawana Pray, NP  Willis Harter, PA-C Lovette Rud, PA-C  Junior, PA-C Ernest Dick, NP  Marlana Silvan, NP Marcie Sever, PA-C  Laquita Plant, PA-C    Dayna Dunn, PA-C  Scott Weaver, PA-C Palmer Bobo, NP Katlyn West, NP Callie Goodrich, PA-C  Evan Williams, PA-C Sheng Haley, PA-C  Xika Zhao, NP Kathleen Johnson, PA-C    We recommend signing up for the patient portal called "MyChart".  Sign up information is provided on this After Visit Summary.  MyChart is used to connect with patients for Virtual Visits (Telemedicine).  Patients are able to view lab/test results, encounter notes, upcoming appointments, etc.  Non-urgent messages can be sent to your  provider as well.   To learn more about what you can do with MyChart, go to [email or web address].   Other Instructions

## 2024-03-16 ENCOUNTER — Encounter: Payer: Self-pay | Admitting: Internal Medicine

## 2024-03-16 ENCOUNTER — Other Ambulatory Visit: Payer: Self-pay

## 2024-03-16 MED ORDER — ELETRIPTAN HYDROBROMIDE 20 MG PO TABS: 20.0000 mg | ORAL_TABLET | ORAL | 5 refills | Status: AC | PRN

## 2024-03-17 ENCOUNTER — Encounter: Payer: Self-pay | Admitting: Internal Medicine

## 2024-03-18 MED ORDER — WEGOVY 2.4 MG/0.75ML ~~LOC~~ SOAJ: 2.4000 mg | SUBCUTANEOUS | 1 refills | Status: DC

## 2024-03-20 MED ORDER — METOPROLOL SUCCINATE ER 50 MG PO TB24: 50.0000 mg | ORAL_TABLET | Freq: Every day | ORAL | 3 refills | Status: AC

## 2024-03-30 ENCOUNTER — Other Ambulatory Visit (HOSPITAL_BASED_OUTPATIENT_CLINIC_OR_DEPARTMENT_OTHER): Payer: Self-pay

## 2024-03-30 MED ORDER — WEGOVY 2.4 MG/0.75ML ~~LOC~~ SOAJ
2.4000 mg | SUBCUTANEOUS | 0 refills | Status: DC
  Filled 2024-03-30: qty 3, 28d supply, fill #0

## 2024-04-13 ENCOUNTER — Ambulatory Visit: Payer: BC Managed Care – PPO | Admitting: Internal Medicine

## 2024-04-14 ENCOUNTER — Ambulatory Visit (INDEPENDENT_AMBULATORY_CARE_PROVIDER_SITE_OTHER): Admitting: Internal Medicine

## 2024-04-14 ENCOUNTER — Telehealth: Payer: Self-pay | Admitting: *Deleted

## 2024-04-14 ENCOUNTER — Encounter: Payer: Self-pay | Admitting: Internal Medicine

## 2024-04-14 VITALS — BP 156/100 | HR 59 | Temp 97.9°F | Resp 12 | Ht 69.0 in | Wt 239.0 lb

## 2024-04-14 DIAGNOSIS — I10 Essential (primary) hypertension: Secondary | ICD-10-CM

## 2024-04-14 DIAGNOSIS — E669 Obesity, unspecified: Secondary | ICD-10-CM | POA: Diagnosis not present

## 2024-04-14 MED ORDER — ZEPBOUND 10 MG/0.5ML ~~LOC~~ SOAJ: 10.0000 mg | SUBCUTANEOUS | 2 refills | Status: DC

## 2024-04-14 MED ORDER — TIRZEPATIDE-WEIGHT MANAGEMENT 10 MG/0.5ML ~~LOC~~ SOLN: 10.0000 mg | SUBCUTANEOUS | 2 refills | Status: DC

## 2024-04-14 MED ORDER — LOSARTAN POTASSIUM 100 MG PO TABS
100.0000 mg | ORAL_TABLET | Freq: Every day | ORAL | 3 refills | Status: DC
Start: 2024-04-14 — End: 2024-07-15

## 2024-04-14 NOTE — Telephone Encounter (Signed)
Pens sent in. 

## 2024-04-14 NOTE — Telephone Encounter (Signed)
 Copied from CRM [id number]. Topic: Clinical - Medication Question >> Apr 14, 2024  3:17 PM [name] wrote: Reason for CRM: Christian Ramos w/ [facility] called in stating Dr. [name] called in tirzepatide 10 MG/0.5ML injection vial, but it's not available and would like to know if it's for mounjaro or zepbound. Call [phone]

## 2024-04-14 NOTE — Patient Instructions (Addendum)
 Add losartan  100 mg 1 tablet daily  Stop Wegovy  Start Zepbound 10 mg weekly.  See how that works.   We can increase the dose next month if necessary.   Continue watching your diet closely, try to state around 1800 cal daily.  Check the  blood pressure regularly Blood pressure goal:  between 110/65 and  135/85. If it is consistently higher or lower, let me know  Go to the front desk: Arrange for blood work to be done in 2 weeks Arrange for physical exam with me by 06-2024.

## 2024-04-14 NOTE — Progress Notes (Signed)
 Subjective:    Patient ID: Christian Ramos, male    DOB: 1961/05/15, 63 y.o.   MRN: [id number]  DOS:  04/14/2024 Type of visit - description: Follow-up  Chronic medical problems addressed, Saw cardiology, note reviewed.  BP meds adjusted.  Feels well however has not been able to lose more weight. Denies nausea vomiting.  No diarrhea or constipation.  Wt Readings from Last 3 Encounters:  04/14/24 239 lb (108.4 kg)  03/06/24 239 lb 12.8 oz (108.8 kg)  12/27/23 242 lb (109.8 kg)     Review of Systems See above   Past Medical History:  Diagnosis Date   Anosmia    Hypertension    Obesity (BMI 30-39.9)    Vitreous hemorrhage (HCC) 09/2018   R    Past Surgical History:  Procedure Laterality Date   NECK SURGERY  ~ 2000   mass removed anteriorly, Dr Jo Mouse   VASECTOMY  ~2000    Current Outpatient Medications  Medication Instructions   albuterol  (VENTOLIN  HFA) 108 (90 Base) MCG/ACT inhaler 2 puffs, Inhalation, Every 6 hours PRN   amoxicillin  (AMOXIL ) 250 mg, As needed   chlorhexidine (PERIDEX) 0.12 % solution As needed   eletriptan  (RELPAX ) 20 mg, Oral, As needed, May repeat in 2 hours if headache persists or recurs.  No more than 2 tablets in a 24-hour period.   fluconazole (DIFLUCAN) 200 mg, 2 times weekly   gabapentin  (NEURONTIN ) 100 MG capsule take 2 capsules at bedtime   hydrochlorothiazide  (MICROZIDE ) 12.5 mg, Oral, Daily   HYDROcodone-acetaminophen  (NORCO/VICODIN) 5-325 MG tablet 1 tablet, Every 6 hours PRN   ipratropium (ATROVENT ) 0.03 % nasal spray 2 sprays, Each Nare, Every 12 hours   ketoconazole (NIZORAL) 2 % cream As needed   loratadine  (CLARITIN ) 10 mg, Daily PRN   metoprolol  succinate (TOPROL -XL) 50 mg, Oral, Daily   Multiple Vitamin (MULTIVITAMIN WITH MINERALS) TABS tablet 1 tablet, Daily   mupirocin  ointment (BACTROBAN ) 2 % 1 Application, Topical, 2 times daily   predniSONE  (STERAPRED UNI-PAK 21 TAB) 10 MG (21) TBPK tablet 6 day taper; take as directed  on package instructions   sildenafil  (REVATIO ) 60-80 mg, Oral, At bedtime PRN   Wegovy  2.4 mg, Subcutaneous, Weekly       Objective:   Physical Exam BP (!) 152/102 (BP Location: Left Arm, Patient Position: Sitting, Cuff Size: Normal)   Pulse (!) 56   Temp 97.9 F (36.6 C) (Oral)   Resp 12   Ht 5' 9 (1.753 m)   Wt 239 lb (108.4 kg)   SpO2 97%   BMI 35.29 kg/m  General:   Well developed, NAD, BMI noted. HEENT:  Normocephalic . Face symmetric, atraumatic Lungs:  CTA B Normal respiratory effort, no intercostal retractions, no accessory muscle use. Heart: RRR,  no murmur.  Lower extremities: no pretibial edema bilaterally  Skin: Not pale. Not jaundice Neurologic:  alert & oriented X3.  Speech normal, gait appropriate for age and unassisted Psych--  Cognition and judgment appear intact.  Cooperative with normal attention span and concentration.  Behavior appropriate. No anxious or depressed appearing.      Assessment      Problem list (transfer from [facility] 04/17/2016) HTN  Obesity-- Rx Wegovy  02/12/2022.  Initial weight 258 pounds, BMI 39  Exercise  induced asthma OSA  HA-anosmia: saw neuro, last OV 11-2016; MRI (-), likely cervicogenic HA, pt declined Gabapentin , RX muscle relaxants, PT R Vitreous hemorrhage 09/2018 SVT Dx 12-2021.   PLAN Obesity: On Wegovy  since April  2023, initial weight 258 pounds, current weight 239 pounds, BMI 35. The patient wt loss has plateau on Wegovy  maximal dose despite eating healthy (most days 1800 cal, occasionally 2200 cal) and stay much more active.  Change medication? Plan: Switch to Zepbound 10 mg weekly, continue with his healthy lifestyle, we could increase Zepbound gradually if needed. Check TSH HTN: Saw cardiology 03/06/2024, losartan  HCT stopped, started HCTZ 12.5 daily. Since then, BP has not been as well-controlled as before.  At home it ranges from the 120s frequently in the 140s over 90.  BP today is elevated.  Plan:  Continue HCTZ 12.5 mg, losartan  100 mg, monitor BPs, BMP in 2 weeks. Migraines: Saw neurology 12/27/2023, restarted gabapentin , eletriptan  for rescue. SVT: LOV cardiology 03/06/2024. RTC labs 2 weeks RTC CPX 06/2024

## 2024-04-14 NOTE — Assessment & Plan Note (Signed)
 Obesity: On Wegovy  since April 2023, initial weight 258 pounds, current weight 239 pounds, BMI 35. The patient wt loss has plateau on Wegovy  maximal dose despite eating healthy (most days 1800 cal, occasionally 2200 cal) and stay much more active.  Change medication? Plan: Switch to Zepbound 10 mg weekly, continue with his healthy lifestyle, we could increase Zepbound gradually if needed. Check TSH HTN: Saw cardiology 03/06/2024, losartan  HCT stopped, started HCTZ 12.5 daily. Since then, BP has not been as well-controlled as before.  At home it ranges from the 120s frequently in the 140s over 90.  BP today is elevated.  Plan: Continue HCTZ 12.5 mg, losartan  100 mg, monitor BPs, BMP in 2 weeks. Migraines: Saw neurology 12/27/2023, restarted gabapentin , eletriptan  for rescue. SVT: LOV cardiology 03/06/2024. RTC labs 2 weeks RTC CPX 06/2024

## 2024-04-15 ENCOUNTER — Telehealth: Payer: Self-pay

## 2024-04-15 ENCOUNTER — Other Ambulatory Visit (HOSPITAL_COMMUNITY): Payer: Self-pay

## 2024-04-15 NOTE — Telephone Encounter (Signed)
 Pharmacy Patient Advocate Encounter   Received notification from CoverMyMeds that prior authorization for Zepbound 10 is required/requested.   Insurance verification completed.   The patient is insured through [employer] .   Per test claim: PA required; PA submitted to above mentioned insurance via CoverMyMeds Key/confirmation #/EOC [id number] Status is pending

## 2024-04-17 ENCOUNTER — Other Ambulatory Visit (HOSPITAL_COMMUNITY): Payer: Self-pay

## 2024-04-23 ENCOUNTER — Other Ambulatory Visit (HOSPITAL_COMMUNITY): Payer: Self-pay

## 2024-04-27 ENCOUNTER — Encounter: Payer: Self-pay | Admitting: Internal Medicine

## 2024-04-27 NOTE — Telephone Encounter (Signed)
Can someone check status please

## 2024-04-27 NOTE — Telephone Encounter (Signed)
 Pharmacy Patient Advocate Encounter  Received notification from [employer] that Prior Authorization for ZEPBOUND  has been DENIED.  Full denial letter will be uploaded to the media tab. See denial reason below.   PA #/Case ID/Reference #: [id number]

## 2024-04-29 ENCOUNTER — Other Ambulatory Visit

## 2024-05-05 ENCOUNTER — Other Ambulatory Visit: Payer: Self-pay | Admitting: Internal Medicine

## 2024-05-05 ENCOUNTER — Other Ambulatory Visit (HOSPITAL_BASED_OUTPATIENT_CLINIC_OR_DEPARTMENT_OTHER): Payer: Self-pay

## 2024-05-06 ENCOUNTER — Other Ambulatory Visit (HOSPITAL_BASED_OUTPATIENT_CLINIC_OR_DEPARTMENT_OTHER): Payer: Self-pay

## 2024-05-06 MED ORDER — WEGOVY 0.25 MG/0.5ML ~~LOC~~ SOAJ
0.2500 mg | SUBCUTANEOUS | 0 refills | Status: DC
Start: 2024-05-06 — End: 2024-05-07
  Filled 2024-05-06: qty 2, 28d supply, fill #0

## 2024-05-06 NOTE — Addendum Note (Signed)
 Addended by: Jobanny Mavis D on: 05/06/2024 04:10 PM   Modules accepted: Orders

## 2024-05-06 NOTE — Telephone Encounter (Signed)
 Current Zepbound  10 mg. Will switch back to semaglutide  1 mg once weekly, send in a month supply with refills

## 2024-05-07 ENCOUNTER — Other Ambulatory Visit (HOSPITAL_BASED_OUTPATIENT_CLINIC_OR_DEPARTMENT_OTHER): Payer: Self-pay

## 2024-05-07 MED ORDER — WEGOVY 2.4 MG/0.75ML ~~LOC~~ SOAJ
2.4000 mg | SUBCUTANEOUS | 3 refills | Status: DC
  Filled 2024-05-07: qty 3, 28d supply, fill #0
  Filled 2024-06-03 – 2024-06-04 (×2): qty 3, 28d supply, fill #1
  Filled 2024-07-15: qty 3, 28d supply, fill #2
  Filled 2024-08-06 (×2): qty 3, 28d supply, fill #3

## 2024-05-07 NOTE — Addendum Note (Signed)
 Addended by: Velmer Broadfoot D on: 05/07/2024 09:42 AM   Modules accepted: Orders

## 2024-05-18 ENCOUNTER — Encounter: Payer: Self-pay | Admitting: Internal Medicine

## 2024-06-03 ENCOUNTER — Other Ambulatory Visit (HOSPITAL_BASED_OUTPATIENT_CLINIC_OR_DEPARTMENT_OTHER): Payer: Self-pay

## 2024-06-04 ENCOUNTER — Other Ambulatory Visit (HOSPITAL_BASED_OUTPATIENT_CLINIC_OR_DEPARTMENT_OTHER): Payer: Self-pay

## 2024-06-15 NOTE — Progress Notes (Deleted)
 NEUROLOGY FOLLOW UP OFFICE NOTE  CARLISLE ENKE [id number]  Assessment/Plan:   Cervicogenic migraine Right sided occipital neuralgia Hypertension    Headache prevention:  Gabapentin  at 200mg  at bedtime *** Migraine rescue:  Eletriptan  20mg  for abortive therapy.  Continue home neck exercises/stretches Limit use of pain relievers to no more than 2 days out of week to prevent risk of rebound or medication-overuse headache. Keep headache diary Follow up ***     Subjective:  Tehran Rabenold is a 63 year old right-handed man hypertension who follows up for cervicogenic headache    UPDATE: Restarted gabapentin .  Intensity:  moderate Duration:  62 hour with eletriptan  Frequency:  2-3 times a week.    Current NSAIDS/analgesics:  none Current triptans:  eletriptan  20mg  Current ergotamine:  none Current anti-emetic:  none Current muscle relaxants:  none Current Antihypertensive medications:  metoprolol  succinate, Hyzaar Current Antidepressant medications:  none Current Anticonvulsant medications:  gabapentin  200mg  at bedtime Current anti-CGRP:  none Current Vitamins/Herbal/Supplements:  none Current Antihistamines/Decongestants: none Other therapy:  home neck exercises/stretches Hormone/birth control:  none  Caffeine:  no Alcohol:  3 to 5 drinks a week Smoker:  no Diet:  60 oz water Exercise:  yes Depression/stress:  no   HISTORY: In 2018, he reported onset of new headache 6-7/10 non-throbbing/pressure pain around 2016.  Starts at base of right side of head and radiates to right temporal-parietal region.  He calls it a pressure pain.  No numbness or tingling.  He denies pain radiating down the neck or into the arm.  When he moves his neck, he feels crunching.  No aura.  Associated bilateral blurred vision, photophobia, phonophobia and a little lightheaded.  No nausea.  At that time, he reported headaches lasted 12 hours and occurred 3 to 4 days a week.  No triggers or  relieving factors.  Around the same time, he reported anosmia.  MRI of brain with and without contrast was performed on 09/26/16 to rule out mass lesion or other abnormality affecting the olfactory bulbs was personally reviewed and was normal.  Would treat with ibuprofen or Excedrin.  Tizanidine  at night helped.  He was prescribed gabapentin  but never started it as he was concerned about taking an anti-seizure medication.  He saw a spine surgeon who ordered X-rays as well as MRI which reportedly showed no structural cause for headaches.  I do not have these notes, images and patient does not remember who he saw.     He continued having headaches.  CT head on 06/20/2020 personally reviewed was normal.  By late 2022, they became frequent.  They would occur 10-20 days a month.  Sometimes he would be without headache for 5 days and then headaches would recur for several days.  He hasn't had a headache in 2 weeks.  He tried eletriptan , which aborts the headache quickly.  Prednisone  taper was ineffective.      Past NSAIDS:  no Past analgesics:  no Past abortive triptans:  no Past muscle relaxants:  tizanidine  4mg  PRN Past anti-emetic:  no Past antihypertensive medications:  Prinzide, amlodipine , HCTZ Past antidepressant medications:  no Past anticonvulsant medications:  none Past vitamins/Herbal/Supplements:  no Past anthistamines/decongestants:   Claritin , Astelin  NS Other past therapies:  physical therapy (neck pain -effective)      No personal history of headache. Denies head injury prior to onset of symptoms  PAST MEDICAL HISTORY: Past Medical History:  Diagnosis Date   Anosmia    Hypertension    Obesity (  BMI 30-39.9)    Vitreous hemorrhage (HCC) 09/2018   R    MEDICATIONS: Current Outpatient Medications on File Prior to Visit  Medication Sig Dispense Refill   albuterol  (VENTOLIN  HFA) 108 (90 Base) MCG/ACT inhaler Inhale 2 puffs into the lungs every 6 (six) hours as needed for  wheezing or shortness of breath. 8 g 0   chlorhexidine (PERIDEX) 0.12 % solution as needed.     eletriptan  (RELPAX ) 20 MG tablet Take 1 tablet (20 mg total) by mouth as needed for migraine or headache. May repeat in 2 hours if headache persists or recurs.  No more than 2 tablets in a 24-hour period. 10 tablet 5   fluconazole (DIFLUCAN) 200 MG tablet Take 200 mg by mouth 2 (two) times a week.     gabapentin  (NEURONTIN ) 100 MG capsule take 2 capsules at bedtime (Patient taking differently: as needed. take 2 capsules at bedtime) 180 capsule 0   hydrochlorothiazide  (MICROZIDE ) 12.5 MG capsule Take 1 capsule (12.5 mg total) by mouth daily. 90 capsule 3   ipratropium (ATROVENT ) 0.03 % nasal spray Place 2 sprays into both nostrils every 12 (twelve) hours. (Patient taking differently: Place 2 sprays into both nostrils as needed.) 30 mL 12   ketoconazole (NIZORAL) 2 % cream Apply topically as needed.     loratadine  (CLARITIN ) 10 MG tablet Take 10 mg by mouth daily as needed.     losartan  (COZAAR ) 100 MG tablet Take 1 tablet (100 mg total) by mouth daily. 30 tablet 3   metoprolol  succinate (TOPROL -XL) 50 MG 24 hr tablet Take 1 tablet (50 mg total) by mouth daily. 90 tablet 3   Multiple Vitamin (MULTIVITAMIN WITH MINERALS) TABS tablet Take 1 tablet by mouth daily.     mupirocin  ointment (BACTROBAN ) 2 % Apply 1 Application topically 2 (two) times daily. (Patient taking differently: Apply 1 Application topically as needed.) 22 g 0   semaglutide -weight management (WEGOVY ) 2.4 MG/0.75ML SOAJ SQ injection Inject 2.4 mg into the skin once a week. 3 mL 3   sildenafil  (REVATIO ) 20 MG tablet Take 3-4 tablets (60-80 mg total) by mouth at bedtime as needed. 30 tablet 3   No current facility-administered medications on file prior to visit.    ALLERGIES: No Known Allergies  FAMILY HISTORY: Family History  Problem Relation Age of Onset   Breast cancer Mother        lumpectomy, XRT, chemo   Diabetes Mother     Hypertension Mother    Prostate cancer Father 73       ~ 21 y/o   Heart disease Father 42       MI    Colon cancer Neg Hx       Objective:  *** General: No acute distress.  Patient appears well-groomed.   Head:  Normocephalic/atraumatic Eyes:  Fundi examined but not visualized Neck: supple, right suboccipital and upper cervical paraspinal tenderness, full range of motion *** Heart:  Regular rate and rhythm Neurological Exam: alert and oriented.  Speech fluent and not dysarthric, language intact.  CN II-XII intact. Bulk and tone normal, muscle strength 5/5 throughout.  Sensation to light touch intact.  Deep tendon reflexes 2+ throughout, toes downgoing.  Finger to nose testing intact.  Gait normal, Romberg negative.  Juliene Dunnings, DO  CC: Aloysius Mech, MD

## 2024-06-17 ENCOUNTER — Ambulatory Visit: Admitting: Neurology

## 2024-06-24 ENCOUNTER — Ambulatory Visit: Payer: BC Managed Care – PPO | Admitting: Neurology

## 2024-07-15 ENCOUNTER — Ambulatory Visit (INDEPENDENT_AMBULATORY_CARE_PROVIDER_SITE_OTHER): Admitting: Internal Medicine

## 2024-07-15 ENCOUNTER — Encounter: Payer: Self-pay | Admitting: Internal Medicine

## 2024-07-15 ENCOUNTER — Other Ambulatory Visit (HOSPITAL_BASED_OUTPATIENT_CLINIC_OR_DEPARTMENT_OTHER): Payer: Self-pay

## 2024-07-15 VITALS — BP 126/80 | HR 65 | Temp 97.8°F | Resp 16 | Ht 69.0 in | Wt 235.1 lb

## 2024-07-15 DIAGNOSIS — I10 Essential (primary) hypertension: Secondary | ICD-10-CM

## 2024-07-15 MED ORDER — LOSARTAN POTASSIUM-HCTZ 100-25 MG PO TABS: 1.0000 | ORAL_TABLET | Freq: Every day | ORAL | 1 refills | Status: AC

## 2024-07-15 NOTE — Progress Notes (Signed)
 Subjective:    Patient ID: ARDIE MCLENNAN, male    DOB: [birth year], 62 y.o.   MRN: [id number]  DOS:  07/15/2024 Type of visit - description: Follow-up  Follow-up from previous visit. Feeling great.  Has no concerns Wt Readings from Last 3 Encounters:  07/15/24 235 lb 2 oz (106.7 kg)  04/14/24 239 lb (108.4 kg)  03/06/24 239 lb 12.8 oz (108.8 kg)     Review of Systems See above   Past Medical History:  Diagnosis Date   Anosmia    Hypertension    Obesity (BMI 30-39.9)    Vitreous hemorrhage (HCC) 09/2018   R    Past Surgical History:  Procedure Laterality Date   NECK SURGERY  ~ 2000   mass removed anteriorly, Dr Neville   VASECTOMY  ~2000    Current Outpatient Medications  Medication Instructions   albuterol  (VENTOLIN  HFA) 108 (90 Base) MCG/ACT inhaler 2 puffs, Inhalation, Every 6 hours PRN   chlorhexidine (PERIDEX) 0.12 % solution As needed   eletriptan  (RELPAX ) 20 mg, Oral, As needed, May repeat in 2 hours if headache persists or recurs.  No more than 2 tablets in a 24-hour period.   fluconazole (DIFLUCAN) 200 mg, 2 times weekly   gabapentin  (NEURONTIN ) 100 MG capsule take 2 capsules at bedtime   hydrochlorothiazide  (MICROZIDE ) 12.5 mg, Oral, Daily   ipratropium (ATROVENT ) 0.03 % nasal spray 2 sprays, Each Nare, Every 12 hours   ketoconazole (NIZORAL) 2 % cream As needed   loratadine  (CLARITIN ) 10 mg, Daily PRN   losartan  (COZAAR ) 100 mg, Oral, Daily   metoprolol  succinate (TOPROL -XL) 50 mg, Oral, Daily   Multiple Vitamin (MULTIVITAMIN WITH MINERALS) TABS tablet 1 tablet, Daily   mupirocin  ointment (BACTROBAN ) 2 % 1 Application, Topical, 2 times daily   sildenafil  (REVATIO ) 60-80 mg, Oral, At bedtime PRN   Wegovy  2.4 mg, Subcutaneous, Weekly       Objective:   Physical Exam BP 126/80   Pulse 65   Temp 97.8 F (36.6 C) (Oral)   Resp 16   Ht 5' 9 (1.753 m)   Wt 235 lb 2 oz (106.7 kg)   SpO2 97%   BMI 34.72 kg/m  General:   Well developed, NAD, BMI  noted. HEENT:  Normocephalic . Face symmetric, atraumatic Lungs:  CTA B Normal respiratory effort, no intercostal retractions, no accessory muscle use. Heart: RRR,  no murmur.  Lower extremities: no pretibial edema bilaterally  Skin: Not pale. Not jaundice Neurologic:  alert & oriented X3.  Speech normal, gait appropriate for age and unassisted Psych--  Cognition and judgment appear intact.  Cooperative with normal attention span and concentration.  Behavior appropriate. No anxious or depressed appearing.      Assessment   Problem list (transfer from [facility] 04/17/2016) HTN  Obesity-- Rx Wegovy  02/12/2022.  Initial weight 258 pounds, BMI 39  Exercise  induced asthma OSA  HA-anosmia: saw neuro, last OV 11-2016; MRI (-), likely cervicogenic HA, pt declined Gabapentin , RX muscle relaxants, PT R Vitreous hemorrhage 09/2018 SVT Dx 12-2021.   PLAN Morbid obesity: See LOV, switch from Wegovy  to Zepbound , but zepbound  was not covered so he is back on Wegovy  2.4 mg/week. Lost 4 pounds.  He continued to take daily walks and eats healthy. Praised  HTN: On the last visit, he was on HCTZ and metoprolol , losartan  was discontinued by cardiology but since then BP was not as well-controlled, losartan  restarted.  Follow-up BMP not pursued by patient. At this point  he is on metoprolol , losartan  HCT 100/25.  Ambulatory BPs range from 111/68 to 130/90.  The great majority of readings are normal.  Plan: No change, refill sent, BMP TSH CBC. Vaccine advice provided: See AVS RTC 4 months CPX

## 2024-07-15 NOTE — Patient Instructions (Signed)
 Vaccines I recommend: Flu and a COVID booster this fall   Continue checking your blood pressure regularly Blood pressure goal:  between 110/65 and  135/85. If it is consistently higher or lower, let me know     GO TO THE LAB :  Get the blood work   Your results will be posted on MyChart with my comments  Go to the front desk for the checkout Please make an appointment for a physical exam in 4 months

## 2024-07-15 NOTE — Assessment & Plan Note (Signed)
 Morbid obesity: See LOV, switch from Wegovy  to Zepbound , but zepbound  was not covered so he is back on Wegovy  2.4 mg/week. Lost 4 pounds.  He continued to take daily walks and eats healthy. Praised  HTN: On the last visit, he was on HCTZ and metoprolol , losartan  was discontinued by cardiology but since then BP was not as well-controlled, losartan  restarted.  Follow-up BMP not pursued by patient. At this point he is on metoprolol , losartan  HCT 100/25.  Ambulatory BPs range from 111/68 to 130/90.  The great majority of readings are normal.  Plan: No change, refill sent, BMP TSH CBC. Vaccine advice provided: See AVS RTC 4 months CPX

## 2024-07-16 ENCOUNTER — Ambulatory Visit: Payer: Self-pay | Admitting: Internal Medicine

## 2024-07-16 LAB — TSH: TSH: 0.52 u[IU]/mL (ref 0.35–5.50)

## 2024-07-16 LAB — CBC WITH DIFFERENTIAL/PLATELET
Basophils Absolute: 0.1 K/uL (ref 0.0–0.1)
Basophils Relative: 0.8 % (ref 0.0–3.0)
Eosinophils Absolute: 0.1 K/uL (ref 0.0–0.7)
Eosinophils Relative: 1.8 % (ref 0.0–5.0)
HCT: 44.7 % (ref 39.0–52.0)
Hemoglobin: 15.4 g/dL (ref 13.0–17.0)
Lymphocytes Relative: 31 % (ref 12.0–46.0)
Lymphs Abs: 2.1 K/uL (ref 0.7–4.0)
MCHC: 34.4 g/dL (ref 30.0–36.0)
MCV: 90.2 fl (ref 78.0–100.0)
Monocytes Absolute: 0.7 K/uL (ref 0.1–1.0)
Monocytes Relative: 11.1 % (ref 3.0–12.0)
Neutro Abs: 3.7 K/uL (ref 1.4–7.7)
Neutrophils Relative %: 55.3 % (ref 43.0–77.0)
Platelets: 291 K/uL (ref 150.0–400.0)
RBC: 4.96 Mil/uL (ref 4.22–5.81)
RDW: 12.7 % (ref 11.5–15.5)
WBC: 6.8 K/uL (ref 4.0–10.5)

## 2024-07-16 LAB — BASIC METABOLIC PANEL WITH GFR
BUN: 13 mg/dL (ref 6–23)
CO2: 29 meq/L (ref 19–32)
Calcium: 9.5 mg/dL (ref 8.4–10.5)
Chloride: 101 meq/L (ref 96–112)
Creatinine, Ser: 0.94 mg/dL (ref 0.40–1.50)
GFR: 86.77 mL/min (ref >60.00)
Glucose, Bld: 81 mg/dL (ref 70–99)
Potassium: 3.8 meq/L (ref 3.5–5.1)
Sodium: 139 meq/L (ref 135–145)

## 2024-08-06 ENCOUNTER — Other Ambulatory Visit (HOSPITAL_BASED_OUTPATIENT_CLINIC_OR_DEPARTMENT_OTHER): Payer: Self-pay

## 2024-08-06 ENCOUNTER — Other Ambulatory Visit (HOSPITAL_COMMUNITY): Payer: Self-pay

## 2024-08-25 DIAGNOSIS — M79672 Pain in left foot: Secondary | ICD-10-CM | POA: Diagnosis not present

## 2024-08-25 DIAGNOSIS — M7662 Achilles tendinitis, left leg: Secondary | ICD-10-CM | POA: Diagnosis not present

## 2024-09-09 DIAGNOSIS — M9901 Segmental and somatic dysfunction of cervical region: Secondary | ICD-10-CM | POA: Diagnosis not present

## 2024-09-09 DIAGNOSIS — M9906 Segmental and somatic dysfunction of lower extremity: Secondary | ICD-10-CM | POA: Diagnosis not present

## 2024-09-09 DIAGNOSIS — M530 Cervicocranial syndrome: Secondary | ICD-10-CM | POA: Diagnosis not present

## 2024-09-10 ENCOUNTER — Other Ambulatory Visit: Payer: Self-pay | Admitting: Internal Medicine

## 2024-09-10 ENCOUNTER — Encounter: Payer: Self-pay | Admitting: Internal Medicine

## 2024-09-11 ENCOUNTER — Other Ambulatory Visit (HOSPITAL_BASED_OUTPATIENT_CLINIC_OR_DEPARTMENT_OTHER): Payer: Self-pay

## 2024-09-11 DIAGNOSIS — M9901 Segmental and somatic dysfunction of cervical region: Secondary | ICD-10-CM | POA: Diagnosis not present

## 2024-09-11 DIAGNOSIS — M25572 Pain in left ankle and joints of left foot: Secondary | ICD-10-CM | POA: Diagnosis not present

## 2024-09-11 DIAGNOSIS — M530 Cervicocranial syndrome: Secondary | ICD-10-CM | POA: Diagnosis not present

## 2024-09-11 DIAGNOSIS — M9906 Segmental and somatic dysfunction of lower extremity: Secondary | ICD-10-CM | POA: Diagnosis not present

## 2024-09-11 MED ORDER — WEGOVY 2.4 MG/0.75ML ~~LOC~~ SOAJ
2.4000 mg | SUBCUTANEOUS | 3 refills | Status: AC
  Filled 2024-09-11: qty 3, 28d supply, fill #0
  Filled 2024-10-06: qty 3, 28d supply, fill #1

## 2024-09-14 ENCOUNTER — Other Ambulatory Visit (HOSPITAL_BASED_OUTPATIENT_CLINIC_OR_DEPARTMENT_OTHER): Payer: Self-pay

## 2024-09-14 DIAGNOSIS — M542 Cervicalgia: Secondary | ICD-10-CM | POA: Diagnosis not present

## 2024-09-14 DIAGNOSIS — M25572 Pain in left ankle and joints of left foot: Secondary | ICD-10-CM | POA: Diagnosis not present

## 2024-09-14 DIAGNOSIS — M9906 Segmental and somatic dysfunction of lower extremity: Secondary | ICD-10-CM | POA: Diagnosis not present

## 2024-09-14 DIAGNOSIS — M9901 Segmental and somatic dysfunction of cervical region: Secondary | ICD-10-CM | POA: Diagnosis not present

## 2024-09-16 DIAGNOSIS — M542 Cervicalgia: Secondary | ICD-10-CM | POA: Diagnosis not present

## 2024-09-16 DIAGNOSIS — M9901 Segmental and somatic dysfunction of cervical region: Secondary | ICD-10-CM | POA: Diagnosis not present

## 2024-09-16 DIAGNOSIS — M9906 Segmental and somatic dysfunction of lower extremity: Secondary | ICD-10-CM | POA: Diagnosis not present

## 2024-09-16 DIAGNOSIS — M25572 Pain in left ankle and joints of left foot: Secondary | ICD-10-CM | POA: Diagnosis not present

## 2024-09-21 DIAGNOSIS — M9906 Segmental and somatic dysfunction of lower extremity: Secondary | ICD-10-CM | POA: Diagnosis not present

## 2024-09-21 DIAGNOSIS — M25572 Pain in left ankle and joints of left foot: Secondary | ICD-10-CM | POA: Diagnosis not present

## 2024-09-21 DIAGNOSIS — M542 Cervicalgia: Secondary | ICD-10-CM | POA: Diagnosis not present

## 2024-09-21 DIAGNOSIS — M9901 Segmental and somatic dysfunction of cervical region: Secondary | ICD-10-CM | POA: Diagnosis not present

## 2024-09-23 ENCOUNTER — Ambulatory Visit: Admitting: Family

## 2024-09-23 ENCOUNTER — Encounter: Payer: Self-pay | Admitting: Family

## 2024-09-23 VITALS — BP 126/82 | HR 76 | Temp 97.5°F | Resp 16 | Ht 69.0 in | Wt 236.0 lb

## 2024-09-23 DIAGNOSIS — E785 Hyperlipidemia, unspecified: Secondary | ICD-10-CM

## 2024-09-23 DIAGNOSIS — Z125 Encounter for screening for malignant neoplasm of prostate: Secondary | ICD-10-CM

## 2024-09-23 DIAGNOSIS — Z0000 Encounter for general adult medical examination without abnormal findings: Secondary | ICD-10-CM | POA: Diagnosis not present

## 2024-09-23 DIAGNOSIS — M9901 Segmental and somatic dysfunction of cervical region: Secondary | ICD-10-CM | POA: Diagnosis not present

## 2024-09-23 DIAGNOSIS — I10 Essential (primary) hypertension: Secondary | ICD-10-CM

## 2024-09-23 DIAGNOSIS — M25572 Pain in left ankle and joints of left foot: Secondary | ICD-10-CM | POA: Diagnosis not present

## 2024-09-23 DIAGNOSIS — M542 Cervicalgia: Secondary | ICD-10-CM | POA: Diagnosis not present

## 2024-09-23 DIAGNOSIS — M9906 Segmental and somatic dysfunction of lower extremity: Secondary | ICD-10-CM | POA: Diagnosis not present

## 2024-09-23 LAB — CBC WITH DIFFERENTIAL/PLATELET
Basophils Absolute: 0.1 K/uL (ref 0.0–0.1)
Basophils Relative: 1 % (ref 0.0–3.0)
Eosinophils Absolute: 0.1 K/uL (ref 0.0–0.7)
Eosinophils Relative: 1.6 % (ref 0.0–5.0)
HCT: 45.5 % (ref 39.0–52.0)
Hemoglobin: 15.6 g/dL (ref 13.0–17.0)
Lymphocytes Relative: 34.5 % (ref 12.0–46.0)
Lymphs Abs: 2.3 K/uL (ref 0.7–4.0)
MCHC: 34.4 g/dL (ref 30.0–36.0)
MCV: 90.8 fl (ref 78.0–100.0)
Monocytes Absolute: 0.6 K/uL (ref 0.1–1.0)
Monocytes Relative: 9 % (ref 3.0–12.0)
Neutro Abs: 3.6 K/uL (ref 1.4–7.7)
Neutrophils Relative %: 53.9 % (ref 43.0–77.0)
Platelets: 292 K/uL (ref 150.0–400.0)
RBC: 5.01 Mil/uL (ref 4.22–5.81)
RDW: 12.4 % (ref 11.5–15.5)
WBC: 6.7 K/uL (ref 4.0–10.5)

## 2024-09-23 LAB — PSA: PSA: 0.58 ng/mL (ref 0.10–4.00)

## 2024-09-23 LAB — COMPREHENSIVE METABOLIC PANEL WITH GFR
ALT: 19 U/L (ref 0–53)
AST: 17 U/L (ref 0–37)
Albumin: 4.3 g/dL (ref 3.5–5.2)
Alkaline Phosphatase: 54 U/L (ref 39–117)
BUN: 13 mg/dL (ref 6–23)
CO2: 31 meq/L (ref 19–32)
Calcium: 9.7 mg/dL (ref 8.4–10.5)
Chloride: 99 meq/L (ref 96–112)
Creatinine, Ser: 0.91 mg/dL (ref 0.40–1.50)
GFR: 90.1 mL/min (ref >60.00)
Glucose, Bld: 88 mg/dL (ref 70–99)
Potassium: 3.9 meq/L (ref 3.5–5.1)
Sodium: 137 meq/L (ref 135–145)
Total Bilirubin: 1 mg/dL (ref 0.2–1.2)
Total Protein: 6.9 g/dL (ref 6.0–8.3)

## 2024-09-23 LAB — LIPID PANEL
Cholesterol: 188 mg/dL (ref 0–200)
HDL: 43.7 mg/dL (ref >39.00)
LDL Cholesterol: 104 mg/dL — ABNORMAL HIGH (ref 0–99)
NonHDL: 144.44
Total CHOL/HDL Ratio: 4
Triglycerides: 201 mg/dL — ABNORMAL HIGH (ref 0.0–149.0)
VLDL: 40.2 mg/dL — ABNORMAL HIGH (ref 0.0–40.0)

## 2024-09-23 NOTE — Progress Notes (Signed)
 Complete physical exam  Patient: Christian Ramos   DOB: 08-23-1961   63 y.o. Male  MRN: [id number]  Subjective:    Chief Complaint  Patient presents with   Annual Exam    Patient presents today for physical exam.   Quality Metric Gaps    TDAP, pneumococcal vaccines    Christian Ramos is a 63 y.o. male who presents today for a complete physical exam. He reports consuming a general diet.  He generally feels well. He reports sleeping well. He does not have additional problems to discuss today.    Most recent fall risk assessment:    09/23/2024   11:19 AM  Fall Risk   Falls in the past year? 0  Number falls in past yr: 0  Injury with Fall? 0  Risk for fall due to : No Fall Risks  Follow up Falls evaluation completed     Most recent depression screenings:    09/23/2024   11:19 AM 07/15/2024    2:02 PM  PHQ 2/9 Scores  PHQ - 2 Score 0 0  PHQ- 9 Score 0 0      Data saved with a previous flowsheet row definition        Patient Care Team: Amon Aloysius BRAVO, MD as PCP - General (Internal Medicine) Burnard Debby LABOR, MD (Inactive) as PCP - Cardiology (Cardiology) Ladora Gunnar DEL., MD as Referring Physician (Gastroenterology) Skeet Juliene SAUNDERS, DO as Consulting Physician (Neurology) Goodrich, Callie E, PA-C as Physician Assistant (Cardiology)   Outpatient Medications Prior to Visit  Medication Sig   albuterol  (VENTOLIN  HFA) 108 (90 Base) MCG/ACT inhaler Inhale 2 puffs into the lungs every 6 (six) hours as needed for wheezing or shortness of breath.   chlorhexidine (PERIDEX) 0.12 % solution as needed.   eletriptan  (RELPAX ) 20 MG tablet Take 1 tablet (20 mg total) by mouth as needed for migraine or headache. May repeat in 2 hours if headache persists or recurs.  No more than 2 tablets in a 24-hour period.   fluconazole (DIFLUCAN) 200 MG tablet Take 200 mg by mouth 2 (two) times a week.   gabapentin  (NEURONTIN ) 100 MG capsule take 2 capsules at bedtime (Patient taking differently: as  needed. take 2 capsules at bedtime)   ipratropium (ATROVENT ) 0.03 % nasal spray Place 2 sprays into both nostrils every 12 (twelve) hours. (Patient taking differently: Place 2 sprays into both nostrils as needed.)   ketoconazole (NIZORAL) 2 % cream Apply topically as needed.   loratadine  (CLARITIN ) 10 MG tablet Take 10 mg by mouth daily as needed.   losartan -hydrochlorothiazide  (HYZAAR) 100-25 MG tablet Take 1 tablet by mouth daily.   metoprolol  succinate (TOPROL -XL) 50 MG 24 hr tablet Take 1 tablet (50 mg total) by mouth daily.   Multiple Vitamin (MULTIVITAMIN WITH MINERALS) TABS tablet Take 1 tablet by mouth daily.   mupirocin  ointment (BACTROBAN ) 2 % Apply 1 Application topically 2 (two) times daily. (Patient taking differently: Apply 1 Application topically as needed.)   semaglutide -weight management (WEGOVY ) 2.4 MG/0.75ML SOAJ SQ injection Inject 2.4 mg into the skin once a week.   sildenafil  (REVATIO ) 20 MG tablet Take 3-4 tablets (60-80 mg total) by mouth at bedtime as needed.   No facility-administered medications prior to visit.    Review of Systems  All other systems reviewed and are negative.         Objective:     BP 126/82   Pulse 76   Temp (!) 97.5 F (36.4  C)   Resp 16   Ht 5' 9 (1.753 m)   Wt 236 lb (107 kg)   SpO2 97%   BMI 34.85 kg/m    Physical Exam Vitals and nursing note reviewed.  Constitutional:      Appearance: Normal appearance.  HENT:     Head: Normocephalic and atraumatic.     Right Ear: Tympanic membrane, ear canal and external ear normal.     Left Ear: Tympanic membrane, ear canal and external ear normal.     Nose: Nose normal.     Mouth/Throat:     Mouth: Mucous membranes are moist.     Pharynx: Oropharynx is clear.  Eyes:     Extraocular Movements: Extraocular movements intact.     Conjunctiva/sclera: Conjunctivae normal.     Pupils: Pupils are equal, round, and reactive to light.  Cardiovascular:     Rate and Rhythm: Normal rate  and regular rhythm.     Pulses: Normal pulses.     Heart sounds: Normal heart sounds.  Pulmonary:     Effort: Pulmonary effort is normal.     Breath sounds: Normal breath sounds.  Abdominal:     General: Abdomen is flat. Bowel sounds are normal.     Palpations: Abdomen is soft.  Musculoskeletal:        General: Normal range of motion.     Cervical back: Normal range of motion and neck supple.  Skin:    General: Skin is warm and dry.  Neurological:     General: No focal deficit present.     Mental Status: He is alert and oriented to person, place, and time. Mental status is at baseline.  Psychiatric:        Mood and Affect: Mood normal.        Behavior: Behavior normal.        Thought Content: Thought content normal.        Judgment: Judgment normal.      No results found for any visits on 09/23/24.     Assessment & Plan:    Routine Health Maintenance and Physical Exam  Immunization History  Administered Date(s) Administered   PFIZER(Purple Top)SARS-COV-2 Vaccination 02/12/2020, 03/06/2020   Td 10/29/2002   Td (Adult),5 Lf Tetanus Toxid, Preservative Free 10/29/2002   Tdap 12/05/2012    Health Maintenance  Topic Date Due   Zoster Vaccines- Shingrix (1 of 2) 10/14/2024 (Originally 11/08/1980)   Influenza Vaccine  01/26/2025 (Originally 05/29/2024)   COVID-19 Vaccine (3 - Pfizer risk series) 04/30/2025 (Originally 04/03/2020)   DTaP/Tdap/Td (4 - Td or Tdap) 09/23/2025 (Originally 12/05/2022)   Pneumococcal Vaccine: 50+ Years (1 of 2 - PCV) 09/23/2025 (Originally 11/08/1980)   Colonoscopy  05/07/2025   Hepatitis C Screening  Completed   HIV Screening  Completed   Hepatitis B Vaccines 19-59 Average Risk  Aged Out   HPV VACCINES  Aged Out   Meningococcal B Vaccine  Aged Out    Discussed health benefits of physical activity, and encouraged him to engage in regular exercise appropriate for his age and condition.  Problem List Items Addressed This Visit     Morbid obesity  (HCC)   Hypertension   Relevant Orders   Comprehensive metabolic panel with GFR   Hyperlipidemia   Relevant Orders   Lipid panel   Annual physical exam - Primary   Relevant Orders   Lipid panel   Comprehensive metabolic panel with GFR   CBC with Differential/Platelet   Other Visit Diagnoses  Screening for malignant neoplasm of prostate       Relevant Orders   PSA      Return in about 6 months (around 03/23/2025).    Call the office with any questions or concerns.  Encouraged healthy diet, exercise, encouraged Tdap and pneumococcal vaccine patient currently declines.  Colonoscopy was performed 2 years ago and will repeat in 3 more years. Goldye Tourangeau B Dajsha Massaro, FNP

## 2024-09-23 NOTE — Telephone Encounter (Signed)
 Form completed during OV today. Copy given to Pt. Form faxed to [facility] at [phone]. Form sent for scanning.

## 2024-09-25 ENCOUNTER — Ambulatory Visit: Payer: Self-pay | Admitting: Family

## 2024-09-29 DIAGNOSIS — M542 Cervicalgia: Secondary | ICD-10-CM | POA: Diagnosis not present

## 2024-10-01 ENCOUNTER — Other Ambulatory Visit (HOSPITAL_COMMUNITY): Payer: Self-pay

## 2024-10-01 DIAGNOSIS — M9901 Segmental and somatic dysfunction of cervical region: Secondary | ICD-10-CM | POA: Diagnosis not present

## 2024-10-01 DIAGNOSIS — M9906 Segmental and somatic dysfunction of lower extremity: Secondary | ICD-10-CM | POA: Diagnosis not present

## 2024-10-01 DIAGNOSIS — M25572 Pain in left ankle and joints of left foot: Secondary | ICD-10-CM | POA: Diagnosis not present

## 2024-10-01 DIAGNOSIS — M542 Cervicalgia: Secondary | ICD-10-CM | POA: Diagnosis not present

## 2024-10-06 ENCOUNTER — Other Ambulatory Visit: Payer: Self-pay | Admitting: Neurology

## 2024-10-06 ENCOUNTER — Other Ambulatory Visit (HOSPITAL_BASED_OUTPATIENT_CLINIC_OR_DEPARTMENT_OTHER): Payer: Self-pay

## 2024-10-06 DIAGNOSIS — M9906 Segmental and somatic dysfunction of lower extremity: Secondary | ICD-10-CM | POA: Diagnosis not present

## 2024-10-06 DIAGNOSIS — M25572 Pain in left ankle and joints of left foot: Secondary | ICD-10-CM | POA: Diagnosis not present

## 2024-10-06 DIAGNOSIS — M9901 Segmental and somatic dysfunction of cervical region: Secondary | ICD-10-CM | POA: Diagnosis not present

## 2024-10-06 DIAGNOSIS — M542 Cervicalgia: Secondary | ICD-10-CM | POA: Diagnosis not present

## 2024-10-09 DIAGNOSIS — M9906 Segmental and somatic dysfunction of lower extremity: Secondary | ICD-10-CM | POA: Diagnosis not present

## 2024-10-09 DIAGNOSIS — M542 Cervicalgia: Secondary | ICD-10-CM | POA: Diagnosis not present

## 2024-10-09 DIAGNOSIS — M9901 Segmental and somatic dysfunction of cervical region: Secondary | ICD-10-CM | POA: Diagnosis not present

## 2024-10-09 DIAGNOSIS — M25572 Pain in left ankle and joints of left foot: Secondary | ICD-10-CM | POA: Diagnosis not present

## 2024-10-12 DIAGNOSIS — M9901 Segmental and somatic dysfunction of cervical region: Secondary | ICD-10-CM | POA: Diagnosis not present

## 2024-10-12 DIAGNOSIS — M542 Cervicalgia: Secondary | ICD-10-CM | POA: Diagnosis not present

## 2024-10-13 ENCOUNTER — Other Ambulatory Visit (HOSPITAL_COMMUNITY): Payer: Self-pay

## 2024-10-15 DIAGNOSIS — M542 Cervicalgia: Secondary | ICD-10-CM | POA: Diagnosis not present

## 2024-10-15 DIAGNOSIS — M9901 Segmental and somatic dysfunction of cervical region: Secondary | ICD-10-CM | POA: Diagnosis not present

## 2024-10-19 DIAGNOSIS — M9901 Segmental and somatic dysfunction of cervical region: Secondary | ICD-10-CM | POA: Diagnosis not present

## 2024-10-19 DIAGNOSIS — M542 Cervicalgia: Secondary | ICD-10-CM | POA: Diagnosis not present

## 2024-11-18 ENCOUNTER — Encounter: Admitting: Internal Medicine

## 2025-03-24 ENCOUNTER — Ambulatory Visit: Admitting: Internal Medicine

## 2025-09-27 ENCOUNTER — Encounter: Admitting: Internal Medicine
